# Patient Record
Sex: Female | Born: 1965 | Race: White | Hispanic: No | Marital: Married | Smoking: Current every day smoker
Health system: Southern US, Community
[De-identification: ages and names within clinical notes are randomized; demographics above are authoritative.]

## PROBLEM LIST (undated history)

## (undated) DIAGNOSIS — E282 Polycystic ovarian syndrome: Secondary | ICD-10-CM

## (undated) DIAGNOSIS — R002 Palpitations: Secondary | ICD-10-CM

## (undated) DIAGNOSIS — E119 Type 2 diabetes mellitus without complications: Secondary | ICD-10-CM

## (undated) DIAGNOSIS — J45909 Unspecified asthma, uncomplicated: Secondary | ICD-10-CM

## (undated) DIAGNOSIS — Z720 Tobacco use: Secondary | ICD-10-CM

## (undated) DIAGNOSIS — G4730 Sleep apnea, unspecified: Secondary | ICD-10-CM

## (undated) DIAGNOSIS — Z8673 Personal history of transient ischemic attack (TIA), and cerebral infarction without residual deficits: Secondary | ICD-10-CM

## (undated) HISTORY — PX: TUMOR REMOVAL: SHX12

## (undated) HISTORY — DX: Personal history of transient ischemic attack (TIA), and cerebral infarction without residual deficits: Z86.73

## (undated) HISTORY — DX: Palpitations: R00.2

## (undated) HISTORY — DX: Type 2 diabetes mellitus without complications: E11.9

## (undated) HISTORY — DX: Polycystic ovarian syndrome: E28.2

## (undated) HISTORY — PX: BREAST BIOPSY: SHX20

## (undated) HISTORY — PX: TUBAL LIGATION: SHX77

## (undated) HISTORY — DX: Tobacco use: Z72.0

---

## 2000-11-21 ENCOUNTER — Emergency Department (HOSPITAL_COMMUNITY): Admission: EM | Admit: 2000-11-21 | Discharge: 2000-11-21 | Payer: Self-pay | Admitting: Emergency Medicine

## 2004-11-11 ENCOUNTER — Other Ambulatory Visit: Admission: RE | Admit: 2004-11-11 | Discharge: 2004-11-11 | Payer: Self-pay | Admitting: Obstetrics and Gynecology

## 2010-01-14 ENCOUNTER — Encounter: Payer: Self-pay | Admitting: Cardiology

## 2010-03-08 ENCOUNTER — Encounter: Payer: Self-pay | Admitting: Family Medicine

## 2010-04-02 ENCOUNTER — Encounter: Payer: Self-pay | Admitting: Cardiology

## 2010-04-21 DIAGNOSIS — R51 Headache: Secondary | ICD-10-CM | POA: Insufficient documentation

## 2010-04-21 DIAGNOSIS — R5383 Other fatigue: Secondary | ICD-10-CM

## 2010-04-21 DIAGNOSIS — R61 Generalized hyperhidrosis: Secondary | ICD-10-CM | POA: Insufficient documentation

## 2010-04-21 DIAGNOSIS — R5381 Other malaise: Secondary | ICD-10-CM | POA: Insufficient documentation

## 2010-04-21 DIAGNOSIS — R0602 Shortness of breath: Secondary | ICD-10-CM | POA: Insufficient documentation

## 2010-04-21 DIAGNOSIS — R42 Dizziness and giddiness: Secondary | ICD-10-CM | POA: Insufficient documentation

## 2010-04-21 DIAGNOSIS — R7309 Other abnormal glucose: Secondary | ICD-10-CM | POA: Insufficient documentation

## 2010-04-21 DIAGNOSIS — R002 Palpitations: Secondary | ICD-10-CM | POA: Insufficient documentation

## 2010-04-22 ENCOUNTER — Other Ambulatory Visit: Payer: Self-pay | Admitting: Cardiology

## 2010-04-22 ENCOUNTER — Encounter: Payer: Self-pay | Admitting: Cardiology

## 2010-04-22 ENCOUNTER — Ambulatory Visit (INDEPENDENT_AMBULATORY_CARE_PROVIDER_SITE_OTHER): Payer: BC Managed Care – PPO | Admitting: Cardiology

## 2010-04-22 DIAGNOSIS — F17200 Nicotine dependence, unspecified, uncomplicated: Secondary | ICD-10-CM

## 2010-04-22 DIAGNOSIS — R002 Palpitations: Secondary | ICD-10-CM

## 2010-04-22 DIAGNOSIS — R0602 Shortness of breath: Secondary | ICD-10-CM

## 2010-04-22 LAB — BASIC METABOLIC PANEL
BUN: 18 mg/dL (ref 6–23)
Creatinine, Ser: 0.7 mg/dL (ref 0.4–1.2)
Glucose, Bld: 87 mg/dL (ref 70–99)
Potassium: 4.2 mEq/L (ref 3.5–5.1)

## 2010-04-28 NOTE — Assessment & Plan Note (Signed)
Summary: palpitations/ref grewal [phone] pt [phone]   Visit Type:  Initial Consult Primary Provider:  Jeani Hawking, MD  CC:  palpitations.  History of Present Illness: The patient presents for evaluation of palpitations. She reports this starting in mid February. She had had infrequent palpitations prior to this. However, on that day she started having them much more frequently. She seemed in some respects to feel it all the time described as a strong heartbeat in her chest. She alternately describes it as a flutter. She did not associate this with activities. She did not have any frank syncope though she did feel somewhat lightheaded. She has not had this sensation before. She did see Dr. Vincente Poli and had a normal TSH and CBC. She has had some midsternal discomfort that was vague but no left arm or neck discomfort. This was a mild full feeling that she would notice somewhat constantly. In the days prior to the symptoms she had been exercising routinely without bringing on the symptoms. She has decreased exercise tolerance. She has not been having any shortness of breath, PND or orthopnea. She has not been exercising routinely since then.  She reports over the last few days the symptoms seem to have abated though not completely.  Of note the patient has had no prior cardiac workup although she did have a workup to include an MRI for apparent TIAs.  Current Medications (verified): 1)  Byetta 5 Mcg Pen 5 Mcg/0.57ml Soln (Exenatide) .... Inject Two Times A Day 2)  Proantho 470-30 Mg Caps (Misc Natural Products) .... As Needed For Asthma 3)  Aspirin 81 Mg Tabs (Aspirin) .... Take One Tablet Once Daily  Allergies (verified): No Known Drug Allergies  Past History:  Past Medical History: Polycystic ovarian syndrome Tobacco abuse TIAs  Past Surgical History: Tubal ligation  Family History: No early onset heart disease.  Social History: Tobacco Use - Yes. (Ongoing x 20  years, 1ppd) Married   Review of Systems       Light headedness.  Negative for all other systems.  Vital Signs:  Patient profile:   45 year old female Height:      66 inches Weight:      155 pounds BMI:     25.11 Pulse rate:   79 / minute Pulse rhythm:   regular BP sitting:   114 / 75  (left arm) Cuff size:   regular  Vitals Entered By: Judithe Modest CMA (April 22, 2010 1:56 PM)  Physical Exam  General:  Well developed, well nourished, in no acute distress. Head:  normocephalic and atraumatic Eyes:  PERRLA/EOM intact; conjunctiva and lids normal. Mouth:  Teeth, gums and palate normal. Oral mucosa normal. Neck:  Neck supple, no JVD. No masses, thyromegaly or abnormal cervical nodes. Chest Wall:  no deformities or breast masses noted Lungs:  Clear bilaterally to auscultation and percussion. Abdomen:  Bowel sounds positive; abdomen soft and non-tender without masses, organomegaly, or hernias noted. No hepatosplenomegaly. Msk:  Back normal, normal gait. Muscle strength and tone normal. Extremities:  No clubbing or cyanosis. Neurologic:  Alert and oriented x 3. Skin:  Intact without lesions or rashes. Cervical Nodes:  no significant adenopathy Inguinal Nodes:  no significant adenopathy Psych:  Normal affect.   Detailed Cardiovascular Exam  Neck    Carotids: Carotids full and equal bilaterally without bruits.      Neck Veins: Normal, no JVD.    Heart    Inspection: no deformities or lifts noted.  Palpation: normal PMI with no thrills palpable.      Auscultation: regular rate and rhythm, S1, S2 without murmurs, rubs, gallops, or clicks.    Vascular    Abdominal Aorta: no palpable masses, pulsations, or audible bruits.      Femoral Pulses: normal femoral pulses bilaterally.      Pedal Pulses: normal pedal pulses bilaterally.      Radial Pulses: normal radial pulses bilaterally.      Peripheral Circulation: no clubbing, cyanosis, or edema noted with normal capillary  refill.     EKG  Procedure date:  04/22/2010  Findings:      sinus rhythm, rate 79, axis within normal limits, intervals within normal limits, no acute ST-T wave changes  Impression & Recommendations:  Problem # 1:  PALPITATIONS (ICD-785.1) The etiology of this is not clear. We discussed wearing an event monitor. However, she doesn't think the symptoms are frequent enough or that they last longer and off to necessarily be captured. Given her previous TIAs and this report I would like to check an echocardiogram to make sure she has a structurally normal heart. I will also add magnesium and basic metabolic profile to her labs. I will then see her back to further discuss any increasing palpitations. Orders: TLB-BMP (Basic Metabolic Panel-BMET) (80048-METABOL) TLB-Magnesium (Mg) (83735-MG) Echocardiogram (Echo)  Problem # 2:  TOBACCO ABUSE (ICD-305.1) She did stop smoking in the past with Chantix and tolerated this. Therefore, I gave her another prescription for this medication.  Patient Instructions: 1)  Your physician recommends that you schedule a follow-up appointment in: one month. 2)  Your physician has requested that you have an echocardiogram.  Echocardiography is a painless test that uses sound waves to create images of your heart. It provides your doctor with information about the size and shape of your heart and how well your heart's chambers and valves are working.  This procedure takes approximately one hour. There are no restrictions for this procedure. 3)  Your physician recommends that you return for lab work in today. BMET Magnesium level. Prescriptions: CHANTIX STARTING MONTH PAK 0.5 MG X 11 & 1 MG X 42 TABS (VARENICLINE TARTRATE) use as directed  #1 x 0   Entered by:   Ollen Gross, RN, BSN   Authorized by:   Rollene Rotunda, MD, [facility]   Signed by:   Ollen Gross, RN, BSN on 04/22/2010   Method used:   Electronically to        [facility] (retail)       [address]       [place], [place]  [address]       Ph: [phone]       Fax: [phone]   RxID:   [id number]  I have reviewed and approved all prescriptions at the time of this visit. Rollene Rotunda, MD, [facility]  April 22, 2010 3:58 PM

## 2010-05-01 ENCOUNTER — Ambulatory Visit (HOSPITAL_COMMUNITY): Payer: BC Managed Care – PPO | Attending: Cardiology

## 2010-05-01 ENCOUNTER — Other Ambulatory Visit: Payer: Self-pay | Admitting: Cardiology

## 2010-05-01 DIAGNOSIS — E119 Type 2 diabetes mellitus without complications: Secondary | ICD-10-CM | POA: Insufficient documentation

## 2010-05-01 DIAGNOSIS — Z8673 Personal history of transient ischemic attack (TIA), and cerebral infarction without residual deficits: Secondary | ICD-10-CM | POA: Insufficient documentation

## 2010-05-01 DIAGNOSIS — R42 Dizziness and giddiness: Secondary | ICD-10-CM | POA: Insufficient documentation

## 2010-05-01 DIAGNOSIS — R002 Palpitations: Secondary | ICD-10-CM | POA: Insufficient documentation

## 2010-05-05 NOTE — Consult Note (Signed)
Summary: [facility]  [facility]   Imported By: Marylou Mccoy 04/29/2010 08:15:18  _____________________________________________________________________  External Attachment:    Type:   Image     Comment:   External Document

## 2010-05-27 ENCOUNTER — Encounter: Payer: Self-pay | Admitting: Cardiology

## 2010-05-31 ENCOUNTER — Encounter: Payer: Self-pay | Admitting: Cardiology

## 2010-05-31 DIAGNOSIS — Z720 Tobacco use: Secondary | ICD-10-CM | POA: Insufficient documentation

## 2010-06-01 ENCOUNTER — Ambulatory Visit (INDEPENDENT_AMBULATORY_CARE_PROVIDER_SITE_OTHER): Payer: BC Managed Care – PPO | Admitting: Cardiology

## 2010-06-01 ENCOUNTER — Encounter: Payer: Self-pay | Admitting: Cardiology

## 2010-06-01 VITALS — BP 118/78 | HR 69 | Resp 18 | Ht 65.0 in | Wt 154.1 lb

## 2010-06-01 DIAGNOSIS — R002 Palpitations: Secondary | ICD-10-CM

## 2010-06-01 DIAGNOSIS — R0602 Shortness of breath: Secondary | ICD-10-CM

## 2010-06-01 DIAGNOSIS — F17200 Nicotine dependence, unspecified, uncomplicated: Secondary | ICD-10-CM

## 2010-06-01 MED ORDER — METOPROLOL TARTRATE 25 MG PO TABS: ORAL_TABLET | ORAL | Status: DC

## 2010-06-01 NOTE — Assessment & Plan Note (Signed)
I do note that she has a bronchodilator which is not wheezing and has not used this recently. She should tolerate a low dose of beta blocker.

## 2010-06-01 NOTE — Assessment & Plan Note (Signed)
She is taking Chantix with some success and I continued to encourage efforts toward complete abstinence.

## 2010-06-01 NOTE — Assessment & Plan Note (Signed)
We discussed symptomatic treatment. No further testing is indicated. I will give her metoprolol immediately release 12.5 mg to be taken as needed.

## 2010-06-01 NOTE — Progress Notes (Signed)
HPI The patient returns for followup of palpitations. Since I saw her I did check a chemistry and magnesium which were normal. Echo was normal. She still has the palpitations and has had and sometimes at night. She does relate them to stress. She denies any presyncope or syncope. She's not having any chest pressure, neck or arm discomfort. There is no new shortness of breath, PND or orthopnea. She said no weight gain or edema.  No Known Allergies  Current Outpatient Prescriptions  Medication Sig Dispense Refill  . albuterol (PROAIR HFA) 108 (90 BASE) MCG/ACT inhaler Inhale 2 puffs into the lungs every 6 (six) hours as needed.        [name] aspirin 81 MG tablet Take 81 mg by mouth daily.        [name] exenatide (BYETTA 5 MCG PEN) 5 MCG/0.02ML SOLN Inject into the skin 2 (two) times daily with a meal.        . Misc Natural Products (PROANTHO) 470-30 MG CAPS Take by mouth as needed.          Past Medical History  Diagnosis Date  . Polycystic ovarian disease   . History of TIAs   . Palpitations   . Tobacco abuse     Past Surgical History  Procedure Date  . Tubal ligation    ROS   As stated in the HPI and negative for all other systems.   PHYSICAL EXAM BP 118/78  Pulse 69  Resp 18  Ht 5\' 5"  (1.651 m)  Wt 154 lb 1.9 oz (69.908 kg)  BMI 25.65 kg/m2 GENERAL:  Well appearing HEENT:  Pupils equal round and reactive, fundi not visualized, oral mucosa unremarkable NECK:  No jugular venous distention, waveform within normal limits, carotid upstroke brisk and symmetric, no bruits, no thyromegaly LYMPHATICS:  No cervical, inguinal adenopathy LUNGS:  Clear to auscultation bilaterally BACK:  No CVA tenderness CHEST:  Unremarkable HEART:  PMI not displaced or sustained,S1 and S2 within normal limits, no S3, no S4, no clicks, no rubs, no murmurs ABD:  Flat, positive bowel sounds normal in frequency in pitch, no bruits, no rebound, no guarding, no midline pulsatile mass, no hepatomegaly, no  splenomegaly EXT:  2 plus pulses throughout, no edema, no cyanosis no clubbing SKIN:  No rashes no nodules NEURO:  Cranial nerves II through XII grossly intact, motor grossly intact throughout PSYCH:  Cognitively intact, oriented to person place and time  EKG:  Sinus rhythm, rate 70, axis within normal limits, RSR prime V1, no acute ST-T wave changes  ASSESSMENT AND PLAN

## 2010-06-01 NOTE — Patient Instructions (Addendum)
Your physician recommends that you schedule a follow-up appointment as needed.   Your physician has recommended you make the following change in your medication: START Metoprolol tartrate 25mg  take one-half tablet by mouth up to 4 times a day as needed for palpitations

## 2011-03-29 ENCOUNTER — Other Ambulatory Visit: Payer: Self-pay | Admitting: Obstetrics and Gynecology

## 2011-03-29 DIAGNOSIS — R928 Other abnormal and inconclusive findings on diagnostic imaging of breast: Secondary | ICD-10-CM

## 2011-03-30 ENCOUNTER — Ambulatory Visit
Admission: RE | Admit: 2011-03-30 | Discharge: 2011-03-30 | Disposition: A | Discharge status: Home / Self Care | Payer: BC Managed Care – PPO | Source: Ambulatory Visit | Attending: Obstetrics and Gynecology | Admitting: Obstetrics and Gynecology

## 2011-03-30 DIAGNOSIS — R928 Other abnormal and inconclusive findings on diagnostic imaging of breast: Secondary | ICD-10-CM

## 2011-04-28 ENCOUNTER — Ambulatory Visit (INDEPENDENT_AMBULATORY_CARE_PROVIDER_SITE_OTHER): Payer: BC Managed Care – PPO | Admitting: General Surgery

## 2014-05-28 ENCOUNTER — Other Ambulatory Visit: Payer: Self-pay | Admitting: Endocrinology

## 2014-05-28 DIAGNOSIS — E010 Iodine-deficiency related diffuse (endemic) goiter: Secondary | ICD-10-CM

## 2014-05-31 ENCOUNTER — Ambulatory Visit
Admission: RE | Admit: 2014-05-31 | Discharge: 2014-05-31 | Disposition: A | Discharge status: Home / Self Care | Payer: Managed Care, Other (non HMO) | Source: Ambulatory Visit | Attending: Endocrinology | Admitting: Endocrinology

## 2014-05-31 DIAGNOSIS — E010 Iodine-deficiency related diffuse (endemic) goiter: Secondary | ICD-10-CM

## 2014-06-04 ENCOUNTER — Other Ambulatory Visit: Payer: Self-pay | Admitting: Endocrinology

## 2014-06-05 ENCOUNTER — Other Ambulatory Visit: Payer: Self-pay | Admitting: Endocrinology

## 2014-06-05 DIAGNOSIS — E010 Iodine-deficiency related diffuse (endemic) goiter: Secondary | ICD-10-CM

## 2014-06-05 DIAGNOSIS — E041 Nontoxic single thyroid nodule: Secondary | ICD-10-CM

## 2014-06-06 ENCOUNTER — Other Ambulatory Visit (HOSPITAL_COMMUNITY)
Admission: RE | Admit: 2014-06-06 | Discharge: 2014-06-06 | Disposition: A | Discharge status: Home / Self Care | Payer: Managed Care, Other (non HMO) | Source: Ambulatory Visit | Attending: Interventional Radiology | Admitting: Interventional Radiology

## 2014-06-06 ENCOUNTER — Ambulatory Visit
Admission: RE | Admit: 2014-06-06 | Discharge: 2014-06-06 | Disposition: A | Discharge status: Home / Self Care | Payer: Managed Care, Other (non HMO) | Source: Ambulatory Visit | Attending: Endocrinology | Admitting: Endocrinology

## 2014-06-06 DIAGNOSIS — E041 Nontoxic single thyroid nodule: Secondary | ICD-10-CM | POA: Insufficient documentation

## 2014-06-06 DIAGNOSIS — E010 Iodine-deficiency related diffuse (endemic) goiter: Secondary | ICD-10-CM

## 2014-08-13 ENCOUNTER — Other Ambulatory Visit: Payer: Self-pay | Admitting: Endocrinology

## 2014-08-13 DIAGNOSIS — E049 Nontoxic goiter, unspecified: Secondary | ICD-10-CM

## 2015-01-24 ENCOUNTER — Other Ambulatory Visit: Payer: Self-pay | Admitting: Endocrinology

## 2015-01-24 DIAGNOSIS — D3501 Benign neoplasm of right adrenal gland: Secondary | ICD-10-CM

## 2015-01-29 ENCOUNTER — Other Ambulatory Visit: Payer: Managed Care, Other (non HMO)

## 2015-02-12 ENCOUNTER — Ambulatory Visit
Admission: RE | Admit: 2015-02-12 | Discharge: 2015-02-12 | Disposition: A | Discharge status: Home / Self Care | Payer: Managed Care, Other (non HMO) | Source: Ambulatory Visit | Attending: Endocrinology | Admitting: Endocrinology

## 2015-02-12 DIAGNOSIS — D3501 Benign neoplasm of right adrenal gland: Secondary | ICD-10-CM

## 2015-02-12 DIAGNOSIS — E049 Nontoxic goiter, unspecified: Secondary | ICD-10-CM

## 2015-02-18 ENCOUNTER — Other Ambulatory Visit: Payer: Self-pay | Admitting: Endocrinology

## 2015-02-18 DIAGNOSIS — E049 Nontoxic goiter, unspecified: Secondary | ICD-10-CM

## 2016-02-13 ENCOUNTER — Ambulatory Visit
Admission: RE | Admit: 2016-02-13 | Discharge: 2016-02-13 | Disposition: A | Discharge status: Home / Self Care | Payer: Managed Care, Other (non HMO) | Source: Ambulatory Visit | Attending: Endocrinology | Admitting: Endocrinology

## 2016-02-13 DIAGNOSIS — E049 Nontoxic goiter, unspecified: Secondary | ICD-10-CM

## 2016-03-02 ENCOUNTER — Telehealth: Payer: Managed Care, Other (non HMO) | Admitting: Family

## 2016-03-02 DIAGNOSIS — R05 Cough: Secondary | ICD-10-CM

## 2016-03-02 DIAGNOSIS — J209 Acute bronchitis, unspecified: Secondary | ICD-10-CM

## 2016-03-02 DIAGNOSIS — R059 Cough, unspecified: Secondary | ICD-10-CM

## 2016-03-02 MED ORDER — BENZONATATE 100 MG PO CAPS: 100.0000 mg | ORAL_CAPSULE | Freq: Two times a day (BID) | ORAL | 0 refills | Status: DC | PRN

## 2016-03-02 MED ORDER — ALBUTEROL SULFATE HFA 108 (90 BASE) MCG/ACT IN AERS: 2.0000 | INHALATION_SPRAY | Freq: Four times a day (QID) | RESPIRATORY_TRACT | 1 refills | Status: DC | PRN

## 2016-03-02 NOTE — Addendum Note (Signed)
Addended by: Chevis Pretty on: 03/02/2016 05:28 PM   Modules accepted: Orders

## 2016-03-02 NOTE — Progress Notes (Signed)
We are sorry that you are not feeling well.  Here is how we plan to help!  Based on what you have shared with me it looks like you have upper respiratory tract inflammation that has resulted in a significant cough.  Inflammation and infection in the upper respiratory tract is commonly called bronchitis and has four common causes:  Allergies, Viral Infections, Acid Reflux and Bacterial Infections.  Allergies, viruses and acid reflux are treated by controlling symptoms or eliminating the cause. An example might be a cough caused by taking certain blood pressure medications. You stop the cough by changing the medication. Another example might be a cough caused by acid reflux. Controlling the reflux helps control the cough.  Based on your presentation I believe you most likely have A cough due to a virus.  This is called viral bronchitis and is best treated by rest, plenty of fluids and control of the cough.  You may use Ibuprofen or Tylenol as directed to help your symptoms.     In addition you may use A prescription cough medication called Tessalon Perles 100mg. You may take 1-2 capsules every 8 hours as needed for your cough.  USE OF BRONCHODILATOR ("RESCUE") INHALERS: There is a risk from using your bronchodilator too frequently.  The risk is that over-reliance on a medication which only relaxes the muscles surrounding the breathing tubes can reduce the effectiveness of medications prescribed to reduce swelling and congestion of the tubes themselves.  Although you feel brief relief from the bronchodilator inhaler, your asthma may actually be worsening with the tubes becoming more swollen and filled with mucus.  This can delay other crucial treatments, such as oral steroid medications. If you need to use a bronchodilator inhaler daily, several times per day, you should discuss this with your provider.  There are probably better treatments that could be used to keep your asthma under control.     HOME  CARE . Only take medications as instructed by your medical team. . Complete the entire course of an antibiotic. . Drink plenty of fluids and get plenty of rest. . Avoid close contacts especially the very young and the elderly . Cover your mouth if you cough or cough into your sleeve. . Always remember to wash your hands . A steam or ultrasonic humidifier can help congestion.   GET HELP RIGHT AWAY IF: . You develop worsening fever. . You become short of breath . You cough up blood. . Your symptoms persist after you have completed your treatment plan MAKE SURE YOU   Understand these instructions.  Will watch your condition.  Will get help right away if you are not doing well or get worse.  Your e-visit answers were reviewed by a board certified advanced clinical practitioner to complete your personal care plan.  Depending on the condition, your plan could have included both over the counter or prescription medications. If there is a problem please reply  once you have received a response from your provider. Your safety is important to us.  If you have drug allergies check your prescription carefully.    You can use MyChart to ask questions about today's visit, request a non-urgent call back, or ask for a work or school excuse for 24 hours related to this e-Visit. If it has been greater than 24 hours you will need to follow up with your provider, or enter a new e-Visit to address those concerns. You will get an e-mail in the next two days   asking about your experience.  I hope that your e-visit has been valuable and will speed your recovery. Thank you for using e-visits.   

## 2016-11-19 ENCOUNTER — Ambulatory Visit (INDEPENDENT_AMBULATORY_CARE_PROVIDER_SITE_OTHER): Payer: BLUE CROSS/BLUE SHIELD | Admitting: Family Medicine

## 2016-11-19 ENCOUNTER — Encounter: Payer: Self-pay | Admitting: Family Medicine

## 2016-11-19 VITALS — BP 120/82 | HR 77 | Temp 98.0°F | Resp 16 | Ht 66.0 in | Wt 180.0 lb

## 2016-11-19 DIAGNOSIS — M799 Soft tissue disorder, unspecified: Secondary | ICD-10-CM | POA: Diagnosis not present

## 2016-11-19 DIAGNOSIS — E1165 Type 2 diabetes mellitus with hyperglycemia: Secondary | ICD-10-CM

## 2016-11-19 DIAGNOSIS — F419 Anxiety disorder, unspecified: Secondary | ICD-10-CM | POA: Diagnosis not present

## 2016-11-19 DIAGNOSIS — J4521 Mild intermittent asthma with (acute) exacerbation: Secondary | ICD-10-CM | POA: Diagnosis not present

## 2016-11-19 DIAGNOSIS — J209 Acute bronchitis, unspecified: Secondary | ICD-10-CM

## 2016-11-19 DIAGNOSIS — M7989 Other specified soft tissue disorders: Secondary | ICD-10-CM

## 2016-11-19 MED ORDER — ALBUTEROL SULFATE HFA 108 (90 BASE) MCG/ACT IN AERS: 2.0000 | INHALATION_SPRAY | Freq: Four times a day (QID) | RESPIRATORY_TRACT | 1 refills | Status: DC | PRN

## 2016-11-19 MED ORDER — CITALOPRAM HYDROBROMIDE 20 MG PO TABS: 20.0000 mg | ORAL_TABLET | Freq: Every day | ORAL | 0 refills | Status: DC

## 2016-11-19 MED ORDER — AMOXICILLIN-POT CLAVULANATE 875-125 MG PO TABS: 1.0000 | ORAL_TABLET | Freq: Two times a day (BID) | ORAL | 0 refills | Status: DC

## 2016-11-19 MED ORDER — METFORMIN HCL 500 MG PO TABS
500.0000 mg | ORAL_TABLET | Freq: Two times a day (BID) | ORAL | 0 refills | Status: DC
Start: 2016-11-19 — End: 2017-05-07

## 2016-11-19 NOTE — Patient Instructions (Signed)
Celexa (for anxiety) citalapram 20 mg tablet, take 1/2 a day for 2 weeks and then increase to 1 a day if still have anxiety   Metformin 500 mg, take 1 pill each morning for 2 weeks then increase to 1 in the morning and 1 in the evening TAKE WITH FOOD

## 2016-11-19 NOTE — Progress Notes (Signed)
Patient ID: Gwendolyn [name], female    DOB: Mar 22, 1965, 51 y.o.   MRN: [id number]  Chief Complaint  Patient presents with  . Cyst    knot on leg, left thigh  . Other    allergies, URI?  [name] Anxiety    Allergies Patient has no known allergies.  Subjective:   Gwendolyn [name] is a 51 y.o. female who presents to [facility] today.  HPI Here as a new patient to establish care. Has several concerns today that she would like addressed.  Area on her left upper thigh for the past several weeks that she is concerned about. Reports that she first noticed the area was a bit sore. Reports that the area is achy in quality and nothing seems to make it better or worse. Patient reports that she feels like the area is enlarged and it hurts at times, but not constant. Pain is a 3-4 out of 10. Patient reports that she can walk normal and has good strength in her leg. Denies any numbness or tingling in the leg or thigh. Denies any trauma to the area. Has never had this in the past. Has not taken any medications or sought any treatment for this. Nothing seems to make it better or worse. The area has not gotten larger or smaller over the past couple weeks. Patient reports that she is worried that it could be a blood clot.  Patient reports that she has had a cough for the past 2-3 weeks. Cough is productive of greenish sputum. Reports that she feels congested in the mornings and the mucus makes her feel nauseated. Reports that she feels winded at times over the past couple weeks with exertion and has a history of asthma. Has been out of her inhaler. Usually does not need to use the inhaler unless has an upper respiratory infection.  Reports that she coughs frequently. Can tell that she is wheezing at times. Has had postnasal drip and bilateral sinus pain. Feels like she might have fevers at times, but has not checked her temperature. Has not been on any recent antibiotics. Patient denies any shortness  of breath at this time. Energy has been somewhat low. Denies weight loss. Patient has a smoking history. She is currently not ready to quit smoking.  Patient reports that over the past 6 months she has been struggling with anxiety. Reports she has never been anxious in her life until about 6 months ago. Reports many years ago was put on Prozac for her mood but then decided she did not want to take it. Patient reports she has had [place] amount of stress at work and is currently looking for a new job location. Reports that works in a stressful environment and does not get along with all of her coworkers. Reports that feels anxious and on edge most of the day. Can get tearful and very emotional at times. Not feel sad. Denies any suicidal or homicidal ideations. Denies any history of previous suicidal ideations. Reports that she has trouble sleeping at times. Reports that occasionally will have some wine at night to help her sleep and to feel more relaxed. Reports she has good social support. Reports that he does not feel depressed but does feel very anxious. Denies any drug use.  Patient reports that she has a history of diabetes type 2 but has not been on her medications for many months now. Reports that she was seen by her gynecologist and referred  to an endocrinologist. She believes her highest A1c was 8.0%. She reports that she was told she had polycystic ovarian syndrome and that metformin was not working well for her. She only used it for a very short time and then was placed on Byetta injections. Patient reports that she is unable to afford the Byetta and needs a much cheaper option. Patient reports that she is willing to work on her diet and exercise. Reports last A1c was around 6.5%. Denies any diabetic complications related to nerve or eyes. Denies any lesions on her feet. At this time would like to be followed here for diabetes because does not want to drive back and forth to [place]. Denies any  hypoglycemic episodes. Reports that she has not been on any cholesterol or blood pressure medications in the past.  No known drug allergies.     Past Medical History:  Diagnosis Date  . Diabetes mellitus without complication ([place])   . History of TIAs   . Palpitations   . Polycystic ovarian disease   . Tobacco abuse     Past Surgical History:  Procedure Laterality Date  . TUBAL LIGATION      Family History  Problem Relation Age of Onset  . Hypertension Mother   . Diabetes Father   . Diabetes Brother      Social History   Social History  . Marital status: Divorced    Spouse name: N/A  . Number of children: N/A  . Years of education: N/A   Social History Main Topics  . Smoking status: Current Every Day Smoker    Packs/day: 0.25    Years: 20.00  . Smokeless tobacco: Never Used  . Alcohol use 3.0 oz/week    5 Glasses of wine per week  . Drug use: No  . Sexual activity: Not Currently    Birth control/ protection: Post-menopausal   Other Topics Concern  . None   Social History Narrative  . None    Review of Systems  Constitutional: Positive for fatigue and fever. Negative for activity change and appetite change.  HENT: Positive for sinus pain and sinus pressure. Negative for dental problem, ear pain, facial swelling, sneezing, sore throat, trouble swallowing and voice change.   Eyes: Negative for visual disturbance.  Respiratory: Positive for cough and chest tightness. Negative for shortness of breath.        Denies any current chest tightness or shortness of breath.  Cardiovascular: Negative for chest pain, palpitations and leg swelling.  Gastrointestinal: Negative for abdominal pain, nausea and vomiting.  Genitourinary: Negative for dysuria, frequency and urgency.  Musculoskeletal: Negative for back pain, gait problem and joint swelling.       Episodic pain in left upper thigh region.  Neurological: Negative for dizziness, seizures, syncope, speech  difficulty, light-headedness and numbness.  Hematological: Negative for adenopathy.  Psychiatric/Behavioral: Positive for agitation, dysphoric mood and sleep disturbance. Negative for confusion, decreased concentration, hallucinations, self-injury and suicidal ideas. The patient is nervous/anxious.      Objective:   BP 120/82 (BP Location: Left Arm, Patient Position: Sitting, Cuff Size: Normal)   Pulse 77   Temp 98 F (36.7 C) (Other (Comment))   Resp 16   Ht 5\' 6"  (1.676 m)   Wt 180 lb (81.6 kg)   LMP 03/30/2011   SpO2 98%   BMI 29.05 kg/m   Physical Exam  Constitutional: She is oriented to person, place, and time. She appears well-developed and well-nourished. No distress.  HENT:  Head:  Normocephalic and atraumatic.  Mouth/Throat: Uvula is midline, oropharynx is clear and moist and mucous membranes are normal. Mucous membranes are not pale and not dry. No oral lesions. No posterior oropharyngeal edema or posterior oropharyngeal erythema.  Eyes: Pupils are equal, round, and reactive to light. Conjunctivae and lids are normal. Right conjunctiva is not injected. Left conjunctiva is not injected.  Neck: Normal range of motion. Neck supple. No neck rigidity. No edema and normal range of motion present. No thyromegaly present.  Cardiovascular: Normal rate, regular rhythm, normal heart sounds and normal pulses.   Pulmonary/Chest: Effort normal. No respiratory distress. She has no decreased breath sounds. She has wheezes in the right lower field and the left lower field. She has rhonchi in the right lower field, the left middle field and the left lower field.  Musculoskeletal:       Left upper leg: She exhibits tenderness. She exhibits no swelling, no edema and no laceration.       Legs: Approximate 10 cm palpable lesion in left upper medial thigh with indiscriminate borders. Tender slightly to palpation. No skin dimpling. No associated skin erythema or induration.  Neurological: She is  alert and oriented to person, place, and time. She has normal strength. She displays no atrophy. No cranial nerve deficit or sensory deficit. She exhibits normal muscle tone.  Reflex Scores:      Patellar reflexes are 2+ on the right side and 2+ on the left side. Skin: Skin is warm and dry. No abrasion, no bruising, no ecchymosis and no rash noted.  Psychiatric: Her speech is normal and behavior is normal. Judgment and thought content normal. Her mood appears anxious. Her affect is not inappropriate. Cognition and memory are normal. She does not exhibit a depressed mood. She expresses no homicidal and no suicidal ideation. She expresses no suicidal plans and no homicidal plans.  Nursing note and vitals reviewed.    Assessment and Plan   1. Anxiety, uncontrolled New-onset, suspect exacerbated by work stressors. Discussed medication options in detail. Discussed risks versus benefits of medications. We discussed the side effects. Patient is agreeable and would like to start the medication. We'll start at Celexa 10 mg by mouth daily for 2 weeks and then increase to 1 tablet by mouth daily. Patient told to call with any questions or concerned. Patient denies any suicidal ideations. - citalopram (CELEXA) 20 MG tablet; Take 1 tablet (20 mg total) by mouth daily.  Dispense: 30 tablet; Refill: 0 Suicide risks evaluated and documented in note if present or in the area below.  Patient does not have/denies the following risks: previous suicide attempts, family history of suicide, access to lethal means, prior history of psychiatric disorder, history of alcohol or substance abuse disorder, recent loss of a loved one, or severe hopelessness.Patient has protective factors of family and community support.  Patient reports that family believes is behaving rationally. Patient displays problem solving skills.   Patient specifically denies suicide ideation. Patient has access/information to healthcare contacts if  situation or mood changes where patient is a risk to self or others or mood becomes unstable.   During the encounter, the patient had good eye contact and firm handshake regarding safety contract and agreement to seek help if mood worsens and not to harm self.  Discussed relaxation techniques. Discussed sleep schedule and ways to improve sleep hygiene. Exercise recommended after resolution of bronchitis episode.  2. Soft tissue mass Uncertain etiology. Will start with ultrasound of the area and pending results  of study we'll either refer for evaluation or obtain MRI. Patient counseled that this area was not a venous thrombosis. All of her questions were answered. - US SOFT TISSUE UPPER EXTREMITY LIMITED LEFT (NON-VASCULAR); Future  3. Type 2 diabetes mellitus with hyperglycemia, without long-term current use of insulin (HCC) Larene Beach has been off of medications for extended duration of time due to financial constraints. At this time will discontinue Byetta and start metformin. Patient will titrate up metformin as directed. She will start taking metformin 500 mg 1 each day for 2 weeks and then increase to twice a day. Patient counseled concerning the risks of this medication. Diabetic diet recommended. Patient defers nutritionist at this time. - metFORMIN (GLUCOPHAGE) 500 MG tablet; Take 1 tablet (500 mg total) by mouth 2 (two) times daily with a meal.  Dispense: 180 tablet; Refill: 0 - Basic metabolic panel - Hemoglobin A1c Check labs today. We discussed that a follow-up in later office visits that patient will need lipid tests, foot exam, routine diabetic eye exam, and it is recommended for her to lose weight. 4. Mild intermittent asthma with acute exacerbation Patient counseled concerning worrisome signs of breathing problems. She will use her inhaler as directed and keep her follow-up. Smoking cessation recommended - albuterol (PROAIR HFA) 108 (90 Base) MCG/ACT inhaler; Inhale 2 puffs into the  lungs every 6 (six) hours as needed.  Dispense: 1 Inhaler; Refill: 1  5. Acute bronchitis, unspecified organism Patient encouraged to take the antibiotic as directed. She was counseled concerning worrisome signs of worsening infection. Told that if symptoms had not improved  that she needed to follow-up in 1-2 weeks or sooner if needed. Patient has agreed that until she completes the antibiotic she will not start the Celexa for her mood. - amoxicillin-clavulanate (AUGMENTIN) 875-125 MG tablet; Take 1 tablet by mouth 2 (two) times daily.  Dispense: 20 tablet; Refill: 0 . Patient was given a note today saying that she should not take the flu vaccine until this medical condition resolves. In addition it is recommended that patient not return to work today but go home and rest. Her skin chart.  No Follow-up on file. Caren Macadam, MD 11/19/2016

## 2016-11-24 ENCOUNTER — Telehealth: Payer: Self-pay | Admitting: Family Medicine

## 2016-11-24 ENCOUNTER — Telehealth: Payer: Self-pay

## 2016-11-24 NOTE — Telephone Encounter (Signed)
She needs to be seen, have her come at 1400 today if possible

## 2016-11-24 NOTE — Telephone Encounter (Signed)
Labs ordered can go have done.

## 2016-11-24 NOTE — Telephone Encounter (Signed)
Patient requesting steroid.  She feels the antibiotic that she has been since this past Friday is not working.  Please call in @ [facility].    Pt is scheduled for [place] @ [facility] tomorrow, but she has not heard anything about her lab work.  She states Dr Mannie Stabile had mentioned A1C.

## 2016-11-24 NOTE — Telephone Encounter (Signed)
Please advise patient that is she is not feeling better that she needs to be seen and evaluated. I do not call in steroids without evaluating a patient. Steroids are serious medications. Advise that labs were ordered the day that she was here and she was supposed to get them that day. She can come in for the labs. Gwen Her. Mannie Stabile, MD

## 2016-11-25 ENCOUNTER — Ambulatory Visit (HOSPITAL_COMMUNITY)
Admission: RE | Admit: 2016-11-25 | Discharge: 2016-11-25 | Disposition: A | Discharge status: Home / Self Care | Payer: BLUE CROSS/BLUE SHIELD | Source: Ambulatory Visit | Attending: Family Medicine | Admitting: Family Medicine

## 2016-11-25 DIAGNOSIS — M799 Soft tissue disorder, unspecified: Secondary | ICD-10-CM | POA: Insufficient documentation

## 2016-11-25 DIAGNOSIS — M7989 Other specified soft tissue disorders: Secondary | ICD-10-CM

## 2016-11-25 NOTE — Telephone Encounter (Signed)
Patient informed of message below, verbalized understanding.  

## 2016-11-29 ENCOUNTER — Telehealth: Payer: Self-pay | Admitting: Family Medicine

## 2016-11-29 NOTE — Telephone Encounter (Signed)
-----   Message from Caren Macadam, MD sent at 11/26/2016  8:59 AM EDT ----- Please call the patient and advise that the ultrasound did note reveal any cystic or solid abnormality. They did not identify any mass lesion. Please advise that we will discuss at her follow up whether we refer her to specialist or proceed with MRI. I would recommend MRI. If she would like to proceed with that prior to her visit, let me know and we can order. Gwen Her. Mannie Stabile, MD

## 2016-11-29 NOTE — Telephone Encounter (Signed)
Called patient regarding message below. No answer, left generic message for patient to return call.   

## 2016-12-01 ENCOUNTER — Ambulatory Visit: Payer: BLUE CROSS/BLUE SHIELD | Admitting: Family Medicine

## 2016-12-05 IMAGING — US US SOFT TISSUE HEAD/NECK
1 series · 14 of 25 positions shown · non-contrast
Comparison: 05/31/2014.  Biopsies from 06/06/2014

CLINICAL DATA: Follow-up of thyroid goiter and nodules.

EXAM:
THYROID ULTRASOUND
TECHNIQUE: Ultrasound examination of the thyroid gland and adjacent soft
tissues was performed.

[Series 1: us soft tissue head/neck · 0.08mm/px · 14 of 50 slices shown]
[im 1/50]
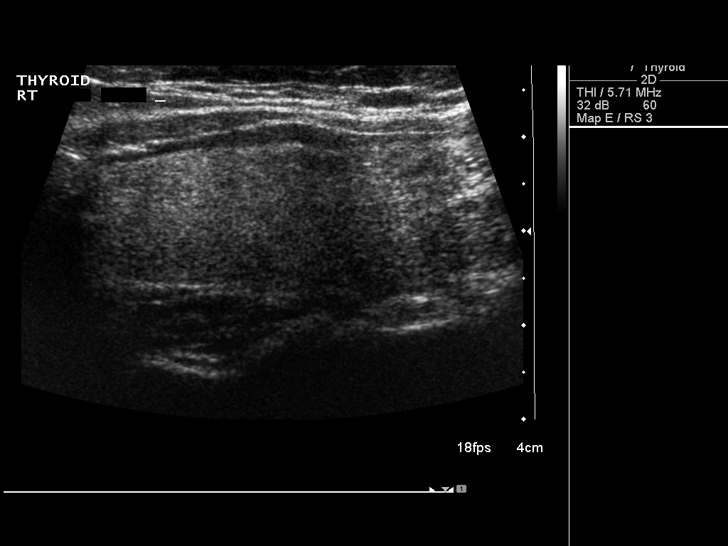
[im 5/50]
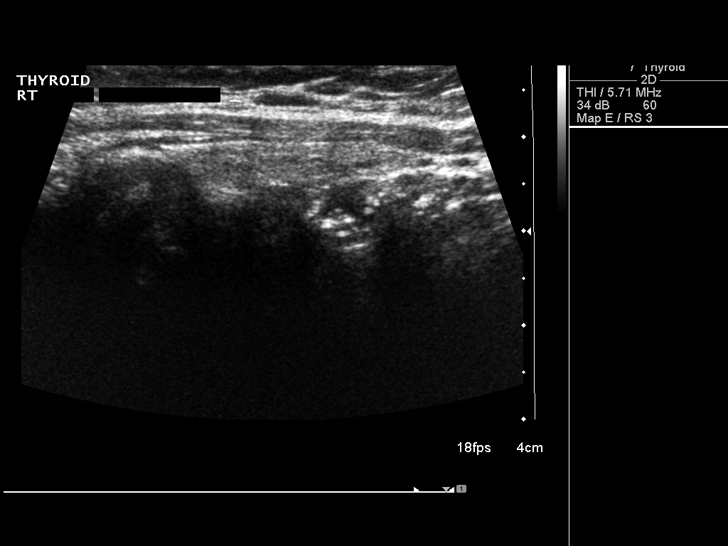
[im 9/50]
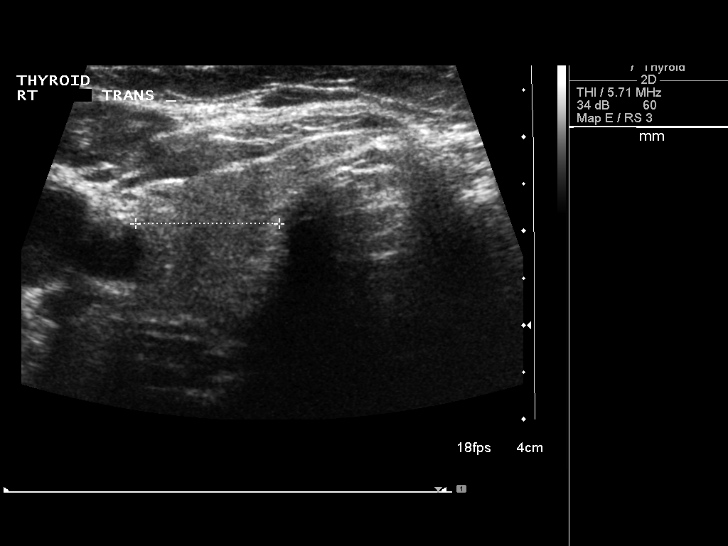
[im 13/50]
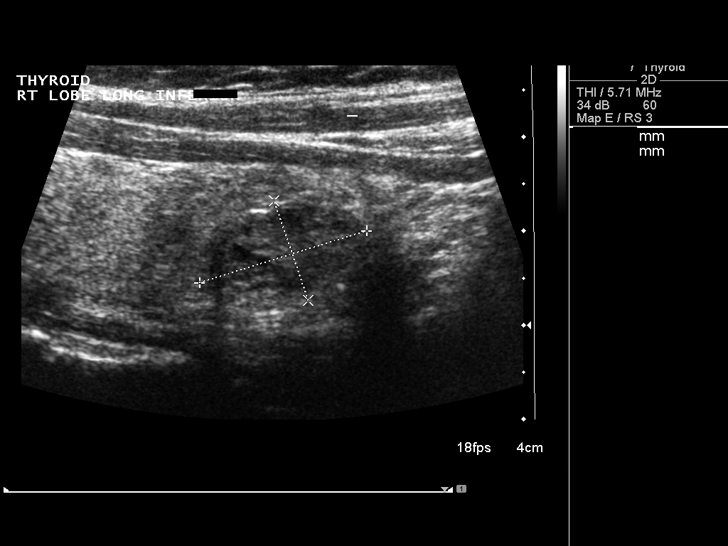
[im 17/50]
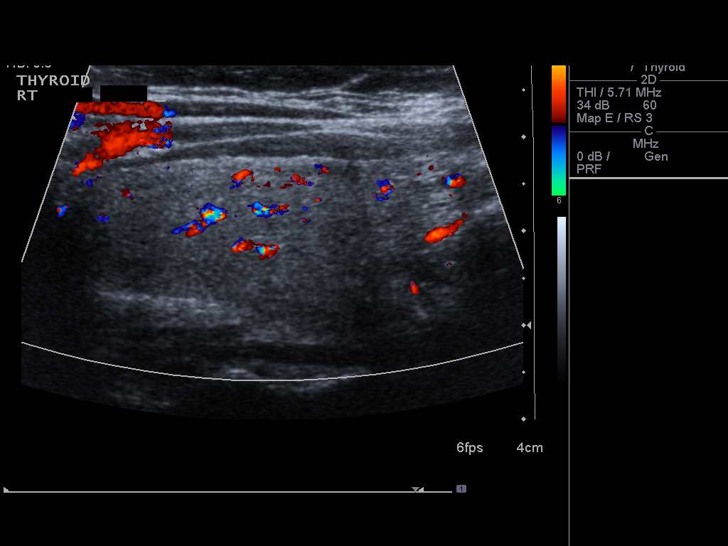
[im 19/50]
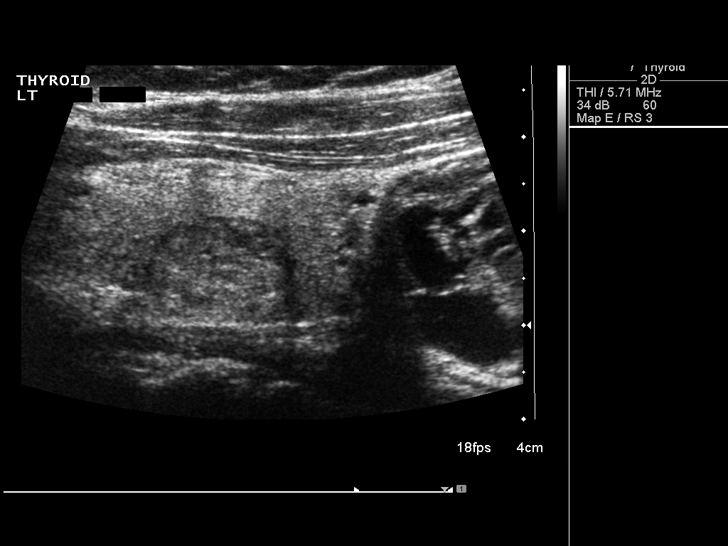
[im 23/50]
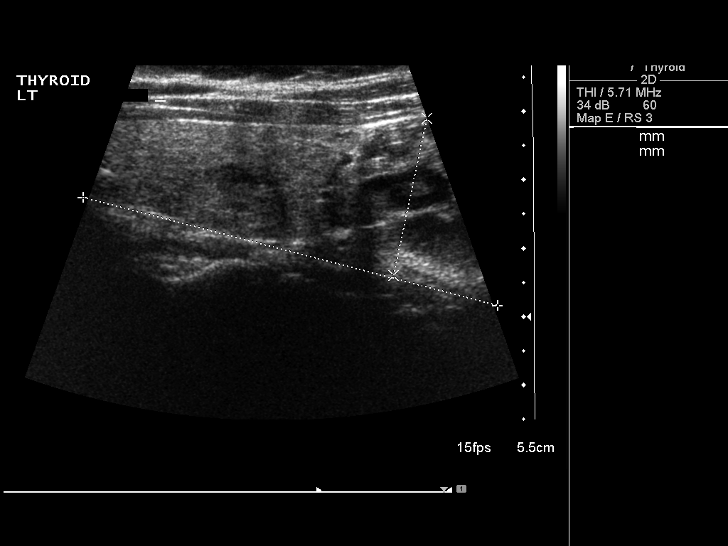
[im 27/50]
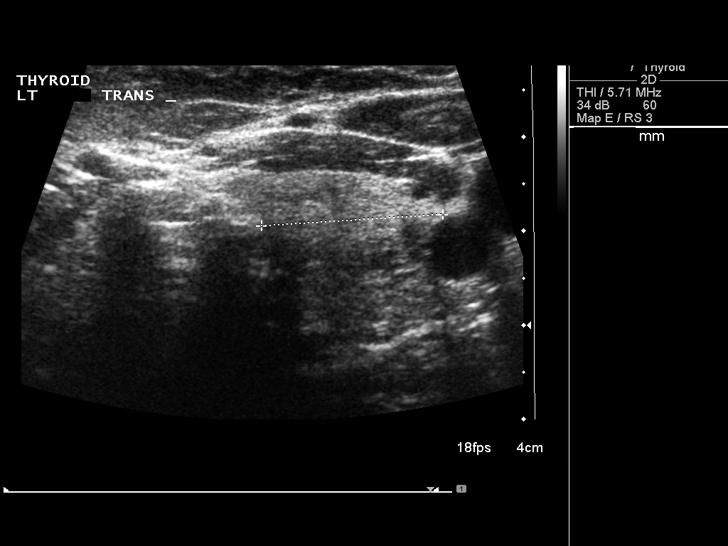
[im 31/50]
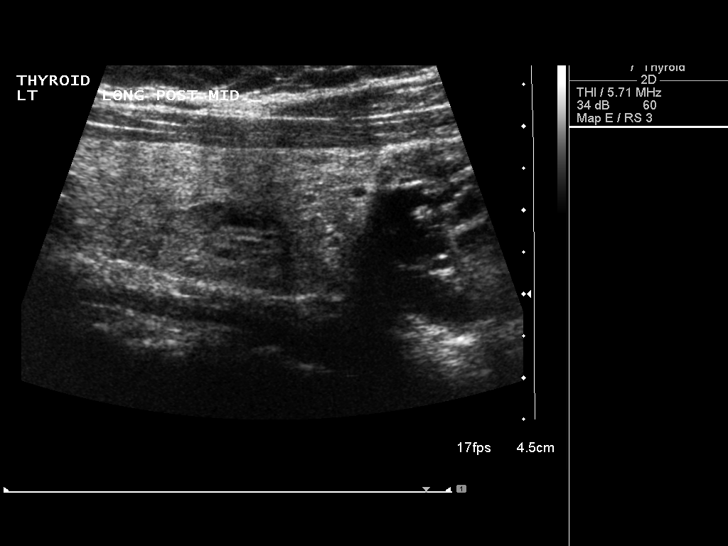
[im 33/50]
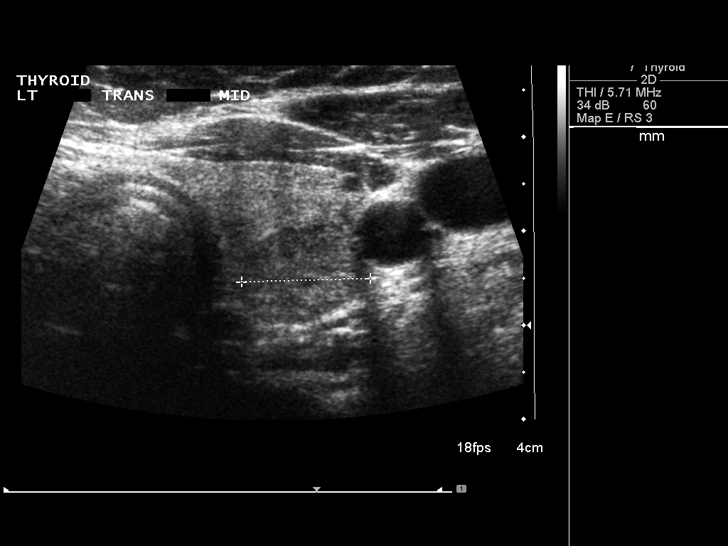
[im 37/50]
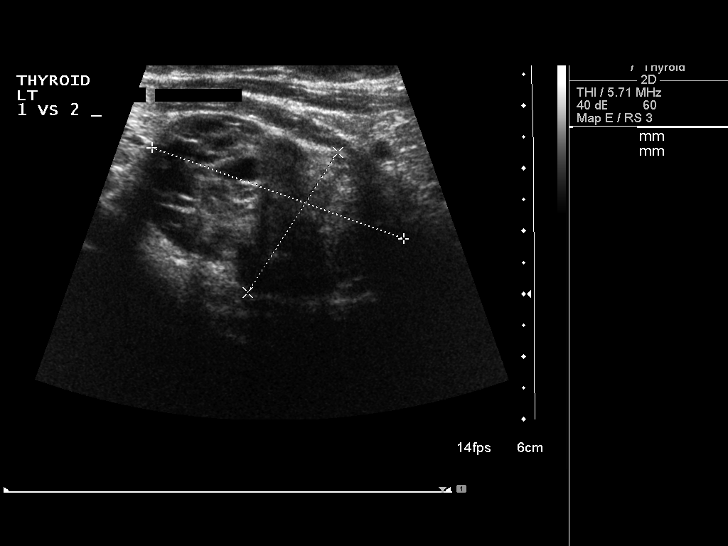
[im 41/50]
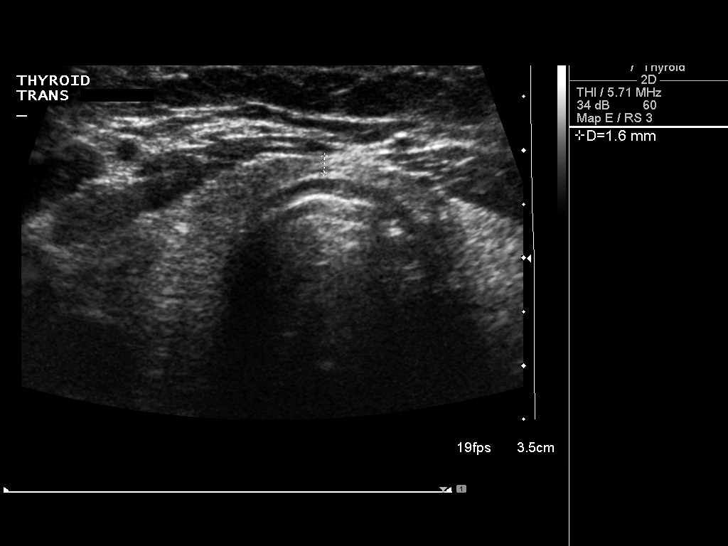
[im 45/50]
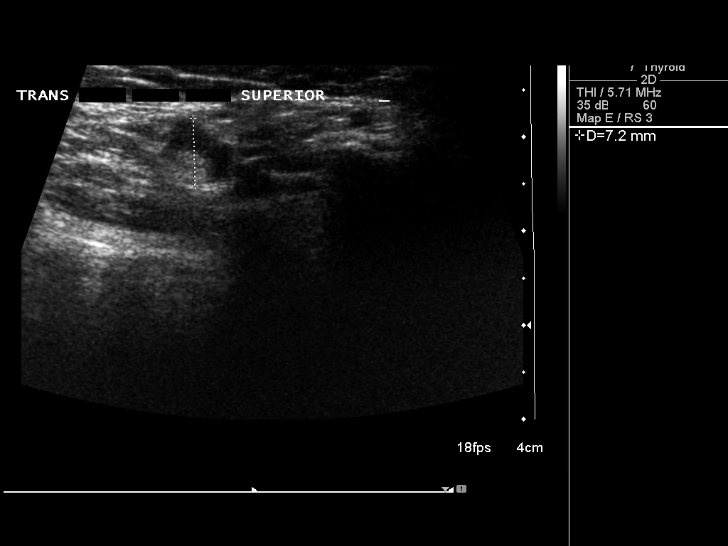
[im 50/50]
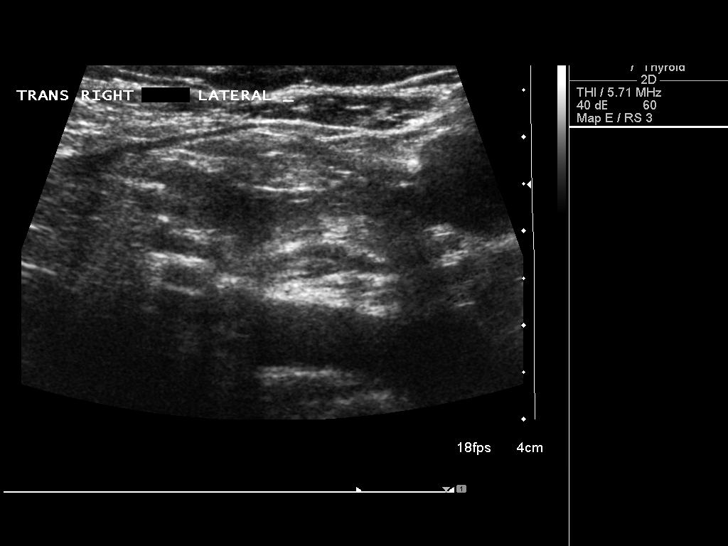

[14 of 25 positions shown; findings below may reference images not displayed]

FINDINGS: Right thyroid lobe

Measurements: 5.9 x 2.0 x 1.5 cm. Again noted is a heterogeneous
nodule involving the inferior right thyroid lobe. This nodule
measures 1.9 x 1.1 x 1.0 cm, previously measured 1.6 x 0.7 x 1.0 cm.
No other nodules or cysts are identified on today's examination.

Left thyroid lobe

Measurements: 8.5 x 2.8 x 1.9 cm. There is a heterogeneous solid
nodule in the mid posterior left thyroid lobe that measures 1.6 x
1.0 x 1.4 cm and previously measured 1.7 x 0.9 x 1.4 cm. Large
dominant heterogeneous nodule along the inferior aspect of the left
thyroid lobe measures 4.3 x 2.7 x 3.8 cm, previously measured 4.7 x
2.1 x 3.4 cm on 06/06/2014.

Isthmus

Thickness: 0.2 cm.  No nodules visualized.

Lymphadenopathy

Small lymph nodes on both sides of the neck. No suspicious
lymphadenopathy.
IMPRESSION: Bilateral thyroid nodules.  Minimal change in the dominant nodules.

## 2016-12-05 IMAGING — CT CT ABDOMEN W/O CM
3 of 4 series · 13 of 36 positions shown, 19 images · IV contrast (READICAT/WATER)
Comparison: CT scan in March 12, 2014.

CLINICAL DATA: Benign neoplasm of right adrenal gland.

EXAM:
CT ABDOMEN WITHOUT CONTRAST
TECHNIQUE: Multidetector CT imaging of the abdomen was performed following the
standard protocol without IV contrast.

[Series 3: abdomen w/o · axial · non-contrast · 0.70mm/px · z∈[-272,-72]mm · 6 of 56 slices shown, 11 images]
[im 8/56  soft-tissue]
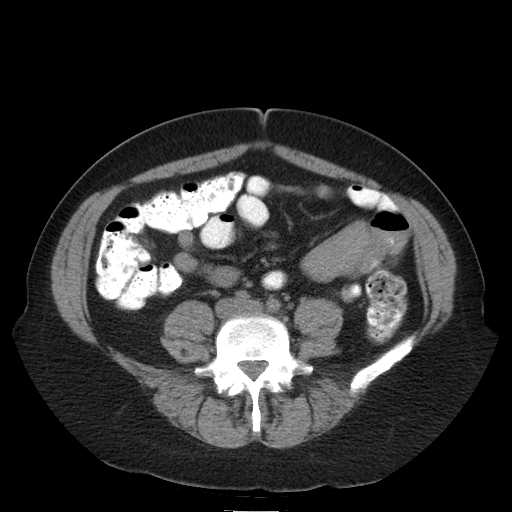
[im 8/56  bone]
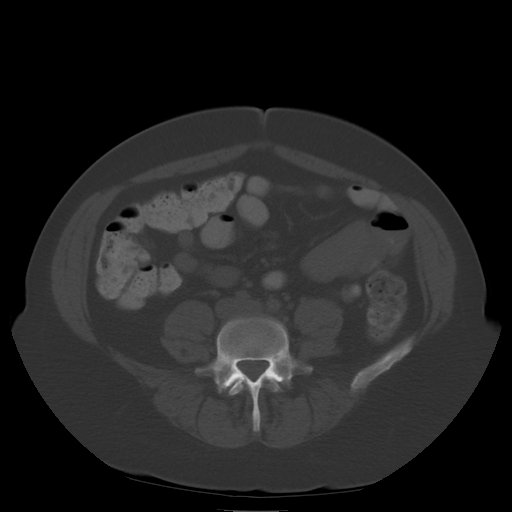
[im 16/56  soft-tissue]
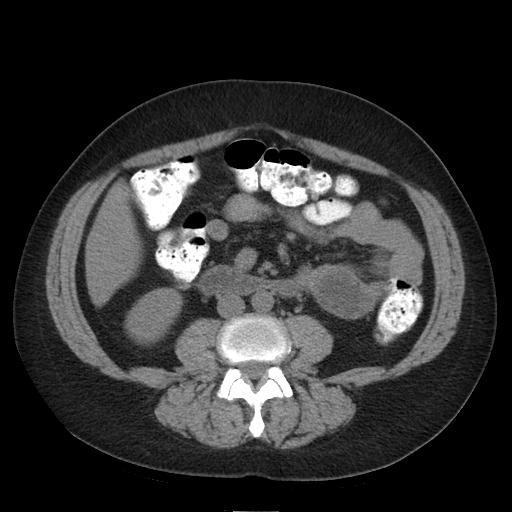
[im 24/56  soft-tissue]
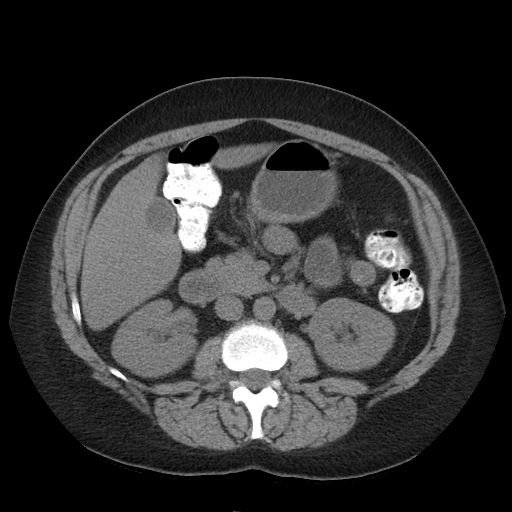
[im 24/56  lung]
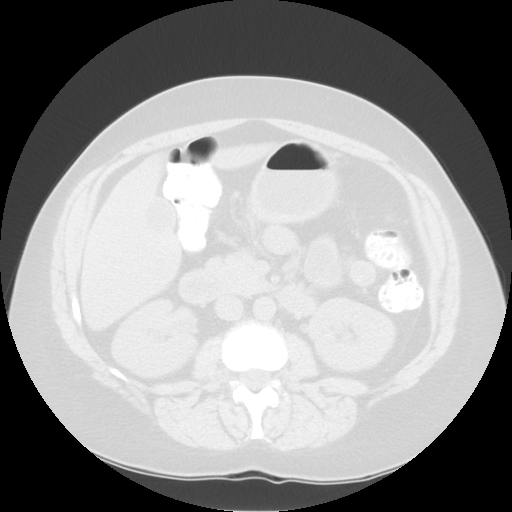
[im 32/56  soft-tissue]
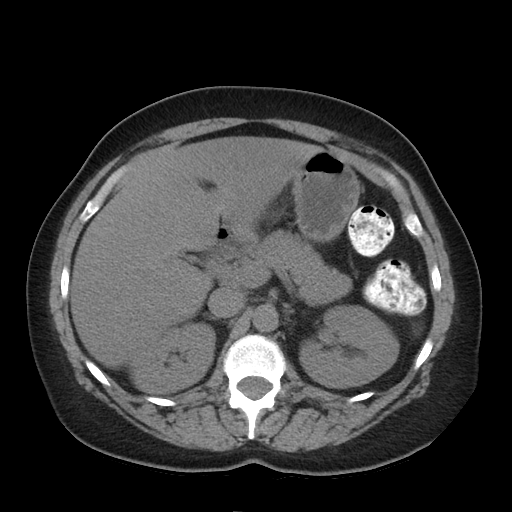
[im 32/56  lung]
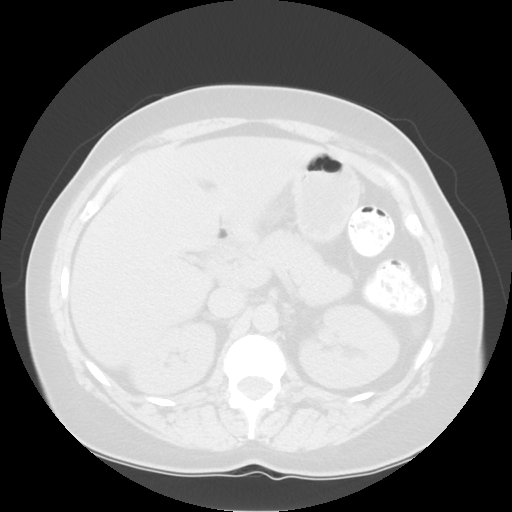
[im 40/56  soft-tissue]
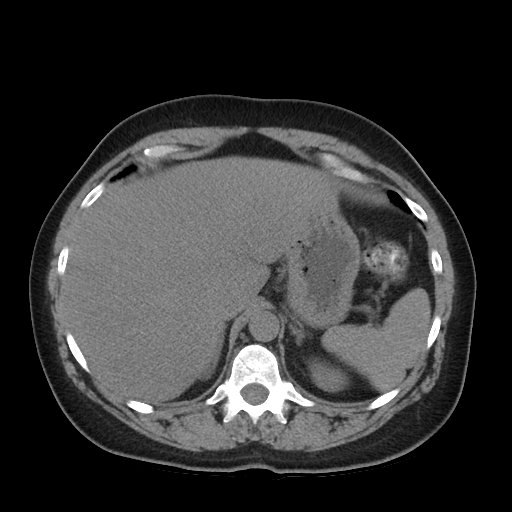
[im 40/56  lung]
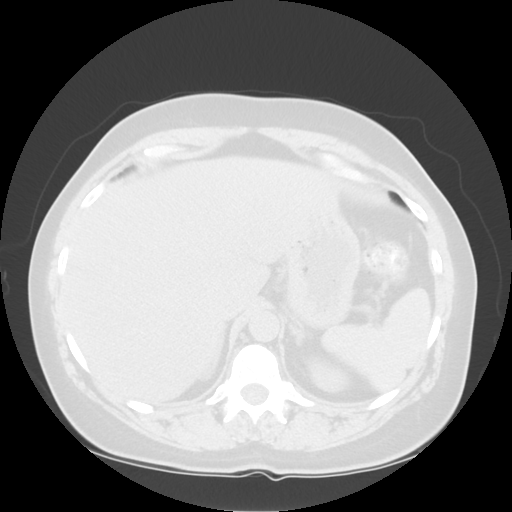
[im 48/56  soft-tissue]
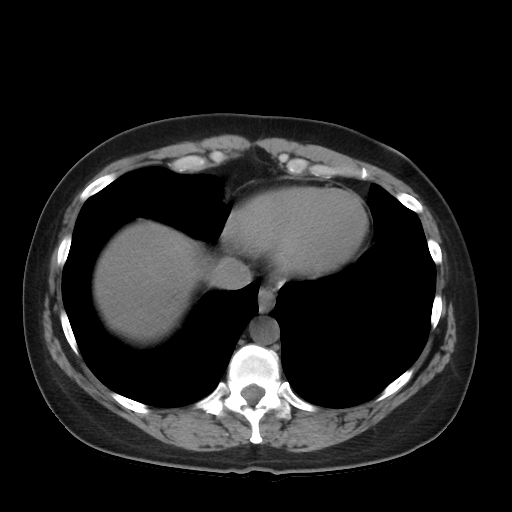
[im 48/56  lung]
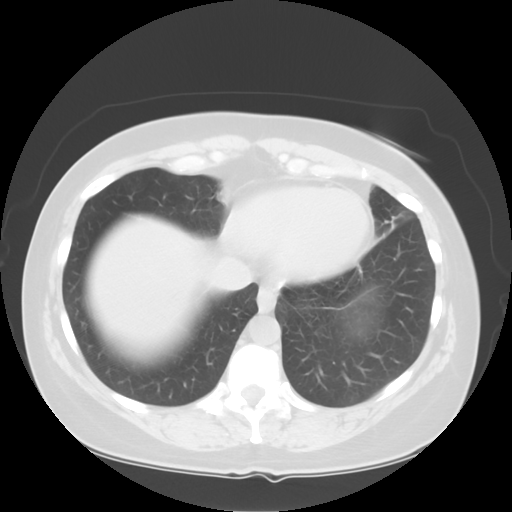

[Series 601: coronal body · coronal · 0.70mm/px · 1 of 121 slices shown, 2 images]
[im 41/121  soft-tissue]
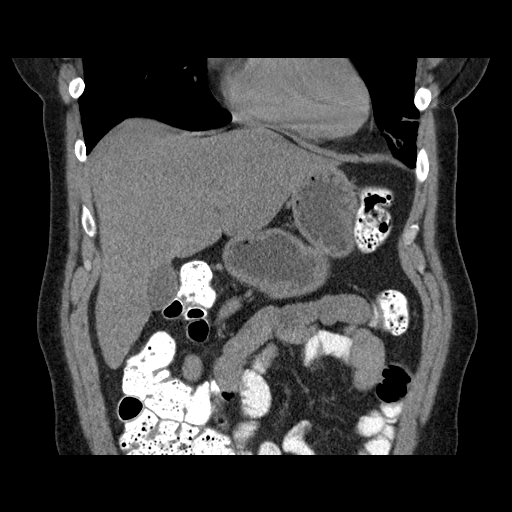
[im 41/121  bone]
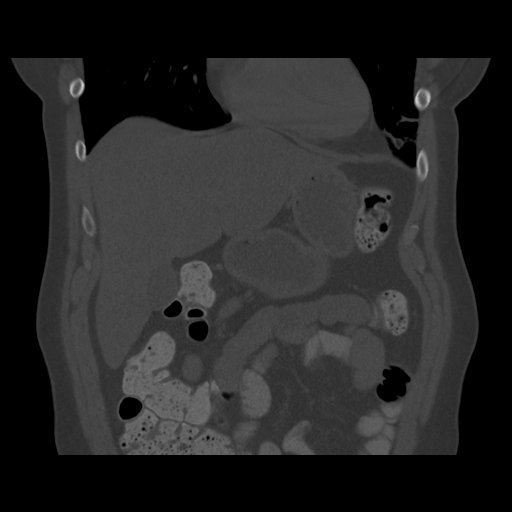

[Series 602: sagittal body · sagittal · 0.70mm/px · 6 of 144 slices shown]
[im 15/144  soft-tissue]
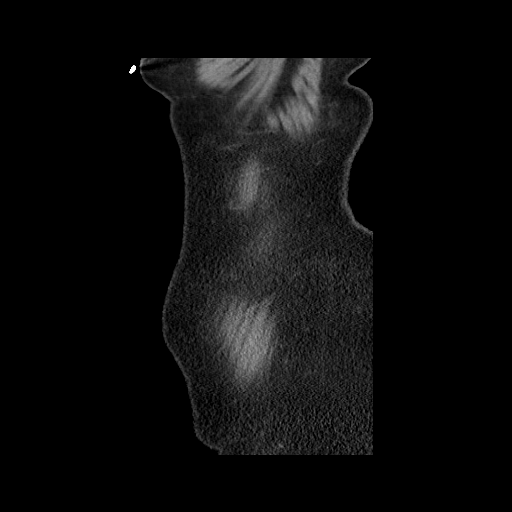
[im 29/144  soft-tissue]
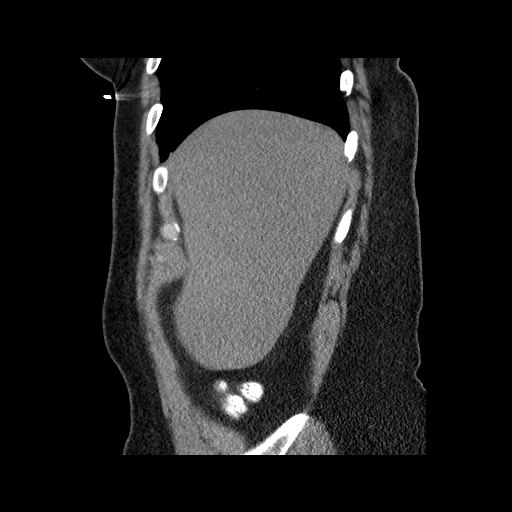
[im 43/144  soft-tissue]
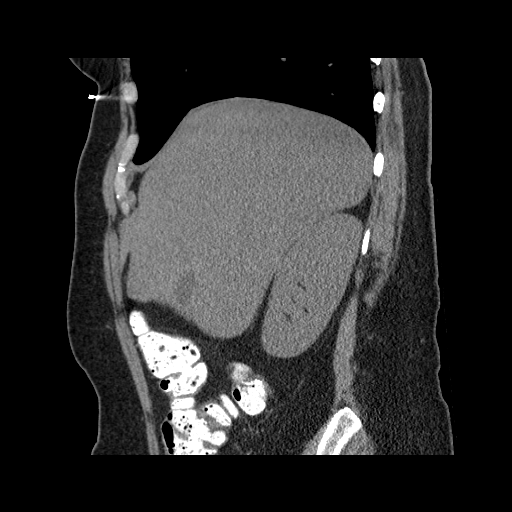
[im 65/144  soft-tissue]
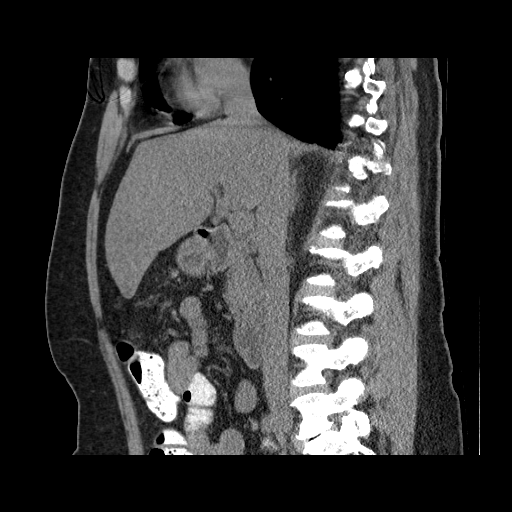
[im 79/144  soft-tissue]
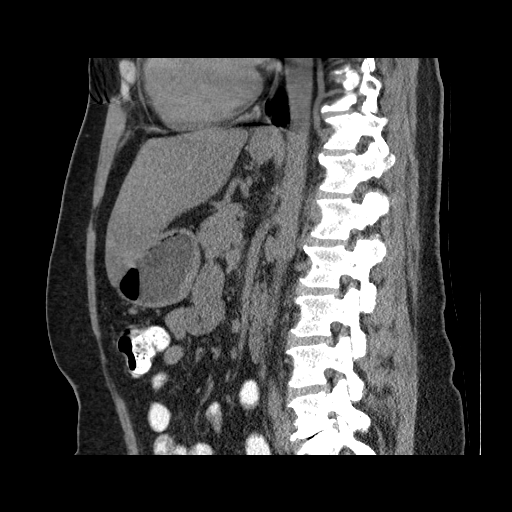
[im 101/144  soft-tissue]
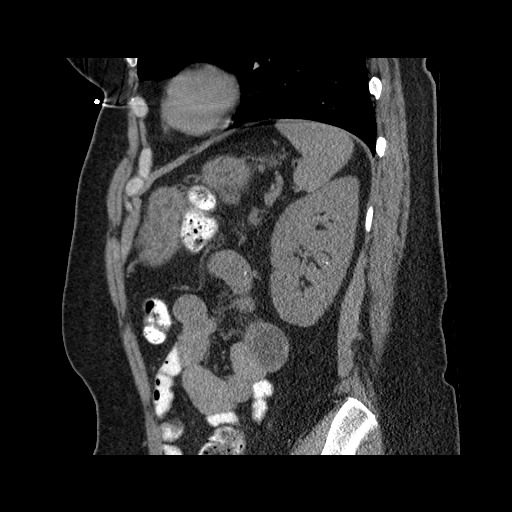

[13 of 36 positions shown; findings below may reference images not displayed]

FINDINGS: Moderate degenerative disc disease is noted at L5-S1. Visualized
lung bases are unremarkable.

No gallstones are noted. Possible mild fatty infiltration of the
liver is noted. The spleen and pancreas appear normal. Stable 1.1 cm
right adrenal adenoma is noted. Left adrenal gland appears normal.
Stable small nonobstructive left renal calculus is noted. No
hydronephrosis or renal obstruction is noted. There is no evidence
of bowel obstruction. No abnormal fluid collection is noted. No
definite adenopathy is noted.
IMPRESSION: Possible mild fatty infiltration of the liver.

Stable 1.1 cm right adrenal adenoma.

Stable small nonobstructive left renal calculus without
hydronephrosis or renal obstruction.

## 2016-12-10 ENCOUNTER — Ambulatory Visit: Payer: BLUE CROSS/BLUE SHIELD | Admitting: Family Medicine

## 2016-12-15 ENCOUNTER — Ambulatory Visit (INDEPENDENT_AMBULATORY_CARE_PROVIDER_SITE_OTHER): Payer: BLUE CROSS/BLUE SHIELD | Admitting: Family Medicine

## 2016-12-15 ENCOUNTER — Encounter: Payer: Self-pay | Admitting: Family Medicine

## 2016-12-15 VITALS — BP 130/74 | HR 73 | Temp 98.1°F | Resp 16 | Ht 66.0 in | Wt 179.5 lb

## 2016-12-15 DIAGNOSIS — K5904 Chronic idiopathic constipation: Secondary | ICD-10-CM

## 2016-12-15 DIAGNOSIS — Z720 Tobacco use: Secondary | ICD-10-CM

## 2016-12-15 DIAGNOSIS — R2242 Localized swelling, mass and lump, left lower limb: Secondary | ICD-10-CM

## 2016-12-15 DIAGNOSIS — M79652 Pain in left thigh: Secondary | ICD-10-CM | POA: Diagnosis not present

## 2016-12-15 DIAGNOSIS — Z9109 Other allergy status, other than to drugs and biological substances: Secondary | ICD-10-CM | POA: Diagnosis not present

## 2016-12-15 MED ORDER — DOCUSATE SODIUM 100 MG PO CAPS: 100.0000 mg | ORAL_CAPSULE | Freq: Two times a day (BID) | ORAL | 0 refills | Status: DC

## 2016-12-15 MED ORDER — MOMETASONE FUROATE 50 MCG/ACT NA SUSP: 2.0000 | Freq: Every day | NASAL | 12 refills | Status: DC

## 2016-12-15 NOTE — Patient Instructions (Signed)
[place] Probiotic/colon health  Or   Align Probiotic  Water intake  Colace 100 mg day  Metamucil 1/2 dose

## 2016-12-15 NOTE — Progress Notes (Signed)
Patient ID: Gwendolyn [name], female    DOB: 08-May-1965, 51 y.o.   MRN: [id number]  Chief Complaint  Patient presents with  . Diabetes  . Follow-up    ultrasound on leg  . Nasal Congestion    Allergies Patient has no known allergies.  Subjective:   Gwendolyn [name] is a 51 y.o. female who presents to [facility] today.  HPI Here for follow up of her recent bronchitis/cough. Has been feeling much better. Is not SOB or coughing. Completed the antibiotic. Did have to use the inhaler, but is not having to use it now. Is not coughing up sputum nor does she feel winded with exertion. Reports that she does have some nasal congestion but is not experiencing sinus pain or pressure. Reports she has a history of allergies to pet dander and does have a dog in her home. She reports that she needs to be on her allergy medicine but quit taking it.  Patient reports that she has history of thyroid nodules, and adrenal adenoma, and diabetes. She is followed by Dr. Ballon/endocrinologist and is wondering if we can follow her here for these conditions.  Patient reports that she is also here to discuss the place in her left thigh that is still bothering her. She reports she was told that the ultrasound did not reveal any abnormalities. She still reports that the place in her thigh bothers her and causes her pain. She reports that the pain is a pressure sensation that is worse after she has been sitting for a while. She has not taken any medication for the pain. She denies any numbness or tingling in her extremities. She reports that she can feel the area but does not believe it is gotten any bigger since she was last seen here.  She reports that she did not take the medication for her mood because she did find out that she got a new job. Reports that since she knows she will be leaving her stressful work environment that she has been able to deal with the stress much better. She reports that she  only has 7 days left at her current job and then will be moving to a new office. She reports that she is feeling much better. She denies anxiety or mood problems. She reports she has cut down on her alcohol intake to less than 1 drink per day. She is still smoking and is not ready to quit at this time. She reports that she will consider it but is not ready. She reports she did come to get her blood work today and also has a follow-up with endocrine next week.  Patient also has a question regarding her constipation. Reports that she has been evaluated for constipation and irritable bowel syndrome for many years. Was prescribed LInzess for her constipation, but she reports that the medication is just too strong. She reports that she used to have alternating diarrhea and constipation years ago but now just struggles more with constipation. Reports she was given this medication by gastroenterology. She has never had a colonoscopy. Denies any blood in her stool or change from her usual bowel habits. She reports that she tries to drink a lot of water. Has never used any stool softeners or fiber supplements. Worse that she has a very small caliber stools and has to strain to have a bowel movement.    Past Medical History:  Diagnosis Date  . Diabetes mellitus without complication ([place])   .  History of TIAs   . Palpitations   . Polycystic ovarian disease   . Tobacco abuse     Past Surgical History:  Procedure Laterality Date  . TUBAL LIGATION      Family History  Problem Relation Age of Onset  . Hypertension Mother   . Diabetes Father   . Diabetes Brother      Social History   Social History  . Marital status: Married    Spouse name: N/A  . Number of children: N/A  . Years of education: N/A   Social History Main Topics  . Smoking status: Current Every Day Smoker    Packs/day: 0.25    Years: 20.00  . Smokeless tobacco: Never Used  . Alcohol use 3.0 oz/week    5 Glasses of wine per week   . Drug use: No  . Sexual activity: Not Currently    Birth control/ protection: Post-menopausal   Other Topics Concern  . None   Social History Narrative   Exercises rarely. Smokes tobacco. Works in a [employer], political [employer]. Lives in [place]. Eats meat fruit and vegetables.     Review of Systems  Constitutional: Negative for activity change, appetite change, diaphoresis, fever and unexpected weight change.  HENT: Positive for congestion. Negative for sinus pressure.   Respiratory: Negative for cough, choking, chest tightness, shortness of breath, wheezing and stridor.   Cardiovascular: Negative for leg swelling.  Gastrointestinal: Positive for constipation. Negative for abdominal pain, blood in stool, diarrhea, nausea and vomiting.  Musculoskeletal: Negative for back pain, gait problem and joint swelling.       Still pain in the left thigh.  Hematological: Negative for adenopathy. Does not bruise/bleed easily.  Psychiatric/Behavioral: Negative for agitation, dysphoric mood, hallucinations and sleep disturbance.     Objective:   BP 130/74 (BP Location: Left Arm, Patient Position: Sitting, Cuff Size: Normal)   Pulse 73   Temp 98.1 F (36.7 C) (Other (Comment))   Resp 16   Ht 5\' 6"  (1.676 m)   Wt 179 lb 8 oz (81.4 kg)   LMP 03/30/2011   SpO2 94%   BMI 28.97 kg/m   Physical Exam  Constitutional: She is oriented to person, place, and time. She appears well-developed and well-nourished. No distress.  HENT:  Head: Normocephalic and atraumatic.  Eyes: Pupils are equal, round, and reactive to light.  Neck: Normal range of motion. Neck supple. No thyromegaly present.  Cardiovascular: Normal rate, regular rhythm and normal heart sounds.   Pulmonary/Chest: Effort normal and breath sounds normal. No respiratory distress. She has no wheezes. She has no rales. She exhibits no tenderness.  Abdominal: Soft. Bowel sounds are normal. She exhibits  no distension. There is no tenderness.  Musculoskeletal: Normal range of motion. She exhibits no edema.  Neurological: She is alert and oriented to person, place, and time. No cranial nerve deficit.  Skin: Skin is warm and dry.  Psychiatric: She has a normal mood and affect. Her behavior is normal. Judgment and thought content normal.  Nursing note and vitals reviewed.    Assessment and Plan   1. Left thigh pain/palpable lesion Discussed ultrasound with patient today. We discussed that just because the ultrasound did not reveal any abnormality that this did not mean there was no lesion or abnormality present. Patient still reports pain and lesion is still palpable. Will order test and call patient with results. Patient was also given the alternative to see a specialist for evaluation prior to proceeding  with the MRI. She requested to get the testing before seeing specialist for evaluation. - MR FEMUR LEFT W WO CONTRAST; Future  2. Environmental allergies Allergies discussed and due to patient's allergy and the fact that she has a dog at is recommended that she be on medication. Counseled on use of medication.Patient counseled in detail regarding the risks of medication. Told to call or return to clinic if develop any worrisome signs or symptoms. Patient voiced understanding.   - mometasone (NASONEX) 50 MCG/ACT nasal spray; Place 2 sprays into the nose daily.  Dispense: 17 g; Refill: 12  3. Chronic idiopathic constipation Patient reports that she has had a history of this long-term and is had a workup for this. We did discuss that she is 51 years old and will need a screening colonoscopy within the next 12 months. At this time it is recommended that patient try a stool softener daily as directed. In addition she was asked to increase her water intake. She will also try Metamucil, but at half the regular dosing. Patient was counseled concerning worrisome signs and symptoms and if those develop to  please call the office. I did ask her about going ahead and doing the screening colonoscopy referral and she defers that at this time. She will discontinue the Linsess at this time. - docusate sodium (COLACE) 100 MG capsule; Take 1 capsule (100 mg total) by mouth 2 (two) times daily.  Dispense: 10 capsule; Refill: 0 Exercise recommended also as a means for decreasing constipation. 4. Mass of left thigh See previous dictation above. - MR FEMUR LEFT W WO CONTRAST; Future  5. Tobacco abuse The 5 A's Model for treating Tobacco Use and Dependence was used today. I have identified and documented tobacco use status for this patient. I have urged the patient to quit tobacco use. At this time, the patient is unwilling and not ready to attempt to quit. I have provided patient with information regarding risks, cessation techniques, and interventions that might increase future attempts to quit smoking. I will plan on again addressing tobacco dependence at the next visit. Patient was counseled that if she does not quit smoking that the episodes of bronchitis and breathing difficulties will only increase. She will consider smoking cessation and we will discuss at next visit.  I did tell patient that I recommend she continue to see her endocrinologist for her multiple endocrine problems. I did ask her to have her labs from Dr. Michiel Sites sent to our office.   Return in about 3 months (around 03/17/2017) for follow up. Caren Macadam, MD 12/15/2016

## 2016-12-16 ENCOUNTER — Encounter: Payer: Self-pay | Admitting: Family Medicine

## 2016-12-16 LAB — BASIC METABOLIC PANEL
BUN: 12 mg/dL (ref 7–25)
CO2: 28 mmol/L (ref 20–32)
Calcium: 9.9 mg/dL (ref 8.6–10.2)
Chloride: 102 mmol/L (ref 98–110)
Creat: 0.51 mg/dL (ref 0.50–1.10)
GLUCOSE: 120 mg/dL (ref 65–139)
Potassium: 4.3 mmol/L (ref 3.5–5.3)
SODIUM: 139 mmol/L (ref 135–146)

## 2016-12-16 LAB — HEMOGLOBIN A1C
Hgb A1c MFr Bld: 7.1 % of total Hgb — ABNORMAL HIGH (ref <5.7)
MEAN PLASMA GLUCOSE: 157 (calc)
eAG (mmol/L): 8.7 (calc)

## 2016-12-22 ENCOUNTER — Telehealth: Payer: Self-pay | Admitting: Family Medicine

## 2016-12-22 NOTE — Telephone Encounter (Signed)
Patient is requesting a call to go over her MRI results

## 2016-12-23 NOTE — Telephone Encounter (Signed)
Please call patient and find out date done. Tell her I do not have results but we will request. Please request results. Gwen Her. Mannie Stabile, MD

## 2016-12-23 NOTE — Telephone Encounter (Signed)
Called patient regarding MRI date/time. She states she has not had MRI- she was calling to inform us they never scheduled it. She is to have as [facility] and per their request, a fax was sent to have them with all requested information a couple weeks ago. I called to schedule, could not get through on phone, so I sent another fax.

## 2016-12-27 NOTE — Telephone Encounter (Signed)
Called patient to see if she had heard anything from imaging center yet. I have not received any information.

## 2016-12-29 ENCOUNTER — Telehealth: Payer: Self-pay | Admitting: *Deleted

## 2016-12-29 NOTE — Telephone Encounter (Signed)
[facility] again and she states she will call patient to schedule.,

## 2016-12-29 NOTE — Telephone Encounter (Signed)
Patient called wanting to know when her MRI will be scheduled patient states she has not heard anything about her MRI. Please advise [phone]

## 2017-01-01 ENCOUNTER — Ambulatory Visit
Admission: RE | Admit: 2017-01-01 | Discharge: 2017-01-01 | Disposition: A | Discharge status: Home / Self Care | Payer: BLUE CROSS/BLUE SHIELD | Source: Ambulatory Visit | Attending: Family Medicine | Admitting: Family Medicine

## 2017-01-01 DIAGNOSIS — M79652 Pain in left thigh: Secondary | ICD-10-CM

## 2017-01-01 DIAGNOSIS — R2242 Localized swelling, mass and lump, left lower limb: Secondary | ICD-10-CM

## 2017-01-01 MED ORDER — GADOBENATE DIMEGLUMINE 529 MG/ML IV SOLN
15.0000 mL | Freq: Once | INTRAVENOUS | Status: AC | PRN
  Administered 2017-01-01: 15 mL via INTRAVENOUS

## 2017-01-03 ENCOUNTER — Telehealth: Payer: Self-pay | Admitting: Family Medicine

## 2017-01-03 DIAGNOSIS — D1720 Benign lipomatous neoplasm of skin and subcutaneous tissue of unspecified limb: Secondary | ICD-10-CM

## 2017-01-03 NOTE — Telephone Encounter (Signed)
I called patient and left message for her to call back to discuss her MRI.  If patient calls back please advise her that it is recommended that she go to a surgeon and have the area removed versus biopsy to ensure that it is nothing harmful.  Please advised that I have placed a referral for general surgery.

## 2017-01-03 NOTE — Telephone Encounter (Signed)
Patient informed of message below, verbalized understanding.  

## 2017-01-04 ENCOUNTER — Telehealth: Payer: Self-pay | Admitting: Family Medicine

## 2017-01-04 DIAGNOSIS — M7989 Other specified soft tissue disorders: Secondary | ICD-10-CM

## 2017-01-04 NOTE — Telephone Encounter (Signed)
Patient called this morning requesting to review her MRI and discuss her referral.  She requests a referral to [facility] to see a Psychologist, sport and exercise there.  She would like for the referral to [facility] health be canceled. I called PAL line with [facility] to discuss the most appropriate referral/physician. Awaiting call back. Gwen Her. Mannie Stabile, MD

## 2017-01-04 NOTE — Telephone Encounter (Signed)
Discussed with Dr. Redmond Pulling at [facility] who is a orthopedic cancer doctor, he recommends that patient be seen in his office for evaluation and probable removal of tumor.  He requests our office staff to call his nurse Danielle at [phone].  He reports that he should be able to get patient in within the next week and she will need to bring her MRI records to the visit.  Please call and schedule visit and inform patient.  Patient did say on the phone earlier that we could leave a message and she would return call. Gwen Her. Mannie Stabile, MD

## 2017-01-12 NOTE — Telephone Encounter (Signed)
Dusty called and scheduled appointment for patient and discussed the appointment with patient on the phone, per verbal conversation with Dusty.  Please ask her to document this in the patient chart.

## 2017-01-13 DIAGNOSIS — D1720 Benign lipomatous neoplasm of skin and subcutaneous tissue of unspecified limb: Secondary | ICD-10-CM | POA: Insufficient documentation

## 2017-01-17 ENCOUNTER — Telehealth: Payer: Self-pay | Admitting: Family Medicine

## 2017-01-17 NOTE — Telephone Encounter (Signed)
Patient called in 01/04/17 requesting to speak to DrHagler about her imaging.  After speaking with Dr.Hagler, Patient received a referral to [facility] with Dr.Wilson, I called to the office [phone] and spoke to a scheduler to make patient appointment for next availability per Dr.Haglers request.  Patient was scheduled, I then called patient on her mobile phone and discussed appointment instructions. I also called [facility] to have her CT sent to [facility] so patient wouldn't have to pick it up.

## 2017-04-07 ENCOUNTER — Other Ambulatory Visit: Payer: Self-pay

## 2017-04-07 ENCOUNTER — Encounter (HOSPITAL_COMMUNITY): Payer: Self-pay

## 2017-04-07 ENCOUNTER — Ambulatory Visit (HOSPITAL_COMMUNITY): Payer: BLUE CROSS/BLUE SHIELD | Attending: Orthopedic Surgery

## 2017-04-07 DIAGNOSIS — M25662 Stiffness of left knee, not elsewhere classified: Secondary | ICD-10-CM | POA: Diagnosis present

## 2017-04-07 DIAGNOSIS — R2689 Other abnormalities of gait and mobility: Secondary | ICD-10-CM | POA: Insufficient documentation

## 2017-04-07 DIAGNOSIS — R29898 Other symptoms and signs involving the musculoskeletal system: Secondary | ICD-10-CM | POA: Insufficient documentation

## 2017-04-07 DIAGNOSIS — R600 Localized edema: Secondary | ICD-10-CM | POA: Insufficient documentation

## 2017-04-07 NOTE — Patient Instructions (Signed)
   KNEE FLEXION STRETCH - SELF ASSISTED: 3 times, 30 second holds do twice a day  While seated in a chair, use your unaffected leg to bend your affected knee until a stretch is felt.

## 2017-04-07 NOTE — Therapy (Signed)
[place], [place], [address] Phone: [phone]   Fax:  [phone]  Physical Therapy Evaluation  Patient Details  Name: Gwendolyn [name] MRN: [id number] Date of Birth: 1965-02-24 Referring Provider: Magda Bernheim, MD   Encounter Date: 04/07/2017  PT End of Session - 04/07/17 1711    Visit Number  1    Number of Visits  19    Date for PT Re-Evaluation  04/28/17    Authorization Type  [employer] [place]    Authorization Time Period  04/07/17 - 05/19/17    PT Start Time  1518    PT Stop Time  1601    PT Time Calculation (min)  43 min    Activity Tolerance  Patient tolerated treatment well    Behavior During Therapy  [facility] for tasks assessed/performed       Past Medical History:  Diagnosis Date  . Diabetes mellitus without complication ([place])   . History of TIAs   . Palpitations   . Polycystic ovarian disease   . Tobacco abuse     Past Surgical History:  Procedure Laterality Date  . TUBAL LIGATION      There were no vitals filed for this visit.   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 She states she has had swelling and numbness on the outside of her left thigh since her surgery and has had trouble walking. She states her scar feels very sensitive still and she uses her right leg to go up and down stairs and relies on it primarily. She states she has a lot of pain when she bends her left knee and that she can bend it enough to go up stairs with it but that she does not feel strong enough to do so. She is working at [employer] and typically has to walk 3-8 miles a day and has been very limited because of her pain and weakness. She would like to be able to walk normally again and not have pain. She also states it is difficult to put her shoes and socks on her left  foot and she would like this to be easier. She states she enjoys gardening and hopes she can get better to enjoy that this spring and summer.    Limitations  Sitting;Lifting;Standing;Walking;House hold activities    How long can you sit comfortably?  10-15 minutes    How long can you stand comfortably?  10-15 minutes    How long can you walk comfortably?  10-15 minutes    Diagnostic tests  ultrasound and MRI    Patient Stated Goals  want to have 100% recovery and get back to normal    Currently in Pain?  Yes    Pain Score  5     Pain Location  Knee    Pain Orientation  Left    Pain Descriptors / Indicators  Tightness;Discomfort;Sore;Aching;Constant    Pain Type  Chronic pain;Surgical pain    Pain Onset  More than a month ago    Pain Frequency  Constant    Aggravating Factors   bending her knee and walking    Pain Relieving Factors  rest and ice    Effect of Pain on Daily Activities  limited with walking and was work as she needs to  go up multiple flights of stairs throughout her day         [facility] PT Assessment - 04/07/17 0001      Assessment   Medical Diagnosis  Decreased strength and flexibility of LE    Referring Provider  Magda Bernheim, MD    Onset Date/Surgical Date  02/18/17    Next MD Visit  unsure    Prior Therapy  none      Precautions   Precautions  None      Restrictions   Weight Bearing Restrictions  No      Balance Screen   Has the patient fallen in the past 6 months  No    Has the patient had a decrease in activity level because of a fear of falling?   Yes    Is the patient reluctant to leave their home because of a fear of falling?   Yes      [place] residence    Living Arrangements  Spouse/significant other    Available Help at Discharge  Family    Type of [place] to enter    Entrance Stairs-Number of Steps  2    [place]  One level    [place] - 2 wheels;Cane - single point;Wheelchair - manual      Prior Function   Level of Independence  Independent    Vocation  Full time employment [employer] Requirements  walk a lot at work 3-8 miles any day    Leisure  gardening      Cognition   Overall Cognitive Status  Within Functional Limits for tasks assessed      Observation/Other Assessments   Skin Integrity  assess scar next session, limited by clothing    Focus on Therapeutic Outcomes (FOTO)   70% limited      Observation/Other Assessments-Edema    Edema  -- assess next session, limited by clothing      Sensation   Additional Comments  Patient reports decreased sensation and parasthesia to left lateral thigh      Functional Tests   Functional tests  Squat;Single leg stance      Single Leg Stance   Comments  Rt LE = 15 seconds, Lt LE = unable      ROM / Strength   AROM / PROM / Strength  AROM;Strength      AROM   AROM Assessment Site  Knee    Right/Left Knee  Right;Left    Right Knee Extension  0    Right Knee Flexion  140    Left Knee Extension  0    Left Knee Flexion  80 painful      Strength   Overall Strength Comments  patient unable to tolerate prone position for hip flexor testin due to sensitivity on left anterior thigh    Strength Assessment Site  Hip;Knee;Ankle    Right Hip Flexion  4-/5 pain in back    Right Hip ABduction  5/5    Left Hip Flexion  4-/5    Left Hip ABduction  4/5    Right/Left Knee  Right;Left    Right Knee Flexion  5/5    Right Knee Extension  5/5    Left Knee Flexion  5/5    Left Knee Extension  5/5  Right Ankle Dorsiflexion  4+/5    Right Ankle Plantar Flexion  4-/5    Left Ankle Dorsiflexion  4+/5    Left Ankle Plantar Flexion  4-/5      Palpation   Patella mobility  decreased mobility with medial glide and inferior glide to left patella    Palpation comment  patient with extreme sensitivity to light touch and light palpation around mid to lateral  distal left thigh and has notal swelling around latearl and medial knee both supiriorly and inferiorly limited assessment due to patient's clothing      Ambulation/Gait   Ambulation/Gait  Yes    Ambulation/Gait Assistance  6: Modified independent (Device/Increase time)    Ambulation Distance (Feet)  370 Feet    Gait Pattern  Step-through pattern;Decreased step length - right;Decreased stride length;Decreased stance time - left;Decreased hip/knee flexion - left;Decreased weight shift to left;Trendelenburg;Antalgic;Wide base of support    Gait velocity  0.62 m/s gait velocity indicative of fall risk    Stairs  Yes    Stairs Assistance  6: Modified independent (Device/Increase time)    Stair Management Technique  One rail Right;Two rails;Step to pattern;Forwards    Number of Stairs  4    Height of Stairs  6    Gait Comments  patient ascends leading with Rt LE and descends leading with Lt LE, she is unsteady with 1 hand rail to ascend/descend         Objective measurements completed on examination: See above findings.    [facility] Adult PT Treatment/Exercise - 04/07/17 0001      Exercises   Exercises  Knee/Hip      Knee/Hip Exercises: Stretches   Knee: Self-Stretch to increase Flexion  Left;2 reps;30 seconds;Limitations    Knee: Self-Stretch Limitations  seated assisted stretch       PT Education - 04/07/17 1710    Education provided  Yes    Education Details  Educated on exam findings and appropriate POC. Initiated HEP and instructed on exercise.    Person(s) Educated  Patient    Methods  Explanation;Handout    Comprehension  Verbalized understanding      PT Short Term Goals - 04/07/17 1734      PT SHORT TERM GOAL #1   Title  Patient will be independent with HEP and demonstrate proper  form/technique to improve functional strength and flexibility.    Time  3    Period  Weeks    Status  New    Target Date  04/28/17      PT SHORT TERM GOAL #2   Title  Patient will improve  left knee flexion by 10 degrees to  demonstrate functional change in ROM for improved gait and stair mobility.      Time  3    Period  Weeks    Status  New      PT SHORT TERM GOAL #3   Title  Patient with improve MMT by 1/2 grade for limited muscle groups  and be able to tolerate prone position for hip extensor testing  demonstrating decreased sensitivity of left incision.     Time  3    Period  Weeks    Status  New      PT Long Term Goals - 04/07/17 1738      PT LONG TERM GOAL #1   Title  Patient with improve MMT by 1 grade for limited muscle groups to  improve functional gait and stair mobility for  improved activity tolerance  at work.    Time  6    Period  Weeks    Status  New    Target Date  05/19/17      PT LONG TERM GOAL #2   Title  Patient will improve FOTO score by 12% to demonstrate a  significant improvement in function and decreased limitations at work and  [employer] daily activities.    Time  6    Period  Weeks    Status  New      PT LONG TERM GOAL #3   Title  Patient will improve left knee flexion by 15 degrees to  demonstrate functional change in ROM for improved gait and stair mobility  and to be able to return to gardening this spring/summer.    Time  6    Period  Weeks    Status  New       Plan - 04/07/17 1713    Clinical Impression Statement  Ms. Woolman presents for initial PT evaluation follow left knee/thigh surgery to remove a lipoma.  She presents with significant restriction of left knee ROM into flexion, decreased muscle length of her left quadriceps, left LE weakness, decreased gait and mobility, pain, hypersensitivity along her incision, and localized edema. She will benefit from skilled PT services to address the above impairments, progress towards goals, decrease pain, and improve overall QOL and mobility to return to PLOF.    History and Personal Factors relevant to plan of care:  02/18/17 - removal of lipoma from left thigh traveling through  her quad into the bone (benign)    Clinical Presentation  Stable    Clinical Presentation due to:  MMT, FOTO, 3MWT, ROM, clinical judement (significant restriction of left knee ROM into flexion, decreased muscle length of her left quadriceps, left LE weakness, decreased gait and mobility, pain, hypersensitivity along her incision, and localized edema)    Rehab Potential  Good    PT Frequency  3x / week    PT Duration  6 weeks    PT Treatment/Interventions  ADLs/Self Care Home Management;Gait training;Stair training;Functional mobility training;Moist Heat;Electrical Stimulation;Cryotherapy;Therapeutic activities;Therapeutic exercise;Balance training;Neuromuscular re-education;Patient/family education;Manual techniques;Scar mobilization;Passive range of motion;Energy conservation;Taping    PT Next Visit Plan  Review Initiatl Eval and goal. Initiate retrograde massage to toelartion for left knee. Initiate ROM for kene flexion limitations and left hip strenghtening. Assess scar mobility and begin desensitization activities, provide HEP for desensitization.     PT Home Exercise Plan  Eval: self knee flexion stretch    Consulted and Agree with Plan of Care  Patient       Patient will benefit from skilled therapeutic intervention in order to improve the following deficits and impairments:  Abnormal gait, Decreased skin integrity, Increased fascial restricitons, Pain, Impaired sensation, Improper body mechanics, Decreased mobility, Decreased scar mobility, Increased muscle spasms, Postural dysfunction, Hypomobility, Decreased strength, Decreased range of motion, Decreased activity tolerance, Decreased balance, Difficulty walking, Increased edema, Impaired flexibility  Visit Diagnosis: Stiffness of left knee, not elsewhere classified  Other abnormalities of gait and mobility  Other symptoms and signs involving the musculoskeletal system  Localized edema     Problem List Patient Active Problem List    Diagnosis Date Noted  . Tobacco abuse   . TOBACCO ABUSE 04/22/2010  . DIZZINESS 04/21/2010  . FATIGUE 04/21/2010  . NIGHT SWEATS 04/21/2010  . HEADACHE 04/21/2010  . PALPITATIONS 04/21/2010  . SHORTNESS OF BREATH 04/21/2010  . DIABETES MELLITUS, BORDERLINE 04/21/2010  Kipp Brood, PT, DPT Physical Therapist with [facility]  04/07/2017 5:42 PM    [facility] [address] [place], [place], [address] Phone: [phone]   Fax:  [phone]  Name: Gwendolyn [name] MRN: [id number] Date of Birth: 11-Feb-1966

## 2017-04-12 ENCOUNTER — Ambulatory Visit (HOSPITAL_COMMUNITY): Payer: BLUE CROSS/BLUE SHIELD

## 2017-04-12 ENCOUNTER — Encounter (HOSPITAL_COMMUNITY): Payer: Self-pay

## 2017-04-12 ENCOUNTER — Other Ambulatory Visit: Payer: Self-pay

## 2017-04-12 DIAGNOSIS — R600 Localized edema: Secondary | ICD-10-CM

## 2017-04-12 DIAGNOSIS — M25662 Stiffness of left knee, not elsewhere classified: Secondary | ICD-10-CM

## 2017-04-12 DIAGNOSIS — R29898 Other symptoms and signs involving the musculoskeletal system: Secondary | ICD-10-CM

## 2017-04-12 DIAGNOSIS — R2689 Other abnormalities of gait and mobility: Secondary | ICD-10-CM

## 2017-04-12 NOTE — Therapy (Signed)
[place], [place], [address] Phone: [phone]   Fax:  [phone]  Physical Therapy Treatment  Patient Details  Name: Gwendolyn [name] MRN: [id number] Date of Birth: [birth year] Referring Provider: Magda Bernheim, MD   Encounter Date: 04/12/2017  PT End of Session - 04/12/17 1616    Visit Number  2    Number of Visits  19    Date for PT Re-Evaluation  04/28/17    Authorization Type  [employer] [place]    Authorization Time Period  04/07/17 - 05/19/17    PT Start Time  1521    PT Stop Time  1603    PT Time Calculation (min)  42 min    Activity Tolerance  Patient tolerated treatment well;No increased pain    Behavior During Therapy  W[facility] for tasks assessed/performed       Past Medical History:  Diagnosis Date  . Diabetes mellitus without complication ([place])   . History of TIAs   . Palpitations   . Polycystic ovarian disease   . Tobacco abuse     Past Surgical History:  Procedure Laterality Date  . TUBAL LIGATION      There were no vitals filed for this visit.  Subjective Assessment - 04/12/17 1608    Subjective  Patietn reports she is doing well today and is just feeling tired. She just got off of work and had a long day and is ready to go home and rest. She states she has been doing her HEP and stretching every day and that the glue has finally come off of her scar. She reports that today at work she was able to go step-over-step while going up teh stairs and is using her left leg more but was unable to do so when going down.    Limitations  Sitting;Lifting;Standing;Walking;House hold activities    How long can you sit comfortably?  10-15 minutes    How long can you stand comfortably?  10-15 minutes    How long can you walk comfortably?  10-15 minutes    Diagnostic tests  ultrasound and MRI    Patient Stated Goals  want to have 100% recovery and get back to normal    Currently in Pain?  Yes    Pain  Score  4     Pain Location  Knee    Pain Orientation  Left    Pain Descriptors / Indicators  Discomfort;Tightness;Constant    Pain Type  Chronic pain;Surgical pain    Pain Onset  More than a month ago    Pain Frequency  Constant    Aggravating Factors   walking, bending her knee    Pain Relieving Factors  rest    Effect of Pain on Daily Activities  limits her walking at work         [facility] Adult PT Treatment/Exercise - 04/12/17 0001      Manual Therapy   Manual Therapy  Edema management;Soft tissue mobilization;Myofascial release;Other (comment);Joint mobilization    Manual therapy comments  Performed seperate of other skilled interventions    Edema Management  retrograde massage to left knee joint    Joint Mobilization  3x 30-45 seconds in all planes for patellofemoral mobilization in open pack position for left knee, 3x 30-45 seconds grade III oscillations for AP glide to tibiofemoral joint for left knee flexion    Soft tissue mobilization  parallell and cross friction massage to quadricep tendon and  distal muscl belyl of quadriceps on left LE    Other Manual Therapy  Scar desensitization with cotton netting; patient insturcted on how to perform this at home and provided piece of netting for this         PT Education - 04/12/17 1614    Education provided  Yes    Education Details  Educated on scar desensitization with soft material progressing to more course materials to decrease sensitivity of anterior knee. Educated o continue with HEP stretch and will update next session. Educated on purpose of manual therapy to improve muscle elasticity and decrease scar tissue adhesions    Person(s) Educated  Patient    Methods  Explanation    Comprehension  Verbalized understanding       PT Short Term Goals - 04/07/17 1734      PT SHORT TERM GOAL #1   Title  Patient will be independent with HEP and demonstrate proper form/technique to improve functional strength and flexibility.     Time  3    Period  Weeks    Status  New    Target Date  04/28/17      PT SHORT TERM GOAL #2   Title  Patient will improve left knee flexion by 10 degrees to demonstrate functional change in ROM for improved gait and stair mobility.     Time  3    Period  Weeks    Status  New      PT SHORT TERM GOAL #3   Title  Patient with improve MMT by 1/2 grade for limited muscle groups and be able to tolerate prone position for hip extensor testing demonstrating decreased sensitivity of left incision.     Time  3    Period  Weeks    Status  New        PT Long Term Goals - 04/07/17 1738      PT LONG TERM GOAL #1   Title  Patient with improve MMT by 1 grade for limited muscle groups to improve functional gait and stair mobility for improved activity tolerance at work.    Time  6    Period  Weeks    Status  New    Target Date  05/19/17      PT LONG TERM GOAL #2   Title  Patient will improve FOTO score by 12% to demonstrate a significant improvement in function and decreased limitations at work and [employer] daily activities.    Time  6    Period  Weeks    Status  New      PT LONG TERM GOAL #3   Title  Patient will improve left knee flexion by 15 degrees to demonstrate functional change in ROM for improved gait and stair mobility and to be able to return to gardening this spring/summer.    Time  6    Period  Weeks    Status  New         Plan - 04/12/17 1616    Clinical Impression Statement  Patient initiated first treatment session today. Session focused on manual interventions to address soft tissue restrictions and decrease scar tissue adhesions around quadriceps tendon. Patient reported mild aching pain but stated her thigh started to feel better throughout treatment. Patella mobility was assessed and is limited therefore, joint mobilization was initiated this session to improve joint mobility.  Patient was educated on progression of desensitization for incision with varying materials  and educated on importance of  continuing with HEP daily. She will continue to benefit from skilled PT services to address the above impairments, progress towards goals, decrease pain, and improve overall QOL and mobility to return to PLOF.    Rehab Potential  Good    PT Frequency  3x / week    PT Duration  6 weeks    PT Treatment/Interventions  ADLs/Self Care Home Management;Gait training;Stair training;Functional mobility training;Moist Heat;Electrical Stimulation;Cryotherapy;Therapeutic activities;Therapeutic exercise;Balance training;Neuromuscular re-education;Patient/family education;Manual techniques;Scar mobilization;Passive range of motion;Energy conservation;Taping    PT Next Visit Plan  Continue retrograde massage to toelartion for left knee. Initiate ROM for kene flexion limitations and left hip strenghtening. Continue desensitization activities for scar and initiate scar massage when ready. Continue soft tissue work to left quadriceps and patella mobilization. Update HEP.    PT Home Exercise Plan  Eval: self knee flexion stretch; 04/12/17 - scar desensitization;     Consulted and Agree with Plan of Care  Patient       Patient will benefit from skilled therapeutic intervention in order to improve the following deficits and impairments:  Abnormal gait, Decreased skin integrity, Increased fascial restricitons, Pain, Impaired sensation, Improper body mechanics, Decreased mobility, Decreased scar mobility, Increased muscle spasms, Postural dysfunction, Hypomobility, Decreased strength, Decreased range of motion, Decreased activity tolerance, Decreased balance, Difficulty walking, Increased edema, Impaired flexibility  Visit Diagnosis: Stiffness of left knee, not elsewhere classified  Other abnormalities of gait and mobility  Other symptoms and signs involving the musculoskeletal system  Localized edema     Problem List Patient Active Problem List   Diagnosis Date Noted  . Tobacco  abuse   . TOBACCO ABUSE 04/22/2010  . DIZZINESS 04/21/2010  . FATIGUE 04/21/2010  . NIGHT SWEATS 04/21/2010  . HEADACHE 04/21/2010  . PALPITATIONS 04/21/2010  . SHORTNESS OF BREATH 04/21/2010  . DIABETES MELLITUS, BORDERLINE 04/21/2010    Kipp Brood, PT, DPT Physical Therapist with [facility]  04/12/2017 4:23 PM    [facility] [address] [place], [place], [address] Phone: [phone]   Fax:  [phone]  Name: Gwendolyn [name] MRN: [id number] Date of Birth: [birth year]

## 2017-04-13 ENCOUNTER — Ambulatory Visit (HOSPITAL_COMMUNITY): Payer: BLUE CROSS/BLUE SHIELD

## 2017-04-13 ENCOUNTER — Other Ambulatory Visit: Payer: Self-pay

## 2017-04-13 ENCOUNTER — Encounter (HOSPITAL_COMMUNITY): Payer: Self-pay

## 2017-04-13 DIAGNOSIS — R600 Localized edema: Secondary | ICD-10-CM

## 2017-04-13 DIAGNOSIS — M25662 Stiffness of left knee, not elsewhere classified: Secondary | ICD-10-CM | POA: Diagnosis not present

## 2017-04-13 DIAGNOSIS — R29898 Other symptoms and signs involving the musculoskeletal system: Secondary | ICD-10-CM

## 2017-04-13 DIAGNOSIS — R2689 Other abnormalities of gait and mobility: Secondary | ICD-10-CM

## 2017-04-13 NOTE — Patient Instructions (Signed)
   SHORT ARC QUAD  - SAQ: 1-2 sets of 15-20 repetitions  Place a rolled up towel or object under your knee and slowly straighten your knee as your raise up  your foot.      Contract/Relax Quad Stretch: 3-5 repetitions:  Lie down flat on your stomach. Wrap a strap (belt, towel, dog leash) around the top of one of your feet and pull the strap across your opposite shoulder so that your knee starts to curl up to your body.   Pull against the rope like you are trying to kick your foot towards the table, at the same time pull on the rope to keep you leg from moving. Hold this for 5-8 seconds.  Then relax your muscle and pull on the rope bending your knee more until a stretch is felt across the front of your thigh.  Hold for 30 seconds.  Repeat the contraction and try to pull you knee further each time.

## 2017-04-13 NOTE — Therapy (Signed)
[place], [place], [address] Phone: [phone]   Fax:  [phone]  Physical Therapy Treatment  Patient Details  Name: Gwendolyn [name] MRN: [id number] Date of Birth: April 22, 1965 Referring Provider: Magda Bernheim, MD   Encounter Date: 04/13/2017  PT End of Session - 04/13/17 1634    Visit Number  3    Number of Visits  19    Date for PT Re-Evaluation  04/28/17    Authorization Type  [employer] [place]    Authorization Time Period  04/07/17 - 05/19/17    PT Start Time  1519    PT Stop Time  1603    PT Time Calculation (min)  44 min    Activity Tolerance  Patient tolerated treatment well;No increased pain    Behavior During Therapy  WFL for tasks assessed/performed       Past Medical History:  Diagnosis Date  . Diabetes mellitus without complication ([place])   . History of TIAs   . Palpitations   . Polycystic ovarian disease   . Tobacco abuse     Past Surgical History:  Procedure Laterality Date  . TUBAL LIGATION      There were no vitals filed for this visit.  Subjective Assessment - 04/13/17 1527    Subjective  Patient had a really tough day at work and reports she is really sore form it. Currently she is at a 7/10. She reports that following her last session she had less pain.     Limitations  Sitting;Lifting;Standing;Walking;House hold activities    How long can you sit comfortably?  10-15 minutes    How long can you stand comfortably?  10-15 minutes    How long can you walk comfortably?  10-15 minutes    Diagnostic tests  ultrasound and MRI    Patient Stated Goals  want to have 100% recovery and get back to normal    Currently in Pain?  Yes    Pain Score  7     Pain Location  Knee    Pain Orientation  Left;Lateral    Pain Descriptors / Indicators  Aching;Discomfort;Tightness;Constant    Pain Type  Surgical pain;Chronic pain    Pain Onset  More than a month ago    Pain Frequency  Constant     Aggravating Factors   walking, bending her knee    Pain Relieving Factors  rest, ice    Effect of Pain on Daily Activities  limits her at work        [facility] Adult PT Treatment/Exercise - 04/13/17 0001      Knee/Hip Exercises: Supine   Short Arc [employer]  Strengthening;AROM;Left;15 reps;Limitations;2 sets    Short Arc Quad Sets Limitations  5' holds      Knee/Hip Exercises: Prone   Contract/Relax to Increase Flexion  3x left LE with rope; contract 5-8 seconds, relax 30 seconds      Modalities   Modalities  Moist Heat      Moist Heat Therapy   Number Minutes Moist Heat  6 Minutes at start of session to improve muscle elasticity; not billed    Moist Heat Location  Knee left thigh distal at quad tendon      Manual Therapy   Manual Therapy  Edema management;Soft tissue mobilization;Myofascial release;Other (comment)    Manual therapy comments  Performed seperate of other skilled interventions    Edema Management  retrograde massage to left knee joint  Soft tissue mobilization  parallell and cross friction massage to quadricep tendon and distal muscle belly of quadriceps on left LE    Myofascial Release  Scar massage to reduce adhesions       PT Education - 04/13/17 1529    Education provided  Yes    Education Details  educated on purpose of interventions throughout. Educated on stretches and exercise for HEP.     Person(s) Educated  Patient    Methods  Explanation;Handout    Comprehension  Verbalized understanding       PT Short Term Goals - 04/07/17 1734      PT SHORT TERM GOAL #1   Title  Patient will be independent with HEP and demonstrate proper form/technique to improve functional strength and flexibility.    Time  3    Period  Weeks    Status  New    Target Date  04/28/17      PT SHORT TERM GOAL #2   Title  Patient will improve left knee flexion by 10 degrees to demonstrate functional change in ROM for improved gait and stair mobility.     Time  3    Period   Weeks    Status  New      PT SHORT TERM GOAL #3   Title  Patient with improve MMT by 1/2 grade for limited muscle groups and be able to tolerate prone position for hip extensor testing demonstrating decreased sensitivity of left incision.     Time  3    Period  Weeks    Status  New        PT Long Term Goals - 04/07/17 1738      PT LONG TERM GOAL #1   Title  Patient with improve MMT by 1 grade for limited muscle groups to improve functional gait and stair mobility for improved activity tolerance at work.    Time  6    Period  Weeks    Status  New    Target Date  05/19/17      PT LONG TERM GOAL #2   Title  Patient will improve FOTO score by 12% to demonstrate a significant improvement in function and decreased limitations at work and [employer] daily activities.    Time  6    Period  Weeks    Status  New      PT LONG TERM GOAL #3   Title  Patient will improve left knee flexion by 15 degrees to demonstrate functional change in ROM for improved gait and stair mobility and to be able to return to gardening this spring/summer.    Time  6    Period  Weeks    Status  New         Plan - 04/13/17 1623    Clinical Impression Statement  Patient arrived with more pain today and stated she had done a lot more activity and physical labor at work today. She reported that her knee felt better after yesterday's treatment and that she iced afterwards. Treatment focus remained on manual interventions to address soft tissue restrictions and decrease scar tissue adhesions around quadriceps tendon. Scar massage initiated and patient reported pain decreased throughout intervention. New quad strengthening to prevent atrophy and contract relax stretch were provided for HEP. She will continue to benefit from skilled PT services to address the above impairments, progress towards goals, decrease pain, and improve overall QOL and mobility to return to PLOF.    Rehab  Potential  Good    PT Frequency  3x / week     PT Duration  6 weeks    PT Treatment/Interventions  ADLs/Self Care Home Management;Gait training;Stair training;Functional mobility training;Moist Heat;Electrical Stimulation;Cryotherapy;Therapeutic activities;Therapeutic exercise;Balance training;Neuromuscular re-education;Patient/family education;Manual techniques;Scar mobilization;Passive range of motion;Energy conservation;Taping    PT Next Visit Plan  Continue retrograde massage to toleration for left knee. Initiate ROM for kene flexion limitations and left hip strenghtening. Continue scar massage and soft tissue work to left quadriceps and patella mobilization. Contineu with contract relax stretch, introduce lunge stretch, HS curl, and continue quad strengthening.     PT Home Exercise Plan  Eval: self knee flexion stretch; 04/12/17 - scar desensitization; 04/13/17 - contract relax quad stretch in prone, SAQ;     Consulted and Agree with Plan of Care  Patient       Patient will benefit from skilled therapeutic intervention in order to improve the following deficits and impairments:  Abnormal gait, Decreased skin integrity, Increased fascial restricitons, Pain, Impaired sensation, Improper body mechanics, Decreased mobility, Decreased scar mobility, Increased muscle spasms, Postural dysfunction, Hypomobility, Decreased strength, Decreased range of motion, Decreased activity tolerance, Decreased balance, Difficulty walking, Increased edema, Impaired flexibility  Visit Diagnosis: Stiffness of left knee, not elsewhere classified  Other abnormalities of gait and mobility  Other symptoms and signs involving the musculoskeletal system  Localized edema     Problem List Patient Active Problem List   Diagnosis Date Noted  . Tobacco abuse   . TOBACCO ABUSE 04/22/2010  . DIZZINESS 04/21/2010  . FATIGUE 04/21/2010  . NIGHT SWEATS 04/21/2010  . HEADACHE 04/21/2010  . PALPITATIONS 04/21/2010  . SHORTNESS OF BREATH 04/21/2010  . DIABETES  MELLITUS, BORDERLINE 04/21/2010    Kipp Brood, PT, DPT Physical Therapist with [facility]  04/13/2017 4:36 PM     [facility] [address] [place], [place], [address] Phone: [phone]   Fax:  [phone]  Name: Gwendolyn [name] MRN: [id number] Date of Birth: 03-01-1965

## 2017-04-14 ENCOUNTER — Ambulatory Visit (HOSPITAL_COMMUNITY): Payer: BLUE CROSS/BLUE SHIELD | Admitting: Physical Therapy

## 2017-04-14 DIAGNOSIS — R600 Localized edema: Secondary | ICD-10-CM

## 2017-04-14 DIAGNOSIS — M25662 Stiffness of left knee, not elsewhere classified: Secondary | ICD-10-CM | POA: Diagnosis not present

## 2017-04-14 DIAGNOSIS — R2689 Other abnormalities of gait and mobility: Secondary | ICD-10-CM

## 2017-04-14 DIAGNOSIS — R29898 Other symptoms and signs involving the musculoskeletal system: Secondary | ICD-10-CM

## 2017-04-14 NOTE — Therapy (Signed)
[place], [place], [address] Phone: [phone]   Fax:  [phone]  Physical Therapy Treatment  Patient Details  Name: Gwendolyn [name] MRN: [id number] Date of Birth: 1966-01-14 Referring Provider: Magda Bernheim, MD   Encounter Date: 04/14/2017  PT End of Session - 04/14/17 1750    Visit Number  4    Number of Visits  19    Date for PT Re-Evaluation  04/28/17    Authorization Type  [employer]    Authorization Time Period  04/07/17 - 05/19/17    PT Start Time  1520    PT Stop Time  1600    PT Time Calculation (min)  40 min    Activity Tolerance  Patient tolerated treatment well;No increased pain    Behavior During Therapy  WFL for tasks assessed/performed       Past Medical History:  Diagnosis Date  . Diabetes mellitus without complication ([place])   . History of TIAs   . Palpitations   . Polycystic ovarian disease   . Tobacco abuse     Past Surgical History:  Procedure Laterality Date  . TUBAL LIGATION      There were no vitals filed for this visit.  Subjective Assessment - 04/14/17 1533    Subjective  Pt states yesterday was really bad with swellign and pain.  Reports relief of feeling better today with current pain of 3/10.  States she feels the heat and massage was really helpful.    Currently in Pain?  Yes    Pain Score  3     Pain Location  Knee    Pain Orientation  Left    Pain Descriptors / Indicators  Aching    Pain Type  Surgical pain                      [facility] Adult PT Treatment/Exercise - 04/14/17 0001      Knee/Hip Exercises: Stretches   Knee: Self-Stretch to increase Flexion  Left;10 seconds;Limitations    Knee: Self-Stretch Limitations  10 reps with foot in chair      Knee/Hip Exercises: Supine   Short Arc [employer]  Strengthening;AROM;Left;15 reps;Limitations;2 sets    Short Arc Quad Sets Limitations  5" holds    Heel Slides  Left;5 reps    Straight  Leg Raises  Left;5 reps;Limitations    Straight Leg Raises Limitations  working on form without extension lag    Knee Flexion  AROM;Limitations    Knee Flexion Limitations  95 was 80 degrees      Knee/Hip Exercises: Prone   Contract/Relax to Increase Flexion  --      Modalities   Modalities  Moist Heat      Moist Heat Therapy   Number Minutes Moist Heat  6 Minutes    Moist Heat Location  Knee      Manual Therapy   Manual Therapy  Edema management;Soft tissue mobilization;Myofascial release;Other (comment)    Manual therapy comments  Performed seperate of other skilled interventions    Edema Management  retrograde massage to left knee joint    Soft tissue mobilization  parallell and cross friction massage to quadricep tendon and distal muscle belly of quadriceps on left LE    Myofascial Release  Scar massage to reduce adhesions    Other Manual Therapy  Scar desensitization with cotton netting; patient insturcted on how to perform this at home and  provided piece of netting for this             PT Education - 04/13/17 1529    Education provided  Yes    Education Details  educated on purpose of interventions throughout. Educated on stretches and exercise for HEP.     Person(s) Educated  Patient    Methods  Explanation;Handout    Comprehension  Verbalized understanding       PT Short Term Goals - 04/07/17 1734      PT SHORT TERM GOAL #1   Title  Patient will be independent with HEP and demonstrate proper form/technique to improve functional strength and flexibility.    Time  3    Period  Weeks    Status  New    Target Date  04/28/17      PT SHORT TERM GOAL #2   Title  Patient will improve left knee flexion by 10 degrees to demonstrate functional change in ROM for improved gait and stair mobility.     Time  3    Period  Weeks    Status  New      PT SHORT TERM GOAL #3   Title  Patient with improve MMT by 1/2 grade for limited muscle groups and be able to tolerate  prone position for hip extensor testing demonstrating decreased sensitivity of left incision.     Time  3    Period  Weeks    Status  New        PT Long Term Goals - 04/07/17 1738      PT LONG TERM GOAL #1   Title  Patient with improve MMT by 1 grade for limited muscle groups to improve functional gait and stair mobility for improved activity tolerance at work.    Time  6    Period  Weeks    Status  New    Target Date  05/19/17      PT LONG TERM GOAL #2   Title  Patient will improve FOTO score by 12% to demonstrate a significant improvement in function and decreased limitations at work and [employer] daily activities.    Time  6    Period  Weeks    Status  New      PT LONG TERM GOAL #3   Title  Patient will improve left knee flexion by 15 degrees to demonstrate functional change in ROM for improved gait and stair mobility and to be able to return to gardening this spring/summer.    Time  6    Period  Weeks    Status  New            Plan - 04/14/17 1751    Clinical Impression Statement  Less pain reported today stating the moist heat and massage have really helped.  continued with heat first, massage then exercise as completed last session.  Completed some gentle Myofascial to adhesed area when knee flexed and with noted tightness.  Able to achieve 95 degrees flexion in supine today (improvement from 80 degrees at eval).  Instructed with standing knee drives (using chair) and instructed to contiue therex, use of heat and self massage at home to improve symptoms.  Overall progressing towards goals.     Rehab Potential  Good    PT Frequency  3x / week    PT Duration  6 weeks    PT Treatment/Interventions  ADLs/Self Care Home Management;Gait training;Stair training;Functional mobility training;Moist Heat;Electrical Stimulation;Cryotherapy;Therapeutic activities;Therapeutic exercise;Balance training;Neuromuscular  re-education;Patient/family education;Manual techniques;Scar  mobilization;Passive range of motion;Energy conservation;Taping    PT Next Visit Plan  Continue retrograde massage to toleration for left knee. Progress ROM for knee flexion limitations and left hip strenghtening. 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  Next session begin standing HS curl and conitinue with knee drives.       PT Home Exercise Plan  Eval: self knee flexion stretch; 04/12/17 - scar desensitization; 04/13/17 - contract relax quad stretch in prone, SAQ;     Consulted and Agree with Plan of Care  Patient       Patient will benefit from skilled therapeutic intervention in order to improve the following deficits and impairments:  Abnormal gait, Decreased skin integrity, Increased fascial restricitons, Pain, Impaired sensation, Improper body mechanics, Decreased mobility, Decreased scar mobility, Increased muscle spasms, Postural dysfunction, Hypomobility, Decreased strength, Decreased range of motion, Decreased activity tolerance, Decreased balance, Difficulty walking, Increased edema, Impaired flexibility  Visit Diagnosis: Stiffness of left knee, not elsewhere classified  Other abnormalities of gait and mobility  Other symptoms and signs involving the musculoskeletal system  Localized edema     Problem List Patient Active Problem List   Diagnosis Date Noted  . Tobacco abuse   . TOBACCO ABUSE 04/22/2010  . DIZZINESS 04/21/2010  . FATIGUE 04/21/2010  . NIGHT SWEATS 04/21/2010  . HEADACHE 04/21/2010  . PALPITATIONS 04/21/2010  . SHORTNESS OF BREATH 04/21/2010  . DIABETES MELLITUS, BORDERLINE 04/21/2010   Teena Irani, PTA/CLT [phone]  Teena Irani 04/14/2017, 5:58 PM  [place], [place], [address] Phone: [phone]   Fax:  [phone]  Name: Gwendolyn [name] MRN: [id number] Date of Birth: 12-28-1965

## 2017-04-15 ENCOUNTER — Telehealth (HOSPITAL_COMMUNITY): Payer: Self-pay | Admitting: Family Medicine

## 2017-04-15 NOTE — Telephone Encounter (Signed)
04/15/17 left patient a message asking if she could come in at 2:30 instead of 3:15 on March 15

## 2017-04-18 ENCOUNTER — Telehealth (HOSPITAL_COMMUNITY): Payer: Self-pay | Admitting: Family Medicine

## 2017-04-18 ENCOUNTER — Ambulatory Visit (HOSPITAL_COMMUNITY): Payer: BLUE CROSS/BLUE SHIELD | Admitting: Physical Therapy

## 2017-04-18 NOTE — Telephone Encounter (Signed)
04/18/17  pt called and said that she was running 30 minutes behind and could we still see her.  I told her per our policy no more than 15 minutes late and she said she would just cancel for today and be here Wednesday

## 2017-04-20 ENCOUNTER — Ambulatory Visit (HOSPITAL_COMMUNITY): Payer: BLUE CROSS/BLUE SHIELD | Attending: Orthopedic Surgery | Admitting: Physical Therapy

## 2017-04-20 DIAGNOSIS — R2689 Other abnormalities of gait and mobility: Secondary | ICD-10-CM | POA: Diagnosis present

## 2017-04-20 DIAGNOSIS — M25662 Stiffness of left knee, not elsewhere classified: Secondary | ICD-10-CM | POA: Diagnosis present

## 2017-04-20 DIAGNOSIS — R600 Localized edema: Secondary | ICD-10-CM | POA: Diagnosis present

## 2017-04-20 DIAGNOSIS — R29898 Other symptoms and signs involving the musculoskeletal system: Secondary | ICD-10-CM | POA: Diagnosis present

## 2017-04-20 NOTE — Therapy (Signed)
[facility] [place], [place], [address] Phone: [phone]   Fax:  [phone]  Physical Therapy Treatment  Patient Details  Name: Gwendolyn [name] MRN: [id number] Date of Birth: Jul 04, 1965 Referring Provider: Magda Bernheim, MD   Encounter Date: 04/20/2017  PT End of Session - 04/20/17 1608    Visit Number  4    Number of Visits  19    Date for PT Re-Evaluation  04/28/17    Authorization Type  [employer] [place]    Authorization Time Period  04/07/17 - 05/19/17    PT Start Time  1522    PT Stop Time  1606    PT Time Calculation (min)  44 min    Activity Tolerance  Patient tolerated treatment well;No increased pain    Behavior During Therapy  WFL for tasks assessed/performed       Past Medical History:  Diagnosis Date  . Diabetes mellitus without complication ([place])   . History of TIAs   . Palpitations   . Polycystic ovarian disease   . Tobacco abuse     Past Surgical History:  Procedure Laterality Date  . TUBAL LIGATION      There were no vitals filed for this visit.  Subjective Assessment - 04/20/17 1527    Subjective  patient states she can tell an improvement and tried going up/down steps and unable to complete reciprocally going down.  Currently 4/10 pain and swelling continues    Currently in Pain?  Yes    Pain Score  4     Pain Location  Knee    Pain Orientation  Left    Pain Descriptors / Indicators  Aching    Pain Type  Surgical pain                      [facility] Adult PT Treatment/Exercise - 04/20/17 0001      Knee/Hip Exercises: Stretches   Knee: Self-Stretch to increase Flexion  Left;10 seconds;Limitations    Knee: Self-Stretch Limitations  10 reps 12" step      Knee/Hip Exercises: Standing   Heel Raises  10 reps    Knee Flexion  Left;10 reps    Lateral Step Up  Left;10 reps;Step Height: 2";Hand Hold: 1    Forward Step Up  Left;10 reps;Step Height: 2";Hand Hold: 1    Step Down   Left;10 reps;Step Height: 2";Hand Hold: 1      Knee/Hip Exercises: Supine   Short Arc [employer]  Strengthening;AROM;Left;15 reps;Limitations;2 sets    Short Arc Quad Sets Limitations  5" holds    Heel Slides  Left;5 reps    Straight Leg Raises  Left;5 reps;Limitations;2 sets    Straight Leg Raises Limitations  working on form without extension lag    Knee Flexion  AROM;Limitations    Knee Flexion Limitations  104      Modalities   Modalities  Moist Heat      Moist Heat Therapy   Number Minutes Moist Heat  6 Minutes    Moist Heat Location  Knee      Manual Therapy   Manual Therapy  Edema management;Soft tissue mobilization;Myofascial release;Other (comment)    Manual therapy comments  Performed seperate of other skilled interventions    Edema Management  retrograde massage to left knee joint    Soft tissue mobilization  parallell and cross friction massage to quadricep tendon and distal muscle belly of quadriceps on left  LE    Myofascial Release  Scar massage to reduce adhesions    Other Manual Therapy  Scar desensitization with cotton netting; patient insturcted on how to perform this at home and provided piece of netting for this               PT Short Term Goals - 04/07/17 1734      PT SHORT TERM GOAL #1   Title  Patient will be independent with HEP and demonstrate proper form/technique to improve functional strength and flexibility.    Time  3    Period  Weeks    Status  New    Target Date  04/28/17      PT SHORT TERM GOAL #2   Title  Patient will improve left knee flexion by 10 degrees to demonstrate functional change in ROM for improved gait and stair mobility.     Time  3    Period  Weeks    Status  New      PT SHORT TERM GOAL #3   Title  Patient with improve MMT by 1/2 grade for limited muscle groups and be able to tolerate prone position for hip extensor testing demonstrating decreased sensitivity of left incision.     Time  3    Period  Weeks    Status   New        PT Long Term Goals - 04/07/17 1738      PT LONG TERM GOAL #1   Title  Patient with improve MMT by 1 grade for limited muscle groups to improve functional gait and stair mobility for improved activity tolerance at work.    Time  6    Period  Weeks    Status  New    Target Date  05/19/17      PT LONG TERM GOAL #2   Title  Patient will improve FOTO score by 12% to demonstrate a significant improvement in function and decreased limitations at work and [employer] daily activities.    Time  6    Period  Weeks    Status  New      PT LONG TERM GOAL #3   Title  Patient will improve left knee flexion by 15 degrees to demonstrate functional change in ROM for improved gait and stair mobility and to be able to return to gardening this spring/summer.    Time  6    Period  Weeks    Status  New            Plan - 04/20/17 1609    Clinical Impression Statement  Pt making great gains towards goals with improved ROM and less tenderness.  Able to achieve 104 degrees flexion this session (9 degree improvement in 6 days).  Completed therex, including flexion stretch and addition of 2" step activity all without pain and good control noted.  Completed sessio nwith moist heat followed by manual to tight tissue/scar.  Improved SLR today without extension lag and increased ROM.  Instructed to continue currentl HEP.     Rehab Potential  Good    PT Frequency  3x / week    PT Duration  6 weeks    PT Treatment/Interventions  ADLs/Self Care Home Management;Gait training;Stair training;Functional mobility training;Moist Heat;Electrical Stimulation;Cryotherapy;Therapeutic activities;Therapeutic exercise;Balance training;Neuromuscular re-education;Patient/family education;Manual techniques;Scar mobilization;Passive range of motion;Energy conservation;Taping    PT Next Visit Plan  Continue retrograde massage to toleration for left knee. Progress ROM for knee flexion limitations and left hip  strenghtening.  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    PT Home Exercise Plan  Eval: self knee flexion stretch; 04/12/17 - scar desensitization; 04/13/17 - contract relax quad stretch in prone, SAQ;     Consulted and Agree with Plan of Care  Patient       Patient will benefit from skilled therapeutic intervention in order to improve the following deficits and impairments:  Abnormal gait, Decreased skin integrity, Increased fascial restricitons, Pain, Impaired sensation, Improper body mechanics, Decreased mobility, Decreased scar mobility, Increased muscle spasms, Postural dysfunction, Hypomobility, Decreased strength, Decreased range of motion, Decreased activity tolerance, Decreased balance, Difficulty walking, Increased edema, Impaired flexibility  Visit Diagnosis: Stiffness of left knee, not elsewhere classified  Other abnormalities of gait and mobility  Other symptoms and signs involving the musculoskeletal system  Localized edema     Problem List Patient Active Problem List   Diagnosis Date Noted  . Tobacco abuse   . TOBACCO ABUSE 04/22/2010  . DIZZINESS 04/21/2010  . FATIGUE 04/21/2010  . NIGHT SWEATS 04/21/2010  . HEADACHE 04/21/2010  . PALPITATIONS 04/21/2010  . SHORTNESS OF BREATH 04/21/2010  . DIABETES MELLITUS, BORDERLINE 04/21/2010   Teena Irani, PTA/CLT [phone]  Teena Irani 04/20/2017, 4:12 PM  [facility] [address] [place], [place], [address] Phone: [phone]   Fax:  [phone]  Name: VELVET MOOMAW MRN: [id number] Date of Birth: 08-29-1965

## 2017-04-22 ENCOUNTER — Ambulatory Visit (HOSPITAL_COMMUNITY): Payer: BLUE CROSS/BLUE SHIELD

## 2017-04-22 ENCOUNTER — Encounter (HOSPITAL_COMMUNITY): Payer: Self-pay

## 2017-04-22 DIAGNOSIS — R2689 Other abnormalities of gait and mobility: Secondary | ICD-10-CM

## 2017-04-22 DIAGNOSIS — R29898 Other symptoms and signs involving the musculoskeletal system: Secondary | ICD-10-CM

## 2017-04-22 DIAGNOSIS — M25662 Stiffness of left knee, not elsewhere classified: Secondary | ICD-10-CM | POA: Diagnosis not present

## 2017-04-22 DIAGNOSIS — R600 Localized edema: Secondary | ICD-10-CM

## 2017-04-22 NOTE — Therapy (Signed)
[place], [place], [address] Phone: [phone]   Fax:  [phone]  Physical Therapy Treatment  Patient Details  Name: Gwendolyn [name] MRN: [id number] Date of Birth: April 20, 1965 Referring Provider: Magda Bernheim, MD   Encounter Date: 04/22/2017  PT End of Session - 04/22/17 1526    Visit Number  6    Number of Visits  17    Date for PT Re-Evaluation  04/28/17    Authorization Type  [employer] [place]    Authorization Time Period  04/07/17 - 05/19/17    PT Start Time  1522    PT Stop Time  1604    PT Time Calculation (min)  42 min    Activity Tolerance  Patient tolerated treatment well;Patient limited by pain;No increased pain    Behavior During Therapy  WFL for tasks assessed/performed       Past Medical History:  Diagnosis Date  . Diabetes mellitus without complication ([place])   . History of TIAs   . Palpitations   . Polycystic ovarian disease   . Tobacco abuse     Past Surgical History:  Procedure Laterality Date  . TUBAL LIGATION      There were no vitals filed for this visit.  Subjective Assessment - 04/22/17 1523    Subjective  Pt arrived with reports of increased pain and swelling, pain scale 6/10 achey pain.      Patient Stated Goals  want to have 100% recovery and get back to normal    Currently in Pain?  Yes    Pain Score  6     Pain Location  Knee    Pain Orientation  Left    Pain Descriptors / Indicators  Aching    Pain Type  Surgical pain    Pain Onset  More than a month ago    Pain Frequency  Constant    Aggravating Factors   walking. bending her knee    Pain Relieving Factors  rest, ice    Effect of Pain on Daily Activities  limits her at work                      [facility] Adult PT Treatment/Exercise - 04/22/17 0001      Knee/Hip Exercises: Standing   Heel Raises  10 reps    Lateral Step Up  Left;15 reps;Hand Hold: 1;Step Height: 4"    Forward Step Up  Left;Hand  Hold: 1;Step Height: 6";15 reps    Step Down  Left;10 reps;Hand Hold: 1;Step Height: 4"      Knee/Hip Exercises: Supine   Short Arc [employer]  Strengthening;AROM;Left;15 reps;Limitations;2 sets    Short Arc Quad Sets Limitations  5" holds    Heel Slides  Left;10 reps    Patellar Mobs  patella mobs all directions; Instructed inferior patella glides/ HEP    Knee Flexion  AROM;Limitations    Knee Flexion Limitations  108 (was 104 last session)      Knee/Hip Exercises: Prone   Contract/Relax to Increase Flexion  3x left LE with rope; contract 10"; 2 pillows under hips for LBP      Manual Therapy   Manual Therapy  Edema management;Soft tissue mobilization;Myofascial release;Other (comment)    Manual therapy comments  Performed seperate of other skilled interventions    Edema Management  retrograde massage to left knee joint    Soft tissue mobilization  parallell and cross friction massage  to quadricep tendon and distal muscle belly of quadriceps on left LE    Myofascial Release  Scar massage to reduce adhesions               PT Short Term Goals - 04/07/17 1734      PT SHORT TERM GOAL #1   Title  Patient will be independent with HEP and demonstrate proper form/technique to improve functional strength and flexibility.    Time  3    Period  Weeks    Status  New    Target Date  04/28/17      PT SHORT TERM GOAL #2   Title  Patient will improve left knee flexion by 10 degrees to demonstrate functional change in ROM for improved gait and stair mobility.     Time  3    Period  Weeks    Status  New      PT SHORT TERM GOAL #3   Title  Patient with improve MMT by 1/2 grade for limited muscle groups and be able to tolerate prone position for hip extensor testing demonstrating decreased sensitivity of left incision.     Time  3    Period  Weeks    Status  New        PT Long Term Goals - 04/07/17 1738      PT LONG TERM GOAL #1   Title  Patient with improve MMT by 1 grade for  limited muscle groups to improve functional gait and stair mobility for improved activity tolerance at work.    Time  6    Period  Weeks    Status  New    Target Date  05/19/17      PT LONG TERM GOAL #2   Title  Patient will improve FOTO score by 12% to demonstrate a significant improvement in function and decreased limitations at work and [employer] daily activities.    Time  6    Period  Weeks    Status  New      PT LONG TERM GOAL #3   Title  Patient will improve left knee flexion by 15 degrees to demonstrate functional change in ROM for improved gait and stair mobility and to be able to return to gardening this spring/summer.    Time  6    Period  Weeks    Status  New            Plan - 04/22/17 1610    Clinical Impression Statement  Pt arrived with increased pain and edema present proximal knee, feels due to end of week and on her feet alot with work.  Begans session with manual techniuqes to address edema, reduce scar tissue adhesion and soft tissue mobilization to address tightness in quadriceps with reports of pain reduction following.  Able to increased step height and reduce HHA for functional strengthening.  Added pillows under hips with improve tolerance for LBP with contract relax techniques.  Improved AROM 0-108 degrees (was 104 degrees last session).  Reports pain reduced though no pain scale given at EOS.  Pt given inferior patella glides as additional HEP for pain control.      Rehab Potential  Good    PT Frequency  3x / week    PT Duration  6 weeks    PT Treatment/Interventions  ADLs/Self Care Home Management;Gait training;Stair training;Functional mobility training;Moist Heat;Electrical Stimulation;Cryotherapy;Therapeutic activities;Therapeutic exercise;Balance training;Neuromuscular re-education;Patient/family education;Manual techniques;Scar mobilization;Passive range of motion;Energy conservation;Taping    PT Next  Visit Plan  Continue retrograde massage to toleration  for left knee. Progress ROM for knee flexion limitations and left hip strenghtening. 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    PT Home Exercise Plan  Eval: self knee flexion stretch; 04/12/17 - scar desensitization; 04/13/17 - contract relax quad stretch in prone, SAQ; 3/8: inferior patella glides       Patient will benefit from skilled therapeutic intervention in order to improve the following deficits and impairments:  Abnormal gait, Decreased skin integrity, Increased fascial restricitons, Pain, Impaired sensation, Improper body mechanics, Decreased mobility, Decreased scar mobility, Increased muscle spasms, Postural dysfunction, Hypomobility, Decreased strength, Decreased range of motion, Decreased activity tolerance, Decreased balance, Difficulty walking, Increased edema, Impaired flexibility  Visit Diagnosis: Stiffness of left knee, not elsewhere classified  Other abnormalities of gait and mobility  Other symptoms and signs involving the musculoskeletal system  Localized edema     Problem List Patient Active Problem List   Diagnosis Date Noted  . Tobacco abuse   . TOBACCO ABUSE 04/22/2010  . DIZZINESS 04/21/2010  . FATIGUE 04/21/2010  . NIGHT SWEATS 04/21/2010  . HEADACHE 04/21/2010  . PALPITATIONS 04/21/2010  . SHORTNESS OF BREATH 04/21/2010  . DIABETES MELLITUS, BORDERLINE 04/21/2010   Ihor Austin, LPTA; CBIS [phone]  Aldona Lento 04/22/2017, 4:19 PM  [place], [place], [address] Phone: [phone]   Fax:  [phone]  Name: Gwendolyn [name] MRN: [id number] Date of Birth: 12-Jun-1965

## 2017-04-22 NOTE — Patient Instructions (Addendum)
Self-Mobilization: Downward Kneecap Push    With thumbs on upper border of left kneecap, gently push kneecap toward foot. Hold 30 seconds. Repeat 3 times per set. Do 1-2 sets per day.  http://orth.exer.us/582   Copyright  [employer]. All rights reserved.

## 2017-04-25 ENCOUNTER — Ambulatory Visit (HOSPITAL_COMMUNITY): Payer: BLUE CROSS/BLUE SHIELD

## 2017-04-26 ENCOUNTER — Encounter (HOSPITAL_COMMUNITY): Payer: Self-pay

## 2017-04-26 ENCOUNTER — Ambulatory Visit (HOSPITAL_COMMUNITY): Payer: BLUE CROSS/BLUE SHIELD

## 2017-04-26 DIAGNOSIS — R2689 Other abnormalities of gait and mobility: Secondary | ICD-10-CM

## 2017-04-26 DIAGNOSIS — R29898 Other symptoms and signs involving the musculoskeletal system: Secondary | ICD-10-CM

## 2017-04-26 DIAGNOSIS — M25662 Stiffness of left knee, not elsewhere classified: Secondary | ICD-10-CM | POA: Diagnosis not present

## 2017-04-26 DIAGNOSIS — R600 Localized edema: Secondary | ICD-10-CM

## 2017-04-26 NOTE — Therapy (Signed)
[place], [place], [address] Phone: [phone]   Fax:  [phone]  Physical Therapy Treatment  Patient Details  Name: Gwendolyn [name] MRN: [id number] Date of Birth: [birth year] Referring Provider: Magda Bernheim MD   Encounter Date: 04/26/2017  PT End of Session - 04/26/17 1631    Visit Number  7    Number of Visits  17    Date for PT Re-Evaluation  05/19/17    Authorization Type  [employer] [place]    Authorization Time Period  04/07/17 - 05/19/17 minireassess on 04/28/17    PT Start Time  1606    PT Stop Time  1645    PT Time Calculation (min)  39 min    Activity Tolerance  Patient tolerated treatment well;Patient limited by pain;No increased pain    Behavior During Therapy  WFL for tasks assessed/performed       Past Medical History:  Diagnosis Date  . Diabetes mellitus without complication ([place])   . History of TIAs   . Palpitations   . Polycystic ovarian disease   . Tobacco abuse     Past Surgical History:  Procedure Laterality Date  . TUBAL LIGATION      There were no vitals filed for this visit.  Subjective Assessment - 04/26/17 1609    Subjective  Pt stated she has been on feet a lot today, pain scale 5/10 quad muscle is burning and increased swelling.    Patient Stated Goals  want to have 100% recovery and get back to normal    Currently in Pain?  Yes    Pain Score  5     Pain Location  Knee    Pain Orientation  Left    Pain Descriptors / Indicators  Aching;Sore    Pain Type  Surgical pain    Pain Onset  More than a month ago    Pain Frequency  Constant    Aggravating Factors   walking, bending her knee    Pain Relieving Factors  rest, ice    Effect of Pain on Daily Activities  limits her at work         [facility] PT Assessment - 04/26/17 0001      Assessment   Medical Diagnosis  Decreased strength and flexibility of LE    Referring Provider  Magda Bernheim MD    Onset  Date/Surgical Date  02/18/17    Next MD Visit  unsure, ~4 more weeks    Prior Therapy  none      Precautions   Precautions  None      Observation/Other Assessments-Edema    Edema  Circumferential 16.75' knee joint line                  [facility] Adult PT Treatment/Exercise - 04/26/17 0001      Knee/Hip Exercises: Stretches   Knee: Self-Stretch to increase Flexion  Left;10 seconds;Limitations    Knee: Self-Stretch Limitations  10 reps 12" step      Knee/Hip Exercises: Aerobic   Stationary Bike  3' seat 10 for mobility      Knee/Hip Exercises: Standing   Stairs  5RT reciprocal pattern working on eccentric control      Knee/Hip Exercises: Supine   Knee Flexion  AROM    Knee Flexion Limitations  115 following manual and bike (was 108 last session)      Manual Therapy   Manual Therapy  Edema management;Soft  tissue mobilization;Myofascial release;Other (comment)    Manual therapy comments  Performed seperate of other skilled interventions    Edema Management  retrograde massage to left knee joint    Joint Mobilization  patella mobs all directions    Soft tissue mobilization  parallell and cross friction massage to quadricep tendon and distal muscle belly of quadriceps on left LE    Myofascial Release  Scar massage to reduce adhesions    Other Manual Therapy  measurements taken for compression hose shoe8, ankle 10', calf 16'' and thigh 22'             PT Education - 04/26/17 1647    Education provided  Yes    Education Details  Educated on benefits of compression hose, measurements taken and paperwork given to pt    Person(s) Educated  Patient    Methods  Explanation;Handout    Comprehension  Verbalized understanding       PT Short Term Goals - 04/07/17 1734      PT SHORT TERM GOAL #1   Title  Patient will be independent with HEP and demonstrate proper form/technique to improve functional strength and flexibility.    Time  3    Period  Weeks    Status  New     Target Date  04/28/17      PT SHORT TERM GOAL #2   Title  Patient will improve left knee flexion by 10 degrees to demonstrate functional change in ROM for improved gait and stair mobility.     Time  3    Period  Weeks    Status  New      PT SHORT TERM GOAL #3   Title  Patient with improve MMT by 1/2 grade for limited muscle groups and be able to tolerate prone position for hip extensor testing demonstrating decreased sensitivity of left incision.     Time  3    Period  Weeks    Status  New        PT Long Term Goals - 04/07/17 1738      PT LONG TERM GOAL #1   Title  Patient with improve MMT by 1 grade for limited muscle groups to improve functional gait and stair mobility for improved activity tolerance at work.    Time  6    Period  Weeks    Status  New    Target Date  05/19/17      PT LONG TERM GOAL #2   Title  Patient will improve FOTO score by 12% to demonstrate a significant improvement in function and decreased limitations at work and [employer] daily activities.    Time  6    Period  Weeks    Status  New      PT LONG TERM GOAL #3   Title  Patient will improve left knee flexion by 15 degrees to demonstrate functional change in ROM for improved gait and stair mobility and to be able to return to gardening this spring/summer.    Time  6    Period  Weeks    Status  New            Plan - 04/26/17 1631    Clinical Impression Statement  Began session with manual techniques to address pain and edema present proximal knee, scar tissue adhesions and soft tissue restricitons especially rectus femoris.  Pt educated on benefits of compression hose, measurements taken and paperwork given.  Session focus on knee mobility  and functional strengthening.  Pt progressing well with ability to complete reciprocal pattern with good mechanics and minimal HHA, increased difficulty with step down per weak eccentric control and knee mobilty.  Improved AROM 0-115 degrees at EOS, reports pain  reduced 3/10.      Rehab Potential  Good    PT Frequency  3x / week    PT Duration  6 weeks    PT Treatment/Interventions  ADLs/Self Care Home Management;Gait training;Stair training;Functional mobility training;Moist Heat;Electrical Stimulation;Cryotherapy;Therapeutic activities;Therapeutic exercise;Balance training;Neuromuscular re-education;Patient/family education;Manual techniques;Scar mobilization;Passive range of motion;Energy conservation;Taping    PT Next Visit Plan  Minireassess next session.  F/U with compression hose purchase/answer questions as needed.  Continue retrograde massage to toleration for left knee. Progress ROM for knee flexion limitations and left hip strenghtening. 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    PT Home Exercise Plan  Eval: self knee flexion stretch; 04/12/17 - scar desensitization; 04/13/17 - contract relax quad stretch in prone, SAQ; 3/8: inferior patella glides       Patient will benefit from skilled therapeutic intervention in order to improve the following deficits and impairments:  Abnormal gait, Decreased skin integrity, Increased fascial restricitons, Pain, Impaired sensation, Improper body mechanics, Decreased mobility, Decreased scar mobility, Increased muscle spasms, Postural dysfunction, Hypomobility, Decreased strength, Decreased range of motion, Decreased activity tolerance, Decreased balance, Difficulty walking, Increased edema, Impaired flexibility  Visit Diagnosis: Stiffness of left knee, not elsewhere classified  Other abnormalities of gait and mobility  Other symptoms and signs involving the musculoskeletal system  Localized edema     Problem List Patient Active Problem List   Diagnosis Date Noted  . Tobacco abuse   . TOBACCO ABUSE 04/22/2010  . DIZZINESS 04/21/2010  . FATIGUE 04/21/2010  . NIGHT SWEATS 04/21/2010  . HEADACHE 04/21/2010  . PALPITATIONS 04/21/2010  . SHORTNESS OF BREATH  04/21/2010  . DIABETES MELLITUS, BORDERLINE 04/21/2010   Ihor Austin, LPTA; CBIS [phone]  Aldona Lento 04/26/2017, 6:47 PM  [place] [address] [place], [place], [address] Phone: [phone]   Fax:  [phone]  Name: Gwendolyn [name] MRN: [id number] Date of Birth: February 09, 1966

## 2017-04-27 ENCOUNTER — Other Ambulatory Visit: Payer: Self-pay

## 2017-04-27 ENCOUNTER — Ambulatory Visit (HOSPITAL_COMMUNITY): Payer: BLUE CROSS/BLUE SHIELD

## 2017-04-27 ENCOUNTER — Encounter (HOSPITAL_COMMUNITY): Payer: Self-pay

## 2017-04-27 DIAGNOSIS — R600 Localized edema: Secondary | ICD-10-CM

## 2017-04-27 DIAGNOSIS — R2689 Other abnormalities of gait and mobility: Secondary | ICD-10-CM

## 2017-04-27 DIAGNOSIS — M25662 Stiffness of left knee, not elsewhere classified: Secondary | ICD-10-CM

## 2017-04-27 DIAGNOSIS — R29898 Other symptoms and signs involving the musculoskeletal system: Secondary | ICD-10-CM

## 2017-04-27 NOTE — Therapy (Signed)
[place], [place], [address] Phone: [phone]   Fax:  [phone]  Physical Therapy Treatment/Re-Assessment  Patient Details  Name: Gwendolyn [name] MRN: [id number] Date of Birth: 1965/05/06 Referring Provider: Magda Bernheim MD   Encounter Date: 04/27/2017  PT End of Session - 04/27/17 1538    Visit Number  8    Number of Visits  17    Date for PT Re-Evaluation  05/19/17    Authorization Type  [employer] [place]    Authorization Time Period  04/07/17 - 05/19/17 minireassess on 04/28/17    PT Start Time  1521    PT Stop Time  1610    PT Time Calculation (min)  49 min    Activity Tolerance  Patient tolerated treatment well;No increased pain    Behavior During Therapy  WFL for tasks assessed/performed       Past Medical History:  Diagnosis Date  . Diabetes mellitus without complication ([place])   . History of TIAs   . Palpitations   . Polycystic ovarian disease   . Tobacco abuse     Past Surgical History:  Procedure Laterality Date  . TUBAL LIGATION      There were no vitals filed for this visit.  Subjective Assessment - 04/27/17 1632    Subjective  Patient is feeling well overall today but tired. She states she has not called the compression garment store yet but planes to after today's session.    Limitations  Sitting;Lifting;Standing;Walking;House hold activities    Patient Stated Goals  want to have 100% recovery and get back to normal    Currently in Pain?  Yes    Pain Score  5     Pain Location  Knee    Pain Orientation  Left    Pain Descriptors / Indicators  Aching;Sore    Pain Type  Surgical pain    Pain Onset  More than a month ago    Pain Frequency  Constant         [facility] PT Assessment - 04/27/17 0001      Assessment   Medical Diagnosis  Decreased strength and flexibility of LE    Referring Provider  Magda Bernheim MD    Onset Date/Surgical Date  02/18/17      Observation/Other  Assessments   Focus on Therapeutic Outcomes (FOTO)   56% limited was70% limited      Single Leg Stance   Comments  Rt LE = 26 seconds, Lt LE = 25 seconds      AROM   Left Knee Extension  0    Left Knee Flexion  110      Strength   Right Hip Flexion  4/5    Right Hip Extension  4/5    Right Hip ABduction  5/5    Left Hip Flexion  4/5    Left Hip Extension  4/5    Left Hip ABduction  4+/5    Right Knee Flexion  5/5    Right Knee Extension  5/5    Left Knee Flexion  5/5    Left Knee Extension  5/5    Right Ankle Dorsiflexion  4+/5    Right Ankle Plantar Flexion  4/5    Left Ankle Dorsiflexion  5/5    Left Ankle Plantar Flexion  4/5       [facility] Adult PT Treatment/Exercise - 04/27/17 0001      Knee/Hip Exercises: Standing  Forward Lunges  Both;2 sets;15 reps    Lateral Step Up  Left;15 reps;Step Height: 4";2 sets;Hand Hold: 0    Step Down  15 reps;Step Height: 2";Hand Hold: 2;Left;2 sets fingertip support      Knee/Hip Exercises: Prone   Contract/Relax to Increase Flexion  --      Manual Therapy   Manual Therapy  Edema management;Soft tissue mobilization;Myofascial release    Manual therapy comments  Performed seperate of other skilled interventions    Edema Management  retrograde massage to left knee joint    Joint Mobilization  patella mobs all directions    Soft tissue mobilization  parallell and cross friction massage to quadricep tendon and distal muscle belly of quadriceps on left LE    Myofascial Release  Scar massage to reduce adhesions        PT Education - 04/26/17 1647    Education provided  Yes    Education Details  Educated on benefits of compression hose, measurements taken and paperwork given to pt    Person(s) Educated  Patient    Methods  Explanation;Handout    Comprehension  Verbalized understanding       PT Short Term Goals - 04/27/17 1526      PT SHORT TERM GOAL #1   Title  Patient will be independent with HEP and demonstrate proper  form/technique to improve functional strength and flexibility.    Time  3    Period  Weeks    Status  Achieved      PT SHORT TERM GOAL #2   Title  Patient will improve left knee flexion by 10 degrees to demonstrate functional change in ROM for improved gait and stair mobility.     Baseline  04/27/17 - ROM 0-110 (was 0-80)    Time  3    Period  Weeks    Status  Achieved      PT SHORT TERM GOAL #3   Title  Patient with improve MMT by 1/2 grade for limited muscle groups and be able to tolerate prone position for hip extensor testing demonstrating decreased sensitivity of left incision.     Baseline  04/27/17 - all improve by 1/2 grade or is already 5/5    Time  3    Period  Weeks    Status  Achieved        PT Long Term Goals - 04/27/17 1526      PT LONG TERM GOAL #1   Title  Patient with improve MMT by 1 grade for limited muscle groups to improve functional gait and stair mobility for improved activity tolerance at work.    Time  6    Period  Weeks    Status  On-going      PT LONG TERM GOAL #2   Title  Patient will improve FOTO score to 44% limitation or less to demonstrate a significant improvement in function and decreased limitations at work and during daily activities.    Baseline  04/27/17 - 56% limited (was 70% limited)    Time  6    Period  Weeks    Status  Revised achieved      PT LONG TERM GOAL #3   Title  Patient will improve left knee flexion to 0-120 degrees to demonstrate functional change in ROM for improved gait and stair mobility and to be able to return to gardening this spring/summer.    Baseline  04/27/17 - ROM 0-110 (was 0-80)  Time  6    Period  Weeks    Status  Revised      PT LONG TERM GOAL #4   Title  Patient will perform 5 reps for step down test with 6" step and 1 HHA for balance to demonstrate improved eccentric control and strength with Lt LE.    Time  3    Status  New    Target Date  05/18/17         Plan - 04/27/17 1625    Clinical  Impression Statement  Re-assessment performed today and patient has met 3/3 short term goals and 2/3 long term goals. Two of her long-term goals were revised to progress further towards her PLOF given her excellent progress at this time. She has made a significant improvement in ROM from 0-80 degrees at evaluation to 0-110 degrees AROM of Lt knee. She has demonstrated improved MMT for bilateral LE but continues to have decreased eccentric control of left quadriceps secondary to pain and edema. She has been educated on obtaining a compression garment to manage edema to further reduce pain and progress motion. She will continue to benefit from skilled PT services to address impairments and progress towards PLOF.    Rehab Potential  Good    PT Frequency  3x / week    PT Duration  6 weeks    PT Treatment/Interventions  ADLs/Self Care Home Management;Gait training;Stair training;Functional mobility training;Moist Heat;Electrical Stimulation;Cryotherapy;Therapeutic activities;Therapeutic exercise;Balance training;Neuromuscular re-education;Patient/family education;Manual techniques;Scar mobilization;Passive range of motion;Energy conservation;Taping    PT Next Visit Plan  F/U with compression hose purchase/answer questions as needed.  Continue retrograde massage to toleration for left knee. Progress ROM for knee flexion limitations and left hip strenghtening. 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 Initiate mini-squat exercises and eccentric step down on 2" step for Lt LE.    PT Home Exercise Plan  Eval: self knee flexion stretch; 04/12/17 - scar desensitization; 04/13/17 - contract relax quad stretch in prone, SAQ; 3/8: inferior patella glides    Consulted and Agree with Plan of Care  Patient       Patient will benefit from skilled therapeutic intervention in order to improve the following deficits and impairments:  Abnormal gait, Decreased skin integrity, Increased fascial  restricitons, Pain, Impaired sensation, Improper body mechanics, Decreased mobility, Decreased scar mobility, Increased muscle spasms, Postural dysfunction, Hypomobility, Decreased strength, Decreased range of motion, Decreased activity tolerance, Decreased balance, Difficulty walking, Increased edema, Impaired flexibility  Visit Diagnosis: Stiffness of left knee, not elsewhere classified  Other abnormalities of gait and mobility  Other symptoms and signs involving the musculoskeletal system  Localized edema     Problem List Patient Active Problem List   Diagnosis Date Noted  . Tobacco abuse   . TOBACCO ABUSE 04/22/2010  . DIZZINESS 04/21/2010  . FATIGUE 04/21/2010  . NIGHT SWEATS 04/21/2010  . HEADACHE 04/21/2010  . PALPITATIONS 04/21/2010  . SHORTNESS OF BREATH 04/21/2010  . DIABETES MELLITUS, BORDERLINE 04/21/2010    Kipp Brood, PT, DPT Physical Therapist with [facility]  04/27/2017 4:33 PM    [facility] [address] [place], [place], [address] Phone: [phone]   Fax:  [phone]  Name: JOUD PETTINATO MRN: [id number] Date of Birth: 20-Nov-1965

## 2017-04-29 ENCOUNTER — Ambulatory Visit (HOSPITAL_COMMUNITY): Payer: BLUE CROSS/BLUE SHIELD

## 2017-04-29 ENCOUNTER — Encounter (HOSPITAL_COMMUNITY): Payer: Self-pay

## 2017-04-29 DIAGNOSIS — R600 Localized edema: Secondary | ICD-10-CM

## 2017-04-29 DIAGNOSIS — R2689 Other abnormalities of gait and mobility: Secondary | ICD-10-CM

## 2017-04-29 DIAGNOSIS — M25662 Stiffness of left knee, not elsewhere classified: Secondary | ICD-10-CM

## 2017-04-29 DIAGNOSIS — R29898 Other symptoms and signs involving the musculoskeletal system: Secondary | ICD-10-CM

## 2017-04-29 NOTE — Therapy (Signed)
[place], [place], [address] Phone: [phone]   Fax:  [phone]  Physical Therapy Treatment  Patient Details  Name: Gwendolyn [name] MRN: [id number] Date of Birth: [birth year] Referring Provider: Magda Bernheim MD   Encounter Date: 04/29/2017  PT End of Session - 04/29/17 1540    Visit Number  9    Number of Visits  17    Date for PT Re-Evaluation  05/19/17    Authorization Type  [employer]    Authorization Time Period  04/07/17 - 05/19/17    PT Start Time  1518    PT Stop Time  1556    PT Time Calculation (min)  38 min    Activity Tolerance  Patient tolerated treatment well;No increased pain    Behavior During Therapy  WFL for tasks assessed/performed       Past Medical History:  Diagnosis Date  . Diabetes mellitus without complication ([place])   . History of TIAs   . Palpitations   . Polycystic ovarian disease   . Tobacco abuse     Past Surgical History:  Procedure Laterality Date  . TUBAL LIGATION      There were no vitals filed for this visit.  Subjective Assessment - 04/29/17 1519    Subjective  Pt stated she is having difficulty getting through to the compression garmet store to order.  Reports she has intermittent sharp pain on lateral aspect of knee and some dull achey pain today, pain scale 5-10.    Patient Stated Goals  want to have 100% recovery and get back to normal    Currently in Pain?  Yes    Pain Score  5     Pain Location  Knee    Pain Orientation  Left    Pain Descriptors / Indicators  Sharp;Aching;Dull    Pain Type  Surgical pain    Pain Onset  More than a month ago    Pain Frequency  Constant    Aggravating Factors   walking, bending her knee    Pain Relieving Factors  rest, ice    Effect of Pain on Daily Activities  limits her at work                      [facility] Adult PT Treatment/Exercise - 04/29/17 0001      Knee/Hip Exercises: Standing    Lateral Step Up  Left;15 reps;Hand Hold: 0;Step Height: 6"    Forward Step Up  Left;15 reps;Hand Hold: 0;Step Height: 6"    Step Down  Left;15 reps;Hand Hold: 0;Step Height: 4"    Functional Squat  2 sets;10 reps STS from toilet; 2nd set tapping chair    SLS  Lt 51", Rt 21" max of 3    SLS with Vectors  2x 5" holds BLE    Other Standing Knee Exercises  sidestep RTB       Manual Therapy   Manual Therapy  Edema management;Soft tissue mobilization;Myofascial release    Manual therapy comments  Performed seperate of other skilled interventions    Edema Management  retrograde massage to left knee joint    Soft tissue mobilization  parallell and cross friction massage to quadricep tendon and distal muscle belly of quadriceps on left LE    Myofascial Release  Scar massage to reduce adhesions               PT Short Term Goals -  04/27/17 1526      PT SHORT TERM GOAL #1   Title  Patient will be independent with HEP and demonstrate proper form/technique to improve functional strength and flexibility.    Time  3    Period  Weeks    Status  Achieved      PT SHORT TERM GOAL #2   Title  Patient will improve left knee flexion by 10 degrees to demonstrate functional change in ROM for improved gait and stair mobility.     Baseline  04/27/17 - ROM 0-110 (was 0-80)    Time  3    Period  Weeks    Status  Achieved      PT SHORT TERM GOAL #3   Title  Patient with improve MMT by 1/2 grade for limited muscle groups and be able to tolerate prone position for hip extensor testing demonstrating decreased sensitivity of left incision.     Baseline  04/27/17 - all improve by 1/2 grade or is already 5/5    Time  3    Period  Weeks    Status  Achieved        PT Long Term Goals - 04/27/17 1526      PT LONG TERM GOAL #1   Title  Patient with improve MMT by 1 grade for limited muscle groups to improve functional gait and stair mobility for improved activity tolerance at work.    Time  6    Period   Weeks    Status  On-going      PT LONG TERM GOAL #2   Title  Patient will improve FOTO score to 44% limitation or less to demonstrate a significant improvement in function and decreased limitations at work and during daily activities.    Baseline  04/27/17 - 56% limited (was 70% limited)    Time  6    Period  Weeks    Status  Revised achieved      PT LONG TERM GOAL #3   Title  Patient will improve left knee flexion to 0-120 degrees to demonstrate functional change in ROM for improved gait and stair mobility and to be able to return to gardening this spring/summer.    Baseline  04/27/17 - ROM 0-110 (was 0-80)    Time  6    Period  Weeks    Status  Revised      PT LONG TERM GOAL #4   Title  Patient will perform 5 reps for step down test with 6" step and 1 HHA for balance to demonstrate improved eccentric control and strength with Lt LE.    Time  3    Status  New    Target Date  05/18/17            Plan - 04/29/17 1557    Clinical Impression Statement  Pt continues to have edema proximal knee limiting mobility and pain.  Began session with manual retro massage for edema control and soft tissue mobilization to address restricitons in quadriceps.  Therex focus on functional strengthening, pt tolerated very well to session.  Reviewed mechanics with controlled sit to stand and began squats with good form following initial cueing.  Able to increase height wiht lateral step ups and step down for functional strengthening with good control noted.  Reports of pain reduced at EOS.      Rehab Potential  Good    PT Frequency  3x / week    PT Duration  6  weeks    PT Treatment/Interventions  ADLs/Self Care Home Management;Gait training;Stair training;Functional mobility training;Moist Heat;Electrical Stimulation;Cryotherapy;Therapeutic activities;Therapeutic exercise;Balance training;Neuromuscular re-education;Patient/family education;Manual techniques;Scar mobilization;Passive range of motion;Energy  conservation;Taping    PT Next Visit Plan  F/U with compression hose purchase/answer questions as needed.  Continue retrograde massage to toleration for left knee. Progress ROM for knee flexion limitations and left hip strenghtening. 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 Continue with mini-squat exercises and eccentric step down on " step for Lt LE.    PT Home Exercise Plan  Eval: self knee flexion stretch; 04/12/17 - scar desensitization; 04/13/17 - contract relax quad stretch in prone, SAQ; 3/8: inferior patella glides       Patient will benefit from skilled therapeutic intervention in order to improve the following deficits and impairments:  Abnormal gait, Decreased skin integrity, Increased fascial restricitons, Pain, Impaired sensation, Improper body mechanics, Decreased mobility, Decreased scar mobility, Increased muscle spasms, Postural dysfunction, Hypomobility, Decreased strength, Decreased range of motion, Decreased activity tolerance, Decreased balance, Difficulty walking, Increased edema, Impaired flexibility  Visit Diagnosis: Stiffness of left knee, not elsewhere classified  Other abnormalities of gait and mobility  Other symptoms and signs involving the musculoskeletal system  Localized edema     Problem List Patient Active Problem List   Diagnosis Date Noted  . Tobacco abuse   . TOBACCO ABUSE 04/22/2010  . DIZZINESS 04/21/2010  . FATIGUE 04/21/2010  . NIGHT SWEATS 04/21/2010  . HEADACHE 04/21/2010  . PALPITATIONS 04/21/2010  . SHORTNESS OF BREATH 04/21/2010  . DIABETES MELLITUS, BORDERLINE 04/21/2010   Ihor Austin, LPTA; CBIS [phone]  Aldona Lento 04/29/2017, 4:03 PM  [facility] [address] [place], [place], [address] Phone: [phone]   Fax:  [phone]  Name: JULIEANA ESHLEMAN MRN: [id number] Date of Birth: [birth year]

## 2017-05-02 ENCOUNTER — Ambulatory Visit (HOSPITAL_COMMUNITY): Payer: BLUE CROSS/BLUE SHIELD

## 2017-05-02 ENCOUNTER — Other Ambulatory Visit: Payer: Self-pay

## 2017-05-02 ENCOUNTER — Encounter (HOSPITAL_COMMUNITY): Payer: Self-pay

## 2017-05-02 DIAGNOSIS — R29898 Other symptoms and signs involving the musculoskeletal system: Secondary | ICD-10-CM

## 2017-05-02 DIAGNOSIS — R2689 Other abnormalities of gait and mobility: Secondary | ICD-10-CM

## 2017-05-02 DIAGNOSIS — M25662 Stiffness of left knee, not elsewhere classified: Secondary | ICD-10-CM | POA: Diagnosis not present

## 2017-05-02 DIAGNOSIS — R600 Localized edema: Secondary | ICD-10-CM

## 2017-05-02 NOTE — Therapy (Signed)
[facility] [place], [place], [address] Phone: [phone]   Fax:  [phone]  Physical Therapy Treatment  Patient Details  Name: Gwendolyn [name] MRN: [id number] Date of Birth: January 14, 1966 Referring Provider: Magda Bernheim MD   Encounter Date: 05/02/2017  PT End of Session - 05/02/17 1558    Visit Number  10    Number of Visits  17    Date for PT Re-Evaluation  05/19/17    Authorization Type  [employer] [place]    Authorization Time Period  04/07/17 - 05/19/17    PT Start Time  1522    PT Stop Time  1602    PT Time Calculation (min)  40 min    Activity Tolerance  Patient tolerated treatment well;No increased pain    Behavior During Therapy  WFL for tasks assessed/performed       Past Medical History:  Diagnosis Date  . Diabetes mellitus without complication ([place])   . History of TIAs   . Palpitations   . Polycystic ovarian disease   . Tobacco abuse     Past Surgical History:  Procedure Laterality Date  . TUBAL LIGATION      There were no vitals filed for this visit.  Subjective Assessment - 05/02/17 1544    Subjective  Patient states she is still having difficulty getting in touch with compression garment store and that she might try their website. She reports pain is around a 5 today. 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     Limitations  Sitting;Lifting;Standing;Walking;House hold activities    Patient Stated Goals  want to have 100% recovery and get back to normal    Currently in Pain?  Yes    Pain Score  5     Pain Location  Knee    Pain Orientation  Left    Pain Descriptors / Indicators  Aching;Dull    Pain Onset  More than a month ago    Pain Frequency  Constant        [facility] Adult PT Treatment/Exercise - 05/02/17 0001      Knee/Hip Exercises: Standing   Forward Lunges  Both;2 sets;15 reps BOSU, ball side up    Lateral Step Up   Left;15 reps;Hand Hold: 0;Step Height: 6";2 sets    Forward Step Up  Left;15 reps;Hand Hold: 0;Step Height: 6";2 sets    Step Down  Left;15 reps;Step Height: 4";Hand Hold: 2 fingertip support    Rocker Board  2 minutes;Limitations    Rocker Board Limitations  2x 1 minute lateral,       Manual Therapy   Manual Therapy  Edema management;Soft tissue mobilization;Myofascial release    Manual therapy comments  Performed seperate of other skilled interventions    Edema Management  retrograde massage to left knee joint    Soft tissue mobilization  parallell and cross friction massage to quadricep tendon and distal muscle belly of quadriceps on left LE        PT Education - 05/02/17 1547    Education provided  Yes    Education Details  Educated on exercise form/technique throughout session.    Person(s) Educated  Patient    Methods  Explanation    Comprehension  Verbalized understanding       PT Short Term Goals - 04/27/17 1526      PT SHORT TERM GOAL #1  Title  Patient will be independent with HEP and demonstrate proper form/technique to improve functional strength and flexibility.    Time  3    Period  Weeks    Status  Achieved      PT SHORT TERM GOAL #2   Title  Patient will improve left knee flexion by 10 degrees to demonstrate functional change in ROM for improved gait and stair mobility.     Baseline  04/27/17 - ROM 0-110 (was 0-80)    Time  3    Period  Weeks    Status  Achieved      PT SHORT TERM GOAL #3   Title  Patient with improve MMT by 1/2 grade for limited muscle groups and be able to tolerate prone position for hip extensor testing demonstrating decreased sensitivity of left incision.     Baseline  04/27/17 - all improve by 1/2 grade or is already 5/5    Time  3    Period  Weeks    Status  Achieved        PT Long Term Goals - 04/27/17 1526      PT LONG TERM GOAL #1   Title  Patient with improve MMT by 1 grade for limited muscle groups to improve functional  gait and stair mobility for improved activity tolerance at work.    Time  6    Period  Weeks    Status  On-going      PT LONG TERM GOAL #2   Title  Patient will improve FOTO score to 44% limitation or less to demonstrate a significant improvement in function and decreased limitations at work and during daily activities.    Baseline  04/27/17 - 56% limited (was 70% limited)    Time  6    Period  Weeks    Status  Revised achieved      PT LONG TERM GOAL #3   Title  Patient will improve left knee flexion to 0-120 degrees to demonstrate functional change in ROM for improved gait and stair mobility and to be able to return to gardening this spring/summer.    Baseline  04/27/17 - ROM 0-110 (was 0-80)    Time  6    Period  Weeks    Status  Revised      PT LONG TERM GOAL #4   Title  Patient will perform 5 reps for step down test with 6" step and 1 HHA for balance to demonstrate improved eccentric control and strength with Lt LE.    Time  3    Status  New    Target Date  05/18/17        Plan - 05/02/17 1608    Clinical Impression Statement  Patient has ongoing edema around proximal knee limiting mobility. Therefore, start of session focused on manual interventions of retro massage for edema and soft tissue mobilization to address myofascial restrictions in quadriceps. She was able to progress functional strengthening with no increase in pain however, visible muscle fatigue with shaking during eccentric quad strengthening on Lt LE. Introduced rockerboard for proprioception and eccentric control, patient reported decreased stiffness following exercises. She will continue to benefit from skilled PT services to address the above impairments, progress towards goals, decrease pain, and improve overall QOL and mobility to return to PLOF.    Rehab Potential  Good    PT Frequency  3x / week    PT Duration  6 weeks    PT Treatment/Interventions  ADLs/Self Care Home Management;Gait training;Stair  training;Functional mobility training;Moist Heat;Electrical Stimulation;Cryotherapy;Therapeutic activities;Therapeutic exercise;Balance training;Neuromuscular re-education;Patient/family education;Manual techniques;Scar mobilization;Passive range of motion;Energy conservation;Taping    PT Next Visit Plan  F/U with compression hose purchase/answer questions as needed.  Continue retrograde massage to toleration for left knee. Progress ROM for knee flexion limitations and left hip strenghtening. 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 Continue with mini-squat exercises and eccentric step down on " step for Lt LE. Progress step downs with increased sets/repetitions. Introduce latearl lunge for control and bike for ROM.    PT Home Exercise Plan  Eval: self knee flexion stretch; 04/12/17 - scar desensitization; 04/13/17 - contract relax quad stretch in prone, SAQ; 3/8: inferior patella glides    Consulted and Agree with Plan of Care  Patient       Patient will benefit from skilled therapeutic intervention in order to improve the following deficits and impairments:  Abnormal gait, Decreased skin integrity, Increased fascial restricitons, Pain, Impaired sensation, Improper body mechanics, Decreased mobility, Decreased scar mobility, Increased muscle spasms, Postural dysfunction, Hypomobility, Decreased strength, Decreased range of motion, Decreased activity tolerance, Decreased balance, Difficulty walking, Increased edema, Impaired flexibility  Visit Diagnosis: Stiffness of left knee, not elsewhere classified  Other abnormalities of gait and mobility  Other symptoms and signs involving the musculoskeletal system  Localized edema     Problem List Patient Active Problem List   Diagnosis Date Noted  . Tobacco abuse   . TOBACCO ABUSE 04/22/2010  . DIZZINESS 04/21/2010  . FATIGUE 04/21/2010  . NIGHT SWEATS 04/21/2010  . HEADACHE 04/21/2010  . PALPITATIONS  04/21/2010  . SHORTNESS OF BREATH 04/21/2010  . DIABETES MELLITUS, BORDERLINE 04/21/2010    Kipp Brood, PT, DPT Physical Therapist with [facility]  05/02/2017 4:12 PM    [facility] [address] [place], [place], [address] Phone: [phone]   Fax:  [phone]  Name: Gwendolyn [name] MRN: [id number] Date of Birth: 1965/08/02

## 2017-05-03 ENCOUNTER — Telehealth (HOSPITAL_COMMUNITY): Payer: Self-pay

## 2017-05-03 NOTE — Telephone Encounter (Signed)
Patient have to work and needed to cancel

## 2017-05-04 ENCOUNTER — Encounter (HOSPITAL_COMMUNITY): Payer: BLUE CROSS/BLUE SHIELD

## 2017-05-06 ENCOUNTER — Ambulatory Visit (HOSPITAL_COMMUNITY): Payer: BLUE CROSS/BLUE SHIELD

## 2017-05-06 ENCOUNTER — Encounter (HOSPITAL_COMMUNITY): Payer: Self-pay

## 2017-05-06 DIAGNOSIS — R29898 Other symptoms and signs involving the musculoskeletal system: Secondary | ICD-10-CM

## 2017-05-06 DIAGNOSIS — R2689 Other abnormalities of gait and mobility: Secondary | ICD-10-CM

## 2017-05-06 DIAGNOSIS — M25662 Stiffness of left knee, not elsewhere classified: Secondary | ICD-10-CM | POA: Diagnosis not present

## 2017-05-06 DIAGNOSIS — R600 Localized edema: Secondary | ICD-10-CM

## 2017-05-06 NOTE — Therapy (Signed)
[facility] [place], [place], [address] Phone: [phone]   Fax:  [phone]  Physical Therapy Treatment  Patient Details  Name: Gwendolyn [name] MRN: [id number] Date of Birth: 09/05/1965 Referring Provider: Magda Bernheim MD   Encounter Date: 05/06/2017  PT End of Session - 05/06/17 1552    Visit Number  11    Number of Visits  17    Date for PT Re-Evaluation  05/19/17    Authorization Type  [employer] [place]    Authorization Time Period  04/07/17 - 05/19/17    PT Start Time  1525    PT Stop Time  1608    PT Time Calculation (min)  43 min    Activity Tolerance  Patient tolerated treatment well;No increased pain    Behavior During Therapy  WFL for tasks assessed/performed       Past Medical History:  Diagnosis Date  . Diabetes mellitus without complication ([place])   . History of TIAs   . Palpitations   . Polycystic ovarian disease   . Tobacco abuse     Past Surgical History:  Procedure Laterality Date  . TUBAL LIGATION      There were no vitals filed for this visit.  Subjective Assessment - 05/06/17 1530    Subjective  Pt stated she has difficulty getting in touch with compression garment store.  Current pain scale 4/10 dull achey pain today.  Stated she has been training at work, on her feet a lot with increased swelling today.      Patient Stated Goals  want to have 100% recovery and get back to normal    Currently in Pain?  Yes    Pain Score  4     Pain Location  Knee    Pain Orientation  Left    Pain Descriptors / Indicators  Aching    Pain Type  Surgical pain    Pain Onset  More than a month ago    Pain Frequency  Constant    Aggravating Factors   walking, bending her knee    Pain Relieving Factors  rest, ice    Effect of Pain on Daily Activities  limits her at work         [facility] PT Assessment - 05/06/17 0001      Assessment   Medical Diagnosis  Decreased strength and flexibility of LE     Referring Provider  Magda Bernheim MD    Onset Date/Surgical Date  02/18/17    Next MD Visit  unsure, ~4 more weeks      Observation/Other Assessments-Edema    Edema  -- shoe 8, ankle 9.75", calf 15.25" and thigh 20.75"            No data recorded       [facility] Adult PT Treatment/Exercise - 05/06/17 0001      Knee/Hip Exercises: Aerobic   Stationary Bike  4' seat 8 for mobility      Knee/Hip Exercises: Standing   Forward Lunges  Both;15 reps lateral lunges    Lateral Step Up  Left;15 reps;Hand Hold: 1;Step Height: 6"    Step Down  Left;15 reps;Hand Hold: 1;Step Height: 6"    Functional Squat  15 reps    SLS with Vectors  5x 5" on foam minimal HHA      Manual Therapy   Manual Therapy  Edema management;Soft tissue mobilization;Myofascial release    Manual therapy comments  Performed  seperate of other skilled interventions    Edema Management  retrograde massage to left knee joint    Soft tissue mobilization  parallell and cross friction massage to quadricep tendon and distal muscle belly of quadriceps on left LE               PT Short Term Goals - 04/27/17 1526      PT SHORT TERM GOAL #1   Title  Patient will be independent with HEP and demonstrate proper form/technique to improve functional strength and flexibility.    Time  3    Period  Weeks    Status  Achieved      PT SHORT TERM GOAL #2   Title  Patient will improve left knee flexion by 10 degrees to demonstrate functional change in ROM for improved gait and stair mobility.     Baseline  04/27/17 - ROM 0-110 (was 0-80)    Time  3    Period  Weeks    Status  Achieved      PT SHORT TERM GOAL #3   Title  Patient with improve MMT by 1/2 grade for limited muscle groups and be able to tolerate prone position for hip extensor testing demonstrating decreased sensitivity of left incision.     Baseline  04/27/17 - all improve by 1/2 grade or is already 5/5    Time  3    Period  Weeks    Status  Achieved         PT Long Term Goals - 04/27/17 1526      PT LONG TERM GOAL #1   Title  Patient with improve MMT by 1 grade for limited muscle groups to improve functional gait and stair mobility for improved activity tolerance at work.    Time  6    Period  Weeks    Status  On-going      PT LONG TERM GOAL #2   Title  Patient will improve FOTO score to 44% limitation or less to demonstrate a significant improvement in function and decreased limitations at work and during daily activities.    Baseline  04/27/17 - 56% limited (was 70% limited)    Time  6    Period  Weeks    Status  Revised achieved      PT LONG TERM GOAL #3   Title  Patient will improve left knee flexion to 0-120 degrees to demonstrate functional change in ROM for improved gait and stair mobility and to be able to return to gardening this spring/summer.    Baseline  04/27/17 - ROM 0-110 (was 0-80)    Time  6    Period  Weeks    Status  Revised      PT LONG TERM GOAL #4   Title  Patient will perform 5 reps for step down test with 6" step and 1 HHA for balance to demonstrate improved eccentric control and strength with Lt LE.    Time  3    Status  New    Target Date  05/18/17            Plan - 05/06/17 1613    Clinical Impression Statement  Pt stated she continues to have difficulty contacting the compression hose store, following discussion remeasured Lt LE and referral sent to MD for thigh high compression hose.  Began session with retro massage for edema control and soft tissue mobilization to address tightness of quad.  Therex focus wiht functional  strenghtening, able to increase height with step down for eccentric quad control and added lateral lunges for functional strengthening.  Min cueing for mechanics with new exercises.  EOS reports of pain reduced to 3/10 achey pain, visible muscle fatigue with tasks.      Rehab Potential  Good    PT Frequency  3x / week    PT Duration  6 weeks    PT Treatment/Interventions   ADLs/Self Care Home Management;Gait training;Stair training;Functional mobility training;Moist Heat;Electrical Stimulation;Cryotherapy;Therapeutic activities;Therapeutic exercise;Balance training;Neuromuscular re-education;Patient/family education;Manual techniques;Scar mobilization;Passive range of motion;Energy conservation;Taping    PT Next Visit Plan  F/U with referral for compression and if pt is able to contact store.  Continues wiht manual retro massage for edema and pain control.  Progress ROM for flexion and continues hip and knee strengtheing.  Progress step downs for eccentric control and lateral lunges for control.  Biike for mobility.      PT Home Exercise Plan  Eval: self knee flexion stretch; 04/12/17 - scar desensitization; 04/13/17 - contract relax quad stretch in prone, SAQ; 3/8: inferior patella glides       Patient will benefit from skilled therapeutic intervention in order to improve the following deficits and impairments:  Abnormal gait, Decreased skin integrity, Increased fascial restricitons, Pain, Impaired sensation, Improper body mechanics, Decreased mobility, Decreased scar mobility, Increased muscle spasms, Postural dysfunction, Hypomobility, Decreased strength, Decreased range of motion, Decreased activity tolerance, Decreased balance, Difficulty walking, Increased edema, Impaired flexibility  Visit Diagnosis: Stiffness of left knee, not elsewhere classified  Other abnormalities of gait and mobility  Other symptoms and signs involving the musculoskeletal system  Localized edema     Problem List Patient Active Problem List   Diagnosis Date Noted  . Tobacco abuse   . TOBACCO ABUSE 04/22/2010  . DIZZINESS 04/21/2010  . FATIGUE 04/21/2010  . NIGHT SWEATS 04/21/2010  . HEADACHE 04/21/2010  . PALPITATIONS 04/21/2010  . SHORTNESS OF BREATH 04/21/2010  . DIABETES MELLITUS, BORDERLINE 04/21/2010   Ihor Austin, LPTA; CBIS [phone]  Aldona Lento 05/06/2017, 4:19 PM  [facility] [place], [place], [address] Phone: [phone]   Fax:  [phone]  Name: Gwendolyn [name] MRN: [id number] Date of Birth: [birth year]

## 2017-05-07 ENCOUNTER — Other Ambulatory Visit: Payer: Self-pay | Admitting: Family Medicine

## 2017-05-07 DIAGNOSIS — E1165 Type 2 diabetes mellitus with hyperglycemia: Secondary | ICD-10-CM

## 2017-05-09 ENCOUNTER — Other Ambulatory Visit: Payer: Self-pay

## 2017-05-09 ENCOUNTER — Encounter (HOSPITAL_COMMUNITY): Payer: Self-pay

## 2017-05-09 ENCOUNTER — Ambulatory Visit (HOSPITAL_COMMUNITY): Payer: BLUE CROSS/BLUE SHIELD

## 2017-05-09 DIAGNOSIS — R600 Localized edema: Secondary | ICD-10-CM

## 2017-05-09 DIAGNOSIS — M25662 Stiffness of left knee, not elsewhere classified: Secondary | ICD-10-CM | POA: Diagnosis not present

## 2017-05-09 DIAGNOSIS — R2689 Other abnormalities of gait and mobility: Secondary | ICD-10-CM

## 2017-05-09 DIAGNOSIS — R29898 Other symptoms and signs involving the musculoskeletal system: Secondary | ICD-10-CM

## 2017-05-09 NOTE — Therapy (Signed)
[facility] [place], [place], [address] Phone: [phone]   Fax:  [phone]  Physical Therapy Treatment  Patient Details  Name: Gwendolyn [name] MRN: [id number] Date of Birth: [birth year] Referring Provider: Magda Bernheim MD   Encounter Date: 05/09/2017  PT End of Session - 05/09/17 1614    Visit Number  12    Number of Visits  17    Date for PT Re-Evaluation  05/19/17    Authorization Type  [employer] [place]    Authorization Time Period  04/07/17 - 05/19/17    PT Start Time  1523 patient changed before start of session    PT Stop Time  1605    PT Time Calculation (min)  42 min    Activity Tolerance  Patient tolerated treatment well;No increased pain    Behavior During Therapy  WFL for tasks assessed/performed       Past Medical History:  Diagnosis Date  . Diabetes mellitus without complication ([place])   . History of TIAs   . Palpitations   . Polycystic ovarian disease   . Tobacco abuse     Past Surgical History:  Procedure Laterality Date  . TUBAL LIGATION      There were no vitals filed for this visit.  Subjective Assessment - 05/09/17 1620    Subjective  Patient reports some pain after work today and states she feels like her swelling is getting worse still. She is worried she is regressing and would like her ROM to keep improving but feels like she is stuck.    Patient Stated Goals  want to have 100% recovery and get back to normal    Currently in Pain?  Yes    Pain Score  5     Pain Location  Knee    Pain Orientation  Left    Pain Descriptors / Indicators  Aching    Pain Type  Surgical pain    Pain Onset  More than a month ago    Pain Frequency  Constant    Aggravating Factors   walking, stretching    Pain Relieving Factors  rest elevation ice    Effect of Pain on Daily Activities  limited at work         No data recorded    [facility] Adult PT Treatment/Exercise - 05/09/17 0001      Knee/Hip  Exercises: Machines for Strengthening   Cybex Knee Extension  2x 10 reps, Lt LE with 1.5 plates, 1x 10 reps Rt LE with 2 plates      Knee/Hip Exercises: Standing   Forward Lunges  Both;15 reps;2 sets BOSU ball side upc    Step Down  Left;15 reps;Hand Hold: 1;Step Height: 4"    Rocker Board  2 minutes;Limitations    Rocker Board Limitations  2x 1 minute lateral,     SLS with Vectors  10 x 15 seconds on LT LE, on foam,       Manual Therapy   Manual Therapy  Edema management;Soft tissue mobilization;Myofascial release    Manual therapy comments  Performed seperate of other skilled interventions    Edema Management  retrograde massage to left knee joint    Soft tissue mobilization  parallell and cross friction massage to quadricep tendon and distal muscle belly of quadriceps on left LE        PT Education - 05/09/17 1603    Education provided  Yes    Education Details  Educated on elevation and proper use of wedge and educated on exercises throughout.     Person(s) Educated  Patient    Methods  Explanation    Comprehension  Verbalized understanding       PT Short Term Goals - 04/27/17 1526      PT SHORT TERM GOAL #1   Title  Patient will be independent with HEP and demonstrate proper form/technique to improve functional strength and flexibility.    Time  3    Period  Weeks    Status  Achieved      PT SHORT TERM GOAL #2   Title  Patient will improve left knee flexion by 10 degrees to demonstrate functional change in ROM for improved gait and stair mobility.     Baseline  04/27/17 - ROM 0-110 (was 0-80)    Time  3    Period  Weeks    Status  Achieved      PT SHORT TERM GOAL #3   Title  Patient with improve MMT by 1/2 grade for limited muscle groups and be able to tolerate prone position for hip extensor testing demonstrating decreased sensitivity of left incision.     Baseline  04/27/17 - all improve by 1/2 grade or is already 5/5    Time  3    Period  Weeks    Status   Achieved        PT Long Term Goals - 04/27/17 1526      PT LONG TERM GOAL #1   Title  Patient with improve MMT by 1 grade for limited muscle groups to improve functional gait and stair mobility for improved activity tolerance at work.    Time  6    Period  Weeks    Status  On-going      PT LONG TERM GOAL #2   Title  Patient will improve FOTO score to 44% limitation or less to demonstrate a significant improvement in function and decreased limitations at work and during daily activities.    Baseline  04/27/17 - 56% limited (was 70% limited)    Time  6    Period  Weeks    Status  Revised achieved      PT LONG TERM GOAL #3   Title  Patient will improve left knee flexion to 0-120 degrees to demonstrate functional change in ROM for improved gait and stair mobility and to be able to return to gardening this spring/summer.    Baseline  04/27/17 - ROM 0-110 (was 0-80)    Time  6    Period  Weeks    Status  Revised      PT LONG TERM GOAL #4   Title  Patient will perform 5 reps for step down test with 6" step and 1 HHA for balance to demonstrate improved eccentric control and strength with Lt LE.    Time  3    Status  New    Target Date  05/18/17        Plan - 05/09/17 1539    Clinical Impression Statement  Patient continues to present with edema around left knee and a referral was sent last visit to her MD to sign for approval. Start of session focused on manual interventions for edema and myofascial restrictions in lateral left quadriceps. She continues to have positive response with pain reductions following manual therapy. Exercises were progressed but more rest breaks were provided this session as patient reported increased pain at home  following last session.  Initiated isolated quad strengthening with cybex machine, patient left quad became visibly fatigued at 10 reps. She will continue to benefit from skilled PT services to address the above impairments, progress towards goals,  decrease pain, and improve overall QOL and mobility to return to PLOF.    Rehab Potential  Good    PT Frequency  3x / week    PT Duration  6 weeks    PT Treatment/Interventions  ADLs/Self Care Home Management;Gait training;Stair training;Functional mobility training;Moist Heat;Electrical Stimulation;Cryotherapy;Therapeutic activities;Therapeutic exercise;Balance training;Neuromuscular re-education;Patient/family education;Manual techniques;Scar mobilization;Passive range of motion;Energy conservation;Taping    PT Next Visit Plan  F/U with referral for compression garment to see if MD has signed. Give to patietn to take to store if they have. Continue with manual retro massage for edema and pain control.  Progress ROM for flexion and continues hip and knee strengtheing.  Progress step downs for eccentric control and lateral lunges for control.  Biike for mobility.      PT Home Exercise Plan  Eval: self knee flexion stretch; 04/12/17 - scar desensitization; 04/13/17 - contract relax quad stretch in prone, SAQ; 3/8: inferior patella glides    Consulted and Agree with Plan of Care  Patient       Patient will benefit from skilled therapeutic intervention in order to improve the following deficits and impairments:  Abnormal gait, Decreased skin integrity, Increased fascial restricitons, Pain, Impaired sensation, Improper body mechanics, Decreased mobility, Decreased scar mobility, Increased muscle spasms, Postural dysfunction, Hypomobility, Decreased strength, Decreased range of motion, Decreased activity tolerance, Decreased balance, Difficulty walking, Increased edema, Impaired flexibility  Visit Diagnosis: Stiffness of left knee, not elsewhere classified  Other abnormalities of gait and mobility  Other symptoms and signs involving the musculoskeletal system  Localized edema     Problem List Patient Active Problem List   Diagnosis Date Noted  . Tobacco abuse   . TOBACCO ABUSE 04/22/2010  .  DIZZINESS 04/21/2010  . FATIGUE 04/21/2010  . NIGHT SWEATS 04/21/2010  . HEADACHE 04/21/2010  . PALPITATIONS 04/21/2010  . SHORTNESS OF BREATH 04/21/2010  . DIABETES MELLITUS, BORDERLINE 04/21/2010    Kipp Brood, PT, DPT Physical Therapist with [facility]  05/09/2017 4:23 PM    [facility] [address] [place], [place], [address] Phone: [phone]   Fax:  [phone]  Name: JAREN KEARN MRN: [id number] Date of Birth: Jun 19, 1965

## 2017-05-11 ENCOUNTER — Ambulatory Visit (HOSPITAL_COMMUNITY): Payer: BLUE CROSS/BLUE SHIELD

## 2017-05-11 ENCOUNTER — Other Ambulatory Visit: Payer: Self-pay

## 2017-05-11 ENCOUNTER — Encounter (HOSPITAL_COMMUNITY): Payer: Self-pay

## 2017-05-11 DIAGNOSIS — R600 Localized edema: Secondary | ICD-10-CM

## 2017-05-11 DIAGNOSIS — R2689 Other abnormalities of gait and mobility: Secondary | ICD-10-CM

## 2017-05-11 DIAGNOSIS — M25662 Stiffness of left knee, not elsewhere classified: Secondary | ICD-10-CM | POA: Diagnosis not present

## 2017-05-11 DIAGNOSIS — R29898 Other symptoms and signs involving the musculoskeletal system: Secondary | ICD-10-CM

## 2017-05-11 NOTE — Therapy (Signed)
[place], [place], [address] Phone: [phone]   Fax:  [phone]  Physical Therapy Treatment  Patient Details  Name: Gwendolyn [name] MRN: [id number] Date of Birth: Jun 06, 1965 Referring Provider: Magda Bernheim MD   Encounter Date: 05/11/2017  PT End of Session - 05/11/17 1610    Visit Number  13    Number of Visits  17    Date for PT Re-Evaluation  05/19/17    Authorization Type  [employer] [place]    Authorization Time Period  04/07/17 - 05/19/17    PT Start Time  1523 patient changed at start of session    PT Stop Time  1602    PT Time Calculation (min)  39 min    Activity Tolerance  Patient tolerated treatment well;No increased pain    Behavior During Therapy  WFL for tasks assessed/performed       Past Medical History:  Diagnosis Date  . Diabetes mellitus without complication ([place])   . History of TIAs   . Palpitations   . Polycystic ovarian disease   . Tobacco abuse     Past Surgical History:  Procedure Laterality Date  . TUBAL LIGATION      There were no vitals filed for this visit.  Subjective Assessment - 05/11/17 1617    Subjective  Patient reports pain in her anterior thigh superior to the patella of her left leg. She states her swelling seems to be worse and she feels like her leg is more sensitive with stretching and her scar is sensitive to touch. She states she is frustrated because her only relief is when she is laying down and resting and that this is really limiting her QOL. She states she wishes she could get back into her garden again.     Limitations  Sitting;Lifting;Standing;Walking;House hold activities    Patient Stated Goals  want to have 100% recovery and get back to normal    Currently in Pain?  Yes    Pain Score  4     Pain Location  Knee    Pain Orientation  Left;Anterior    Pain Descriptors / Indicators  Aching;Constant;Tightness    Pain Type  Surgical pain    Pain  Onset  More than a month ago    Pain Frequency  Constant    Aggravating Factors   stretching, walking    Pain Relieving Factors  rest, laying down    Effect of Pain on Daily Activities  limits her at work and form leisure activities for enjoyment        No data recorded   [facility] Adult PT Treatment/Exercise - 05/11/17 0001      Knee/Hip Exercises: Supine   Knee Flexion  Limitations    Knee Flexion Limitations  110 and painful (112 and painfree after manual)      Manual Therapy   Manual Therapy  Edema management;Soft tissue mobilization;Myofascial release;Joint mobilization    Manual therapy comments  Performed seperate of other skilled interventions    Edema Management  retrograde massage to left knee joint    Joint Mobilization  3x 30-45 seconds in all planes for patellofemoral mobilization in open pack position for left knee    Soft tissue mobilization  parallell and cross friction massage to quadricep tendon and distal muscle belly of quadriceps on left LE    Myofascial Release  Scar massage to reduce adhesions        PT  Education - 05/11/17 1609    Education provided  Yes    Education Details  Provided refferal form for compression garment to purchase at [employer]. Instructed to ice and perform patella self mobilization to improve mobility.     Person(s) Educated  Patient    Methods  Explanation    Comprehension  Verbalized understanding       PT Short Term Goals - 04/27/17 1526      PT SHORT TERM GOAL #1   Title  Patient will be independent with HEP and demonstrate proper form/technique to improve functional strength and flexibility.    Time  3    Period  Weeks    Status  Achieved      PT SHORT TERM GOAL #2   Title  Patient will improve left knee flexion by 10 degrees to demonstrate functional change in ROM for improved gait and stair mobility.     Baseline  04/27/17 - ROM 0-110 (was 0-80)    Time  3    Period  Weeks    Status  Achieved      PT SHORT  TERM GOAL #3   Title  Patient with improve MMT by 1/2 grade for limited muscle groups and be able to tolerate prone position for hip extensor testing demonstrating decreased sensitivity of left incision.     Baseline  04/27/17 - all improve by 1/2 grade or is already 5/5    Time  3    Period  Weeks    Status  Achieved        PT Long Term Goals - 04/27/17 1526      PT LONG TERM GOAL #1   Title  Patient with improve MMT by 1 grade for limited muscle groups to improve functional gait and stair mobility for improved activity tolerance at work.    Time  6    Period  Weeks    Status  On-going      PT LONG TERM GOAL #2   Title  Patient will improve FOTO score to 44% limitation or less to demonstrate a significant improvement in function and decreased limitations at work and during daily activities.    Baseline  04/27/17 - 56% limited (was 70% limited)    Time  6    Period  Weeks    Status  Revised achieved      PT LONG TERM GOAL #3   Title  Patient will improve left knee flexion to 0-120 degrees to demonstrate functional change in ROM for improved gait and stair mobility and to be able to return to gardening this spring/summer.    Baseline  04/27/17 - ROM 0-110 (was 0-80)    Time  6    Period  Weeks    Status  Revised      PT LONG TERM GOAL #4   Title  Patient will perform 5 reps for step down test with 6" step and 1 HHA for balance to demonstrate improved eccentric control and strength with Lt LE.    Time  3    Status  New    Target Date  05/18/17       Plan - 05/11/17 1616    Clinical Impression Statement  Patient presented with worsening edema around left knee both proximally and distally on the medial aspect. She also reported increased sensitivity to scar and pain along the superior boarder of her patella/quad tendon with activity. She was provided the signed referral from her MD  to purchase a compression garment to address the swelling and the session focused on manual therapy to  treat edema, patella mobility, and scar sensitivity. She has remained compliant with HEP exercises and was instructed to continue with her self patella mobilization and ice when she returned home today. There is a raised mass on her proximal tibia on the left LE and it should be monitored for increase in size. She has a follow up with her MD next week and was instructed to discuss the swelling, pain, and bump with him. She will continue to benefit from skilled PT services to address the above impairments, progress towards goals, decrease pain, and improve overall QOL and mobility to return to PLOF.    Rehab Potential  Good    PT Frequency  3x / week    PT Duration  6 weeks    PT Treatment/Interventions  ADLs/Self Care Home Management;Gait training;Stair training;Functional mobility training;Moist Heat;Electrical Stimulation;Cryotherapy;Therapeutic activities;Therapeutic exercise;Balance training;Neuromuscular re-education;Patient/family education;Manual techniques;Scar mobilization;Passive range of motion;Energy conservation;Taping    PT Next Visit Plan  Continue with manual retro massage for edema and pain control.  Perform patella mobilization for flexion and manual therapy to quad tendon and scar massage to reduce sensitivity. Progress ROM for flexion and continues hip and knee strengtheing.  Progress step downs for eccentric control and lateral lunges for control.  Biike for mobility.      PT Home Exercise Plan  Eval: self knee flexion stretch; 04/12/17 - scar desensitization; 04/13/17 - contract relax quad stretch in prone, SAQ; 3/8: inferior patella glides    Consulted and Agree with Plan of Care  Patient       Patient will benefit from skilled therapeutic intervention in order to improve the following deficits and impairments:  Abnormal gait, Decreased skin integrity, Increased fascial restricitons, Pain, Impaired sensation, Improper body mechanics, Decreased mobility, Decreased scar mobility,  Increased muscle spasms, Postural dysfunction, Hypomobility, Decreased strength, Decreased range of motion, Decreased activity tolerance, Decreased balance, Difficulty walking, Increased edema, Impaired flexibility  Visit Diagnosis: Stiffness of left knee, not elsewhere classified  Other abnormalities of gait and mobility  Other symptoms and signs involving the musculoskeletal system  Localized edema     Problem List Patient Active Problem List   Diagnosis Date Noted  . Tobacco abuse   . TOBACCO ABUSE 04/22/2010  . DIZZINESS 04/21/2010  . FATIGUE 04/21/2010  . NIGHT SWEATS 04/21/2010  . HEADACHE 04/21/2010  . PALPITATIONS 04/21/2010  . SHORTNESS OF BREATH 04/21/2010  . DIABETES MELLITUS, BORDERLINE 04/21/2010    Kipp Brood, PT, DPT Physical Therapist with [facility]  05/11/2017 4:22 PM    [place], [place], [address] Phone: [phone]   Fax:  [phone]  Name: Gwendolyn [name] MRN: [id number] Date of Birth: 1965-05-23

## 2017-05-13 ENCOUNTER — Ambulatory Visit (HOSPITAL_COMMUNITY): Payer: BLUE CROSS/BLUE SHIELD

## 2017-05-13 ENCOUNTER — Encounter (HOSPITAL_COMMUNITY): Payer: Self-pay

## 2017-05-13 DIAGNOSIS — M25662 Stiffness of left knee, not elsewhere classified: Secondary | ICD-10-CM

## 2017-05-13 DIAGNOSIS — R29898 Other symptoms and signs involving the musculoskeletal system: Secondary | ICD-10-CM

## 2017-05-13 DIAGNOSIS — R2689 Other abnormalities of gait and mobility: Secondary | ICD-10-CM

## 2017-05-13 DIAGNOSIS — R600 Localized edema: Secondary | ICD-10-CM

## 2017-05-13 NOTE — Therapy (Signed)
[facility], [place], [address] Phone: [phone]   Fax:  [phone]  Physical Therapy Treatment  Patient Details  Name: Gwendolyn [name] MRN: [id number] Date of Birth: 07-Aug-1965 Referring Provider: Magda Bernheim MD   Encounter Date: 05/13/2017  PT End of Session - 05/13/17 1515    Visit Number  14    Number of Visits  17    Date for PT Re-Evaluation  05/19/17    Authorization Type  [employer] [place]    Authorization Time Period  04/07/17 - 05/19/17    PT Start Time  1516    PT Stop Time  1601    PT Time Calculation (min)  45 min    Activity Tolerance  Patient tolerated treatment well;No increased pain    Behavior During Therapy  WFL for tasks assessed/performed       Past Medical History:  Diagnosis Date  . Diabetes mellitus without complication ([place])   . History of TIAs   . Palpitations   . Polycystic ovarian disease   . Tobacco abuse     Past Surgical History:  Procedure Laterality Date  . TUBAL LIGATION      There were no vitals filed for this visit.  Subjective Assessment - 05/13/17 1519    Subjective  Pt states that her L knee/thigh is still pretty swollen. She has her compression garment on. Rates her pain as about 4/10 currently.    Limitations  Sitting;Lifting;Standing;Walking;House hold activities    Patient Stated Goals  want to have 100% recovery and get back to normal    Currently in Pain?  Yes    Pain Score  4     Pain Location  Knee    Pain Orientation  Left;Anterior    Pain Descriptors / Indicators  Aching;Constant;Tightness    Pain Type  Surgical pain    Pain Onset  More than a month ago    Pain Frequency  Constant    Aggravating Factors   stertching, walking    Pain Relieving Factors  rest, laying down    Effect of Pain on Daily Activities  limits her at work and from leisure activities for enjoyment          [facility] Adult PT Treatment/Exercise - 05/13/17 0001       Knee/Hip Exercises: Stretches   Knee: Self-Stretch to increase Flexion  Left;10 seconds;Limitations    Knee: Self-Stretch Limitations  10 reps 12" step    Gastroc Stretch  Both;3 reps;30 seconds;Limitations    Gastroc Stretch Limitations  slant board      Knee/Hip Exercises: Aerobic   Stationary Bike  4' seat 8 for mobility      Knee/Hip Exercises: Standing   Side Lunges  Both;10 reps;Limitations    Side Lunges Limitations  on BOSU (dome up    Hip ADduction Limitations  --    Hip Abduction  Both;2 sets;10 reps    Abduction Limitations  RTB    Step Down  Left;15 reps;Step Height: 6";Limitations    Step Down Limitations  decreased control noted      Manual Therapy   Manual Therapy  Edema management;Joint mobilization    Manual therapy comments  Performed seperate of other skilled interventions    Edema Management  retrograde massage to left knee joint with ankle pumps    Joint Mobilization  3x 30-45 seconds in all planes for patellofemoral mobilization in open pack position for left knee  PT Short Term Goals - 04/27/17 1526      PT SHORT TERM GOAL #1   Title  Patient will be independent with HEP and demonstrate proper form/technique to improve functional strength and flexibility.    Time  3    Period  Weeks    Status  Achieved      PT SHORT TERM GOAL #2   Title  Patient will improve left knee flexion by 10 degrees to demonstrate functional change in ROM for improved gait and stair mobility.     Baseline  04/27/17 - ROM 0-110 (was 0-80)    Time  3    Period  Weeks    Status  Achieved      PT SHORT TERM GOAL #3   Title  Patient with improve MMT by 1/2 grade for limited muscle groups and be able to tolerate prone position for hip extensor testing demonstrating decreased sensitivity of left incision.     Baseline  04/27/17 - all improve by 1/2 grade or is already 5/5    Time  3    Period  Weeks    Status  Achieved        PT Long Term Goals - 04/27/17 1526       PT LONG TERM GOAL #1   Title  Patient with improve MMT by 1 grade for limited muscle groups to improve functional gait and stair mobility for improved activity tolerance at work.    Time  6    Period  Weeks    Status  On-going      PT LONG TERM GOAL #2   Title  Patient will improve FOTO score to 44% limitation or less to demonstrate a significant improvement in function and decreased limitations at work and during daily activities.    Baseline  04/27/17 - 56% limited (was 70% limited)    Time  6    Period  Weeks    Status  Revised achieved      PT LONG TERM GOAL #3   Title  Patient will improve left knee flexion to 0-120 degrees to demonstrate functional change in ROM for improved gait and stair mobility and to be able to return to gardening this spring/summer.    Baseline  04/27/17 - ROM 0-110 (was 0-80)    Time  6    Period  Weeks    Status  Revised      PT LONG TERM GOAL #4   Title  Patient will perform 5 reps for step down test with 6" step and 1 HHA for balance to demonstrate improved eccentric control and strength with Lt LE.    Time  3    Status  New    Target Date  05/18/17            Plan - 05/13/17 1611    Clinical Impression Statement  Pt presented to therapy with her compression garment donned this date. She reported that she received it Wednesday after her PT session and wore it yesterday and today. Pt tolerated standing mobility exercises this date. Added hip abd with RTB and side lunges on BOSU for further hip and functional strengthening. Pt with noted fatigue of L hip musculature. Resumed step downs focusing on control this date. She was noted to have increased hip IR and knee valgus during, indicating L hip weakness. Pt's AROM 113deg pre-manual therapy and was 115deg post-manual therapy.    Rehab Potential  Good    PT Frequency  3x / week    PT Duration  6 weeks    PT Treatment/Interventions  ADLs/Self Care Home Management;Gait training;Stair  training;Functional mobility training;Moist Heat;Electrical Stimulation;Cryotherapy;Therapeutic activities;Therapeutic exercise;Balance training;Neuromuscular re-education;Patient/family education;Manual techniques;Scar mobilization;Passive range of motion;Energy conservation;Taping    PT Next Visit Plan  Continue with manual retro massage for edema and pain control.  Perform patella mobilization for flexion and manual therapy to quad tendon and scar massage to reduce sensitivity. Progress ROM for flexion and continues hip and knee strengtheing; continue side lunges on BOSU and hip abd; add sidestepping with RTB; Progress step downs for eccentric control and lateral lunges for control. Bike for mobility.      PT Home Exercise Plan  Eval: self knee flexion stretch; 04/12/17 - scar desensitization; 04/13/17 - contract relax quad stretch in prone, SAQ; 3/8: inferior patella glides    Consulted and Agree with Plan of Care  Patient       Patient will benefit from skilled therapeutic intervention in order to improve the following deficits and impairments:  Abnormal gait, Decreased skin integrity, Increased fascial restricitons, Pain, Impaired sensation, Improper body mechanics, Decreased mobility, Decreased scar mobility, Increased muscle spasms, Postural dysfunction, Hypomobility, Decreased strength, Decreased range of motion, Decreased activity tolerance, Decreased balance, Difficulty walking, Increased edema, Impaired flexibility  Visit Diagnosis: Stiffness of left knee, not elsewhere classified  Other abnormalities of gait and mobility  Other symptoms and signs involving the musculoskeletal system  Localized edema     Problem List Patient Active Problem List   Diagnosis Date Noted  . Tobacco abuse   . TOBACCO ABUSE 04/22/2010  . DIZZINESS 04/21/2010  . FATIGUE 04/21/2010  . NIGHT SWEATS 04/21/2010  . HEADACHE 04/21/2010  . PALPITATIONS 04/21/2010  . SHORTNESS OF BREATH 04/21/2010  .  DIABETES MELLITUS, BORDERLINE 04/21/2010       Geraldine Solar PT, DPT  [facility] [address] [place], [place], [address] Phone: [phone]   Fax:  [phone]  Name: Gwendolyn [name] MRN: [id number] Date of Birth: 16-Sep-1965

## 2017-05-16 ENCOUNTER — Encounter (HOSPITAL_COMMUNITY): Payer: Self-pay

## 2017-05-16 ENCOUNTER — Ambulatory Visit (HOSPITAL_COMMUNITY): Payer: BLUE CROSS/BLUE SHIELD | Attending: Orthopedic Surgery

## 2017-05-16 ENCOUNTER — Other Ambulatory Visit: Payer: Self-pay

## 2017-05-16 DIAGNOSIS — R600 Localized edema: Secondary | ICD-10-CM

## 2017-05-16 DIAGNOSIS — R29898 Other symptoms and signs involving the musculoskeletal system: Secondary | ICD-10-CM | POA: Diagnosis present

## 2017-05-16 DIAGNOSIS — M25662 Stiffness of left knee, not elsewhere classified: Secondary | ICD-10-CM

## 2017-05-16 DIAGNOSIS — R2689 Other abnormalities of gait and mobility: Secondary | ICD-10-CM | POA: Diagnosis present

## 2017-05-16 NOTE — Therapy (Signed)
[place], [place], [address] Phone: [phone]   Fax:  [phone]  Physical Therapy Treatment  Patient Details  Name: Gwendolyn [name] MRN: [id number] Date of Birth: Dec 08, 1965 Referring Provider: Magda Bernheim MD   Encounter Date: 05/16/2017  PT End of Session - 05/16/17 1523    Visit Number  15    Number of Visits  17    Date for PT Re-Evaluation  05/19/17    Authorization Type  [employer] [place] (30 visit limit annually)    Authorization Time Period  04/07/17 - 05/19/17    PT Start Time  1523    PT Stop Time  1606    PT Time Calculation (min)  43 min    Activity Tolerance  Patient tolerated treatment well;No increased pain    Behavior During Therapy  WFL for tasks assessed/performed       Past Medical History:  Diagnosis Date  . Diabetes mellitus without complication ([place])   . History of TIAs   . Palpitations   . Polycystic ovarian disease   . Tobacco abuse     Past Surgical History:  Procedure Laterality Date  . TUBAL LIGATION      There were no vitals filed for this visit.  Subjective Assessment - 05/16/17 1522    Subjective  Patient reports her knee is feeling a little bit better than it had been last session and states she has been wearing her compression garment regularly. She has her follow-up with her MD tomorrow but was unsure about going. I encouraged her to go to the appointment.     Limitations  Sitting;Lifting;Standing;Walking;House hold activities    Patient Stated Goals  want to have 100% recovery and get back to normal    Currently in Pain?  Yes    Pain Score  5     Pain Location  Knee    Pain Orientation  Left;Anterior    Pain Descriptors / Indicators  Aching;Tightness    Pain Type  Chronic pain;Surgical pain    Pain Onset  More than a month ago    Pain Frequency  Constant    Aggravating Factors   walking, stretching/bending her knee    Pain Relieving Factors  rest, laying  down, compression    Effect of Pain on Daily Activities  limited at work, and with hobbies/enjoyable activities         [facility] PT Assessment - 05/16/17 0001      Assessment   Next MD Visit  05/17/17      Observation/Other Assessments-Edema    Edema  Circumferential      Circumferential Edema   Circumferential - Left   17.5 inches at joint line was 16.75 at evaluation      AROM   Right Knee Extension  --    Left Knee Extension  0    Left Knee Flexion  113        [facility] Adult PT Treatment/Exercise - 05/16/17 0001      Knee/Hip Exercises: Stretches   Knee: Self-Stretch to increase Flexion  Left;Limitations;3 reps;30 seconds    Knee: Self-Stretch Limitations  2nd stair (12+ inches)    Gastroc Stretch  Both;3 reps;30 seconds;Limitations    Gastroc Stretch Limitations  slant board      Knee/Hip Exercises: Aerobic   Stationary Bike  4' seat 7 for mobility, level 2      Knee/Hip Exercises: Standing   Heel Raises  20  reps;Both    Side Lunges  10 reps;Limitations;2 sets;Left left stepping into BOSU    Side Lunges Limitations  on BOSU (dome up)    Hip Abduction  Both;1 set;15 reps    Abduction Limitations  RTB    Step Down  Left;Step Height: 6";Limitations;10 reps;2 sets;Hand Hold: 2    Step Down Limitations  eccentric lowering with use of Bil UE to step back up    SLS with Vectors  10 x 15 seconds on LT LE, on foam,     Other Standing Knee Exercises  Side lunge with towel under Rt foot, for eccentric lowering of LT LE and concentric extension of Lt knee; 2 x 10 reps      Manual Therapy   Manual Therapy  Edema management;Joint mobilization    Manual therapy comments  Performed seperate of other skilled interventions    Edema Management  retrograde massage to left knee joint with ankle pumps    Joint Mobilization  3x 30-45 seconds in all planes for patellofemoral mobilization in open pack position for left knee         PT Education - 05/16/17 1551    Education provided  Yes     Education Details  Educated on exercise throughout and discussed visit limit. Educated on swelling management with heel raises, compression garment.    Person(s) Educated  Patient    Methods  Explanation    Comprehension  Verbalized understanding       PT Short Term Goals - 04/27/17 1526      PT SHORT TERM GOAL #1   Title  Patient will be independent with HEP and demonstrate proper form/technique to improve functional strength and flexibility.    Time  3    Period  Weeks    Status  Achieved      PT SHORT TERM GOAL #2   Title  Patient will improve left knee flexion by 10 degrees to demonstrate functional change in ROM for improved gait and stair mobility.     Baseline  04/27/17 - ROM 0-110 (was 0-80)    Time  3    Period  Weeks    Status  Achieved      PT SHORT TERM GOAL #3   Title  Patient with improve MMT by 1/2 grade for limited muscle groups and be able to tolerate prone position for hip extensor testing demonstrating decreased sensitivity of left incision.     Baseline  04/27/17 - all improve by 1/2 grade or is already 5/5    Time  3    Period  Weeks    Status  Achieved        PT Long Term Goals - 04/27/17 1526      PT LONG TERM GOAL #1   Title  Patient with improve MMT by 1 grade for limited muscle groups to improve functional gait and stair mobility for improved activity tolerance at work.    Time  6    Period  Weeks    Status  On-going      PT LONG TERM GOAL #2   Title  Patient will improve FOTO score to 44% limitation or less to demonstrate a significant improvement in function and decreased limitations at work and during daily activities.    Baseline  04/27/17 - 56% limited (was 70% limited)    Time  6    Period  Weeks    Status  Revised achieved      PT LONG  TERM GOAL #3   Title  Patient will improve left knee flexion to 0-120 degrees to demonstrate functional change in ROM for improved gait and stair mobility and to be able to return to gardening this  spring/summer.    Baseline  04/27/17 - ROM 0-110 (was 0-80)    Time  6    Period  Weeks    Status  Revised      PT LONG TERM GOAL #4   Title  Patient will perform 5 reps for step down test with 6" step and 1 HHA for balance to demonstrate improved eccentric control and strength with Lt LE.    Time  3    Status  New    Target Date  05/18/17         Plan - 05/16/17 1523    Clinical Impression Statement  Patient arrived to session reporting decreased pain from prior session and stating she believes her edema is getting slightly better. She has her follow up appointment with her MD tomorrow and was educated to discuss ongoing edema with her doctor and the "bump" on her proximal tibia of the left leg. Session focused on eccentric strengthening of her Lt quad with lunges and step downs. Her ROM remains 0-113 degrees and edema is currently increased from 16.75 inches at initial evaluation to 17.5 inches this date. She will continue to benefit from skilled PT services to address ongoing limitations and progress towards goals.     Rehab Potential  Good    PT Frequency  3x / week    PT Duration  6 weeks    PT Treatment/Interventions  ADLs/Self Care Home Management;Gait training;Stair training;Functional mobility training;Moist Heat;Electrical Stimulation;Cryotherapy;Therapeutic activities;Therapeutic exercise;Balance training;Neuromuscular re-education;Patient/family education;Manual techniques;Scar mobilization;Passive range of motion;Energy conservation;Taping    PT Next Visit Plan  Re-assess next session, discuss insurance visit limit and ask for new auth if needed. Continue with manual retro massage for edema and pain control.  Perform patella mobilization for flexion and manual therapy to quad tendon and scar massage to reduce sensitivity. Progress ROM for flexion and continues hip and knee strengtheing; continue side lunges on BOSU and hip abd; Progress step downs for eccentric control and lateral  lunges for control. Bike for mobility.      PT Home Exercise Plan  Eval: self knee flexion stretch; 04/12/17 - scar desensitization; 04/13/17 - contract relax quad stretch in prone, SAQ; 3/8: inferior patella glides    Consulted and Agree with Plan of Care  Patient       Patient will benefit from skilled therapeutic intervention in order to improve the following deficits and impairments:  Abnormal gait, Decreased skin integrity, Increased fascial restricitons, Pain, Impaired sensation, Improper body mechanics, Decreased mobility, Decreased scar mobility, Increased muscle spasms, Postural dysfunction, Hypomobility, Decreased strength, Decreased range of motion, Decreased activity tolerance, Decreased balance, Difficulty walking, Increased edema, Impaired flexibility  Visit Diagnosis: Stiffness of left knee, not elsewhere classified  Other abnormalities of gait and mobility  Other symptoms and signs involving the musculoskeletal system  Localized edema     Problem List Patient Active Problem List   Diagnosis Date Noted  . Tobacco abuse   . TOBACCO ABUSE 04/22/2010  . DIZZINESS 04/21/2010  . FATIGUE 04/21/2010  . NIGHT SWEATS 04/21/2010  . HEADACHE 04/21/2010  . PALPITATIONS 04/21/2010  . SHORTNESS OF BREATH 04/21/2010  . DIABETES MELLITUS, BORDERLINE 04/21/2010    Kipp Brood, PT, DPT Physical Therapist with [facility]  05/16/2017 4:19 PM  [place] [address] [place], [place], [address] Phone: [phone]   Fax:  [phone]  Name: Gwendolyn [name] MRN: [id number] Date of Birth: 06-02-1965

## 2017-05-17 DIAGNOSIS — M898X6 Other specified disorders of bone, lower leg: Secondary | ICD-10-CM | POA: Insufficient documentation

## 2017-05-18 ENCOUNTER — Other Ambulatory Visit: Payer: Self-pay

## 2017-05-18 ENCOUNTER — Ambulatory Visit (HOSPITAL_COMMUNITY): Payer: BLUE CROSS/BLUE SHIELD

## 2017-05-18 ENCOUNTER — Encounter (HOSPITAL_COMMUNITY): Payer: Self-pay

## 2017-05-18 DIAGNOSIS — R600 Localized edema: Secondary | ICD-10-CM

## 2017-05-18 DIAGNOSIS — M25662 Stiffness of left knee, not elsewhere classified: Secondary | ICD-10-CM

## 2017-05-18 DIAGNOSIS — R29898 Other symptoms and signs involving the musculoskeletal system: Secondary | ICD-10-CM

## 2017-05-18 DIAGNOSIS — R2689 Other abnormalities of gait and mobility: Secondary | ICD-10-CM

## 2017-05-18 NOTE — Therapy (Signed)
[place], [place], [address] Phone: [phone]   Fax:  [phone]  Physical Therapy Treatment/Discharge Summary  Patient Details  Name: Gwendolyn [name] MRN: [id number] Date of Birth: Sep 15, 1965 Referring Provider: Magda Bernheim MD   Encounter Date: 05/18/2017   PHYSICAL THERAPY DISCHARGE SUMMARY  Visits from Start of Care: 16  Current functional level related to goals / functional outcomes: Re-assessment performed today and patient has met 3/3 short term goals and partially met 3/4 long term goals. Two of her long-term goals were previously met and revised at earlier re-assessment. She has made a significant improvement in ROM from 0-80 degrees at evaluation to 0-115 degrees AROM of Lt knee; however limited improvement since last re-assessment gaining only 5 more degrees from her re-assessment on 04/27/17 for knee flexion. She continues to experience ongoing knee pain and edema that has increased since evaluation. Gwendolyn [name] had her follow up appointment yesterday and her MD is planning to perform and MRI/X-ray to assess the left knee in June to be sure it is healing well and no new tumors have developed. She will be discharged at this time and has been educated on HEP and proper care for compression garment to address swelling. She may benefit from continued skilled PT services to address ongoing ROM and mobility deficits after follow up with her MD.   Remaining deficits: Lt Knee ROM = 0-115 Rt Knee ROM = 0-140   Education / Equipment: Educated on discharging/holding physical therapy based on insurance visit limit and pending testing for left knee in June. Provided additional exercises for HEP to reduce swelling and focus on left quad strenght. Educated patient on accomadations she can make to garden this spring/summer such as using a stool/bench to sit on.   Plan: Patient agrees to discharge.  Patient goals were partially  met. Patient is being discharged due to meeting the stated rehab goals.  ?????       PT End of Session - 05/18/17 1526    Visit Number  16    Number of Visits  17    Date for PT Re-Evaluation  05/19/17    Authorization Type  [employer] [place] (30 visit limit annually)    Authorization Time Period  04/07/17 - 05/19/17    PT Start Time  1520    PT Stop Time  1601    PT Time Calculation (min)  41 min    Activity Tolerance  Patient tolerated treatment well;No increased pain    Behavior During Therapy  WFL for tasks assessed/performed       Past Medical History:  Diagnosis Date  . Diabetes mellitus without complication ([place])   . History of TIAs   . Palpitations   . Polycystic ovarian disease   . Tobacco abuse     Past Surgical History:  Procedure Laterality Date  . TUBAL LIGATION      There were no vitals filed for this visit.  Subjective Assessment - 05/18/17 1520    Subjective  Patient reports she had her follow-up visit with her MD yesterday and that they would like her to come back June 11th for her 6 month visit and they will perform and x-ray and MRI for upper thigh and lower leg both to make sure her surgical site is still healing properly and ensure no new tumors have developed. She reports the MD told her that the sensation around her incision would not come back due to the  extent of her surgery. She states she feels Pt has helped her a lot and it is much easier to do her daily tasks however she stills feels limited.     Limitations  Sitting;Lifting;Standing;Walking;House hold activities    Patient Stated Goals  want to have 100% recovery and get back to normal    Currently in Pain?  Yes    Pain Score  4     Pain Location  Knee    Pain Orientation  Left;Anterior    Pain Descriptors / Indicators  Aching;Tightness    Pain Type  Chronic pain;Surgical pain    Pain Onset  More than a month ago    Pain Frequency  Constant         [facility] PT Assessment - 05/18/17 0001       Assessment   Medical Diagnosis  Decreased strength and flexibility of LE    Referring Provider  Wilson, Scott MD    Onset Date/Surgical Date  02/18/17    Next MD Visit  07/26/17  6 month follow-up      Prior Function   Level of Independence  Independent      Observation/Other Assessments   Focus on Therapeutic Outcomes (FOTO)   49% limited was 70% limited on 04/07/17      Circumferential Edema   Circumferential - Left   17.5 inches at joint line was 16.75 at evaluation      Functional Tests   Functional tests  Step down      Step Down   Comments  5x with finger tip support; pain on Lt knee, controlled lowering but unable to push up with left LE      AROM   Left Knee Extension  0 0 on 04/07/17    Left Knee Flexion  115 80 on 04/07/17      Strength   Right Hip Flexion  4+/5 4-/5 on 04/07/17    Right Hip Extension  4+/5 4/5 on 04/27/17, unable to test at eval    Right Hip ABduction  5/5 was 5/5 on 04/07/17    Left Hip Flexion  4/5 was 4-/5 on 04/07/17    Left Hip Extension  4+/5 4/5 on 04/27/17, unable to test at eval    Left Hip ABduction  5/5 4/5 on 04/07/17    Right Knee Flexion  5/5 was 5/5 on 04/07/17    Right Knee Extension  5/5 was 5/5 on 04/07/17    Left Knee Flexion  5/5 was 5/5 on 04/07/17    Left Knee Extension  5/5 was 5/5 on 04/07/17    Right Ankle Dorsiflexion  4+/5 was 4+/5 on 04/07/17    Right Ankle Plantar Flexion  5/5 was 4-/5 on 04/07/17    Left Ankle Dorsiflexion  5/5 was 4+/5 on 04/07/17    Left Ankle Plantar Flexion  5/5 was 4-/5 on 04/07/17      Ambulation/Gait   Ambulation/Gait  Yes    Ambulation/Gait Assistance  7: Independent    Stairs  Yes    Stairs Assistance  6: Modified independent (Device/Increase time)    Stair Management Technique  One rail Right;Alternating pattern;Forwards    Number of Stairs  4    Height of Stairs  6        [facility] Adult PT Treatment/Exercise - 05/18/17 0001      Transfers   Transfers  Floor to Transfer    Floor to Transfer   6: Modified independent (Device/Increase time);With upper extremity assist        Knee/Hip Exercises: Standing   Heel Raises  Both;2 sets;20 reps;3 seconds    Terminal Knee Extension  Left;2 sets;20 reps;Theraband    Theraband Level (Terminal Knee Extension)  Level 3 (Green)    Step Down  Left;Right;1 set;5 reps;Hand Hold: 2;Step Height: 6"    Step Down Limitations  eccentric lowering with use of Bil UE to step back up         PT Education - 05/18/17 1615    Education provided  Yes    Education Details  Educated on discharging/holding physical therapy based on insurance visit limit and pending testing for left knee in June. Provided additional exercises for HEP to reduce swelling and focus on left quad strenght. Educated patient on accomadations she can make to garden this spring/summer such as using a stool/bench to sit on.     Person(s) Educated  Patient    Methods  Explanation;Handout    Comprehension  Verbalized understanding;Returned demonstration       PT Short Term Goals - 05/18/17 1520      PT SHORT TERM GOAL #1   Title  Patient will be independent with HEP and demonstrate proper form/technique to improve functional strength and flexibility.    Time  3    Period  Weeks    Status  Achieved      PT SHORT TERM GOAL #2   Title  Patient will improve left knee flexion by 10 degrees to demonstrate functional change in ROM for improved gait and stair mobility.     Baseline  04/27/17 - ROM 0-110 (was 0-80)    Time  3    Period  Weeks    Status  Achieved      PT SHORT TERM GOAL #3   Title  Patient with improve MMT by 1/2 grade for limited muscle groups and be able to tolerate prone position for hip extensor testing demonstrating decreased sensitivity of left incision.     Baseline  04/27/17 - all improve by 1/2 grade or is already 5/5    Time  3    Period  Weeks    Status  Achieved        PT Long Term Goals - 05/18/17 1520      PT LONG TERM GOAL #1   Title  Patient with  improve MMT by 1 grade for limited muscle groups to improve functional gait and stair mobility for improved activity tolerance at work.    Baseline  05/18/17 - some groups remain 1/2 grade improve limited by back pain with hip flexion; some groups improved by 1 grade    Time  6    Period  Weeks    Status  Partially Met      PT LONG TERM GOAL #2   Title  Patient will improve FOTO score to 44% limitation or less to demonstrate a significant improvement in function and decreased limitations at work and during daily activities.    Baseline  04/27/17 - 56% limited (was 70% limited); 05/18/17 - 49% limited    Time  6    Period  Weeks    Status  Partially Met achieved      PT LONG TERM GOAL #3   Title  Patient will improve left knee flexion to 0-120 degrees to demonstrate functional change in ROM for improved gait and stair mobility and to be able to return to gardening this spring/summer.    Baseline  04/27/17 - ROM 0-110 (was 0-80); 05/18/17 - 0-115      Time  6    Period  Weeks    Status  On-going      PT LONG TERM GOAL #4   Title  Patient will perform 5 reps for step down test with 6" step and 1 HHA for balance to demonstrate improved eccentric control and strength with Lt LE.    Baseline  05/18/17 - patient can perform however painful and requires 2 HHA    Time  3    Status  Partially Met        Plan - 05/18/17 1552    Clinical Impression Statement  Re-assessment performed today and patient has met 3/3 short term goals and partially met 3/4 long term goals. Two of her long-term goals were previously met and revised at earlier re-assessment. She has made a significant improvement in ROM from 0-80 degrees at evaluation to 0-115 degrees AROM of Lt knee; however limited improvement since last re-assessment gaining only 5 more degrees from her re-assessment on 04/27/17 for knee flexion. She continues to experience ongoing knee pain and edema that has increased since evaluation. Gwendolyn [name] had her follow up  appointment yesterday and her MD is planning to perform and MRI/X-ray to assess the left knee in June to be sure it is healing well and no new tumors have developed. She will be discharged at this time and has been educated on HEP and proper care for compression garment to address swelling. She may benefit from continued skilled PT services to address ongoing ROM and mobility deficits after follow up with her MD.    Rehab Potential  Good    PT Frequency  3x / week    PT Duration  6 weeks    PT Treatment/Interventions  ADLs/Self Care Home Management;Gait training;Stair training;Functional mobility training;Moist Heat;Electrical Stimulation;Cryotherapy;Therapeutic activities;Therapeutic exercise;Balance training;Neuromuscular re-education;Patient/family education;Manual techniques;Scar mobilization;Passive range of motion;Energy conservation;Taping    PT Next Visit Plan  discharging.     PT Home Exercise Plan  Eval: self knee flexion stretch; 04/12/17 - scar desensitization; 04/13/17 - contract relax quad stretch in prone, SAQ; 3/8: inferior patella glides; 05/18/17 - heel raises, TKE standing    Consulted and Agree with Plan of Care  Patient       Patient will benefit from skilled therapeutic intervention in order to improve the following deficits and impairments:  Abnormal gait, Decreased skin integrity, Increased fascial restricitons, Pain, Impaired sensation, Improper body mechanics, Decreased mobility, Decreased scar mobility, Increased muscle spasms, Postural dysfunction, Hypomobility, Decreased strength, Decreased range of motion, Decreased activity tolerance, Decreased balance, Difficulty walking, Increased edema, Impaired flexibility  Visit Diagnosis: Stiffness of left knee, not elsewhere classified  Other abnormalities of gait and mobility  Other symptoms and signs involving the musculoskeletal system  Localized edema     Problem List Patient Active Problem List   Diagnosis Date Noted   . Tobacco abuse   . TOBACCO ABUSE 04/22/2010  . DIZZINESS 04/21/2010  . FATIGUE 04/21/2010  . NIGHT SWEATS 04/21/2010  . HEADACHE 04/21/2010  . PALPITATIONS 04/21/2010  . SHORTNESS OF BREATH 04/21/2010  . DIABETES MELLITUS, BORDERLINE 04/21/2010     Quinn-Brown, PT, DPT Physical Therapist with [facility]  05/18/2017 5:55 PM    [facility] [address] [place], [place], [address] Phone: [phone]   Fax:  [phone]  Name: Kaedence L Clodfelter MRN: [id number] Date of Birth: 11/24/1965   

## 2017-05-18 NOTE — Patient Instructions (Signed)
   STANDING HEEL RAISES: 2-3 time 20 repetitions  While standing, raise up on your toes as you lift your heels off the ground.     TERMINAL KNEE EXTENSION - TKE: 1-2 times 15-20 repetitions, 5 second holds  Start in a standing position with an elastic band attached above your knee and the other end tied with a knot and fixated behind a closed door or other anchor. The target knee should be partially bent with your toes  touching the ground.  Next, move your knee back towards a straightened position so that your heel touches the floor and you pull against the band.

## 2017-07-22 ENCOUNTER — Encounter: Payer: Self-pay | Admitting: Family Medicine

## 2017-08-05 ENCOUNTER — Other Ambulatory Visit: Payer: Self-pay | Admitting: Family Medicine

## 2017-08-05 DIAGNOSIS — E1165 Type 2 diabetes mellitus with hyperglycemia: Secondary | ICD-10-CM

## 2017-08-08 NOTE — Telephone Encounter (Signed)
Needs OV. Gwen Her. Mannie Stabile, MD

## 2017-08-09 NOTE — Telephone Encounter (Signed)
Spoke with patient and advised her she needed an ov for future refills. She stated she will get a new pcp since Dr.Hagler is leaving our practice.

## 2017-12-06 IMAGING — US US SOFT TISSUE HEAD/NECK
1 series · 13 of 25 positions shown · non-contrast
Comparison: 02/04/2015;

CLINICAL DATA: Goiter. History of prior bilateral thyroid nodule
biopsy.

EXAM:
THYROID ULTRASOUND
TECHNIQUE: Ultrasound examination of the thyroid gland and adjacent soft
tissues was performed.

[Series 1: us soft tissue head/neck · 0.08mm/px · 13 of 60 slices shown]
[im 1/60]
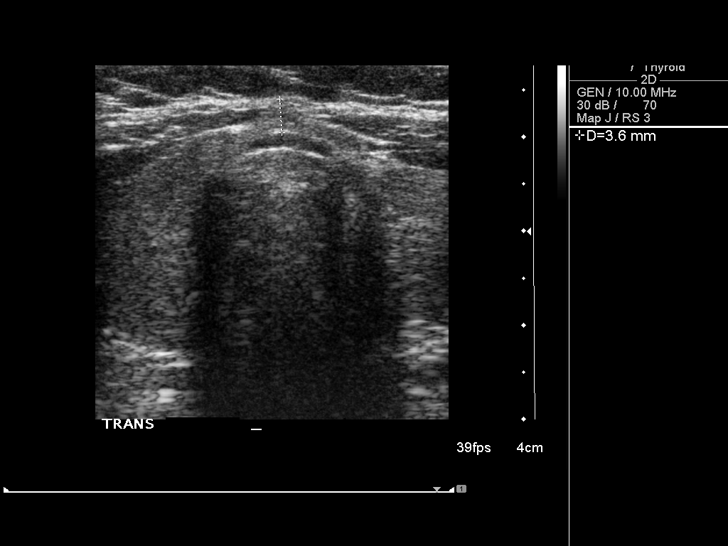
[im 5/60]
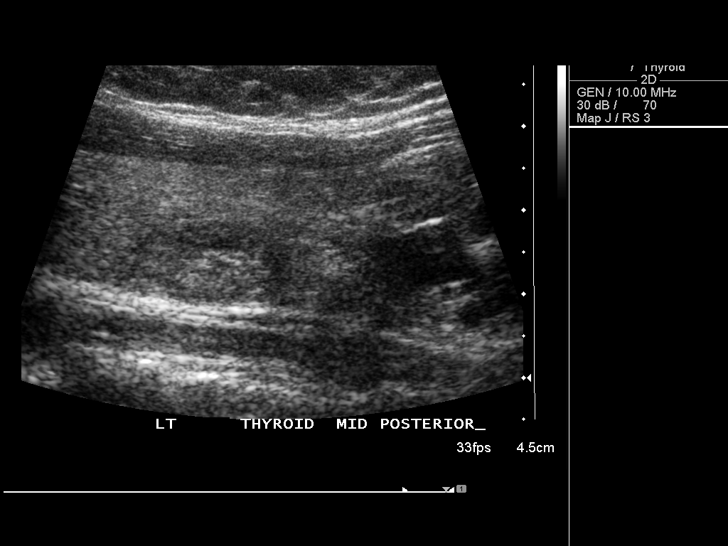
[im 10/60]
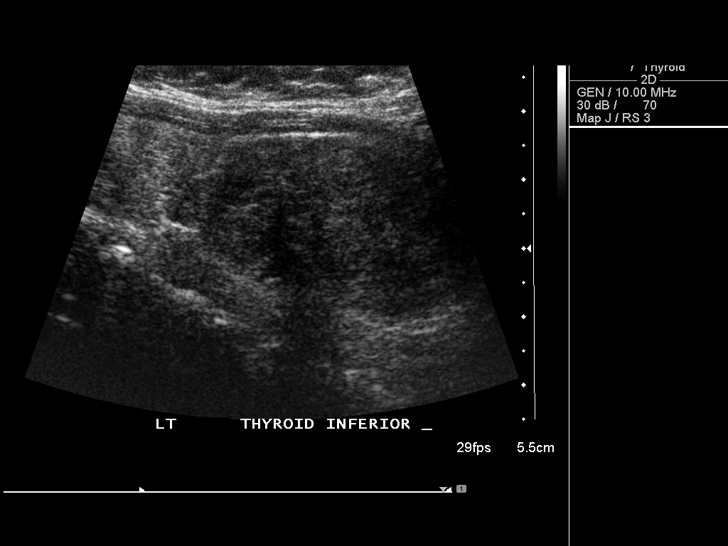
[im 15/60]
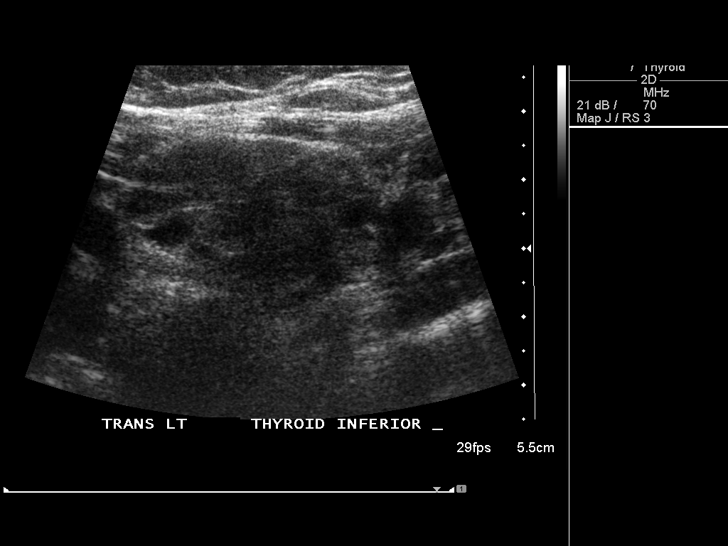
[im 20/60]
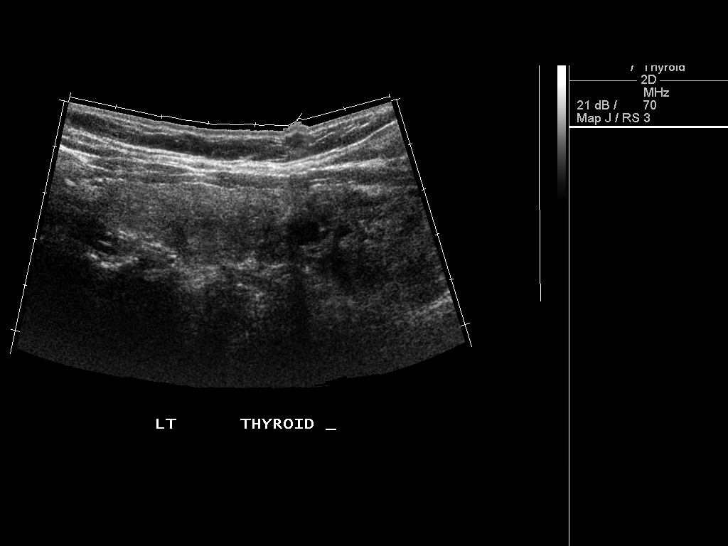
[im 25/60]
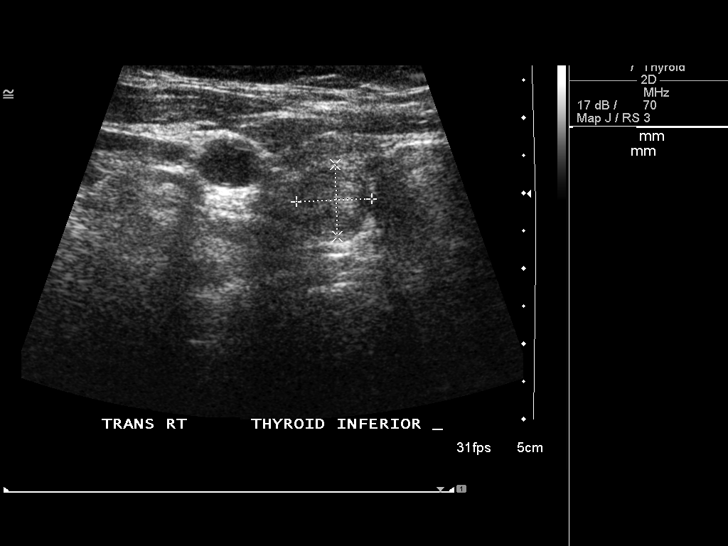
[im 30/60]
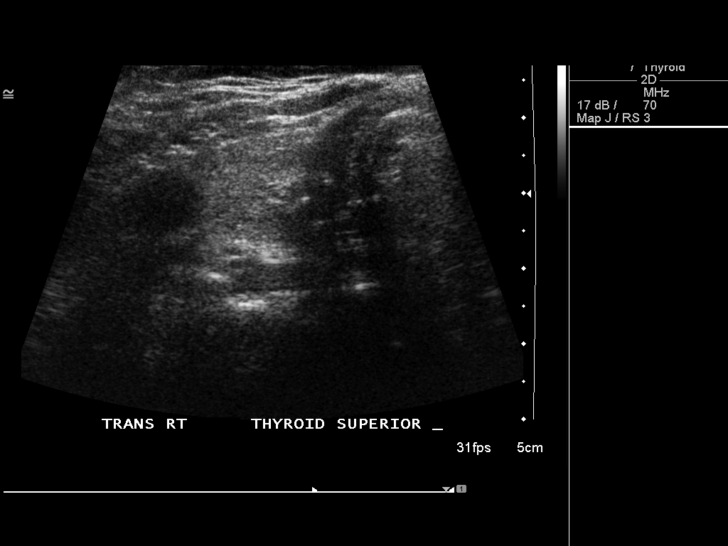
[im 35/60]
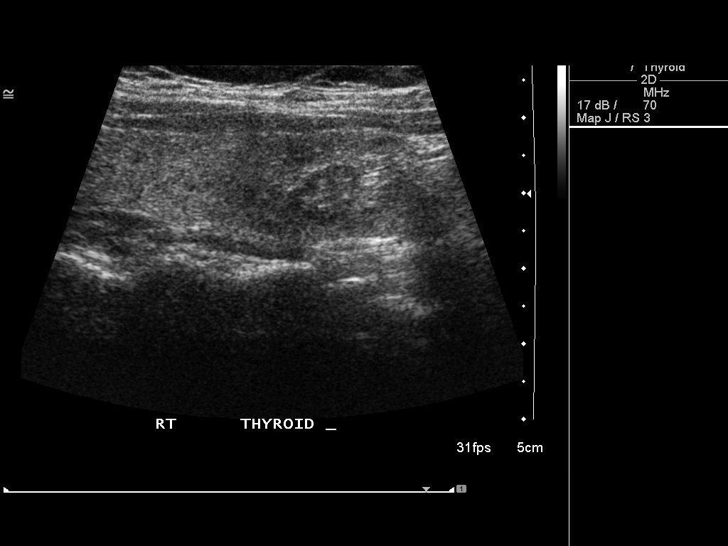
[im 40/60]
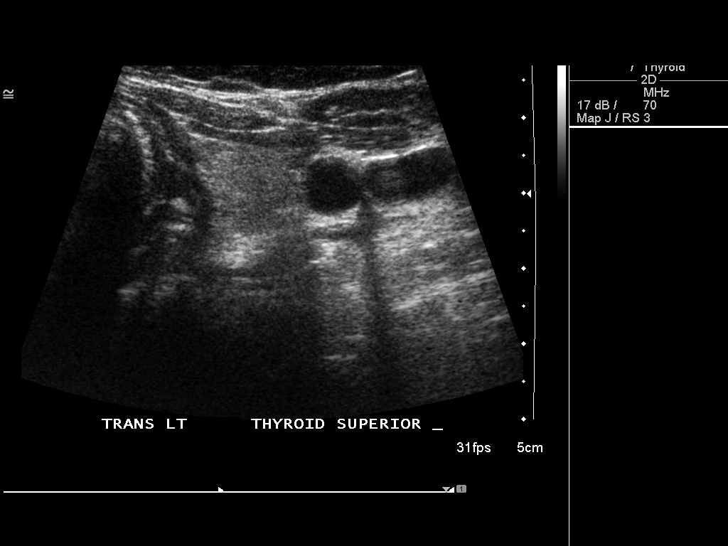
[im 45/60]
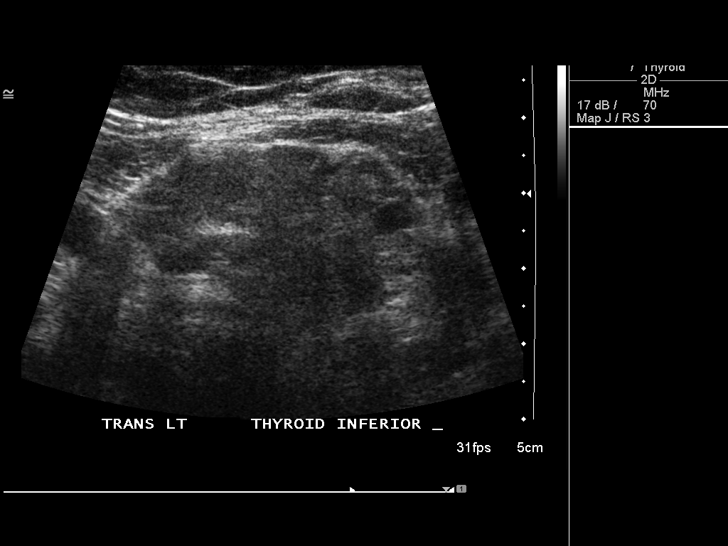
[im 50/60]
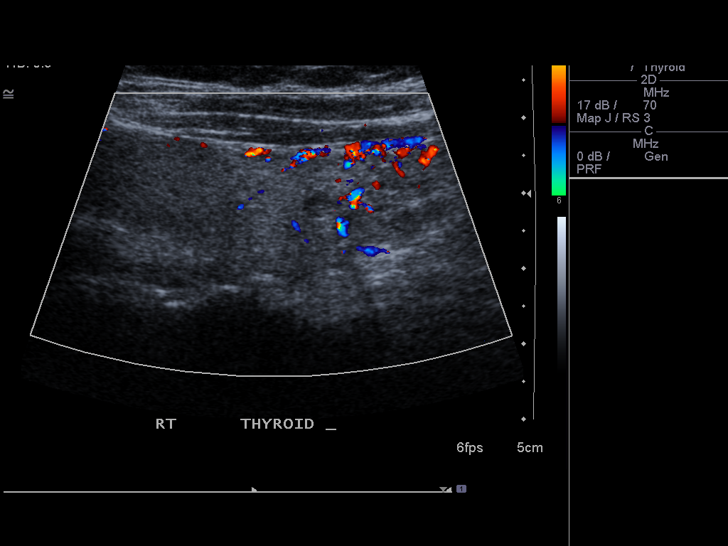
[im 55/60]
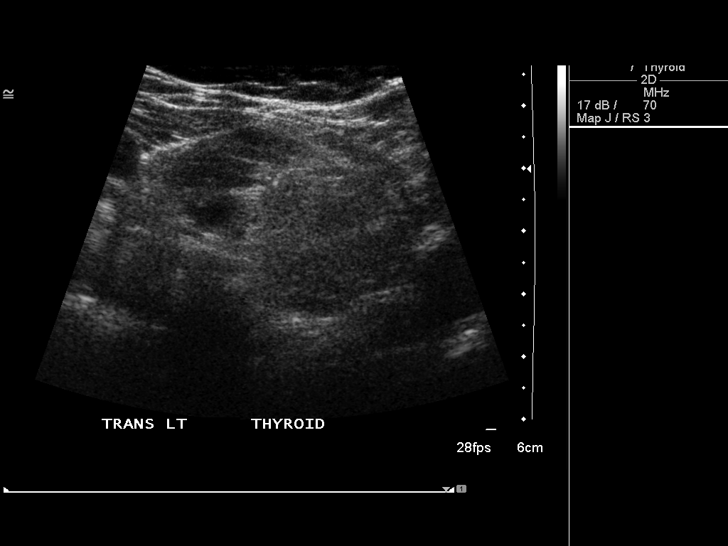
[im 60/60]
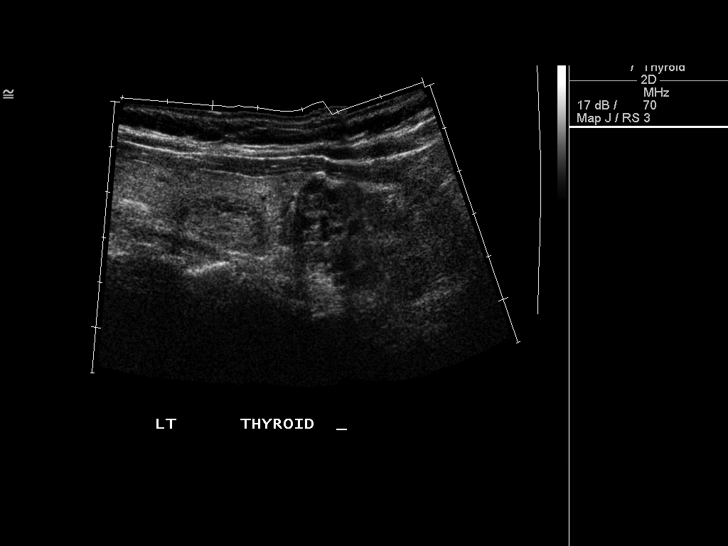

[13 of 25 positions shown; findings below may reference images not displayed]

05/31/2014; ultrasound-guided bilateral
thyroid nodule fine-needle aspiration -06/06/2014
FINDINGS: Parenchymal Echotexture: Mildly heterogenous

Estimated total number of nodules >/= 1 cm: 3

Number of spongiform nodules >/=  2 cm not described below (TR1): 0

Number of mixed cystic and solid nodules >/= 1.5 cm not described
below (TR2): 0

_________________________________________________________

Isthmus: Normal in size measures 0.4 cm in diameter, unchanged

No discrete nodules are identified within the thyroid isthmus.

_________________________________________________________

Right lobe: Normal in size measuring 5.4 x 1.8 x 2.0 cm, unchanged,
previously, 5.9 x 2.0 x 1.5 cm

The previously biopsied ill-defined approximately 1.7 x 1.0 x 1.0 cm
nodule/pseudo nodule within the inferior aspect the right lobe of
previously, 1.6 x 0.8 x 1.0 cm. Correlation prior biopsy results is
recommended.

_________________________________________________________

Left lobe: Enlarged measuring 8.5 x 1.7 x 2.0 cm, unchanged,
previously, 8.5 x 2.8 x 1.9 cm

Nodule # 1:

Prior biopsy: No

Location: Left; Mid

Size: 2.0 x 1.4 x 1.0 cm, grossly unchanged compared to the [DATE]
examination, previously, 1.7 x 1.4 x 0.9 cm with slight differences
likely total to scan plane projection

Composition: solid/almost completely solid (2)

Echogenicity: isoechoic (1)

Shape: not taller-than-wide (0)

Margins: ill-defined (0)

Echogenic foci: none (0)

ACR TI-RADS total points: 3.

ACR TI-RADS risk category: TR3 (3 points).

Change in features: No

Change in ACR TI-RADS risk category: No

ACR TI-RADS recommendations:

*Given size (>/= 1.5 - 2.4 cm) and appearance, a follow-up
ultrasound in 1 year should be considered based on TI-RADS criteria.

The previously biopsied approximately 4.1 x 2.7 x 4.3 cm mass within
the inferior aspect the left lobe of the thyroid is unchanged since
the [DATE] examination, previously, 3.8 x 2.7 x 4.3 cm, with slight
differences likely total to scan plane projection. Correlation with
prior biopsy results is recommended.
IMPRESSION: 1. Similar findings of multinodular goiter. No definitive new or
enlarging thyroid nodules. Specifically, the previously biopsied
nodules within the right and left lobe of the thyroid are unchanged.
Correlation with prior biopsy results is recommended.
2. A 1 year follow-up ultrasound is recommended to ensure continued
stability of the 2 cm nodule within the left lobe of the thyroid.
This examination documents greater than 1.5 years of stability.

The above is in keeping with the ACR TI-RADS recommendations - [HOSPITAL] 3737;[DATE].

## 2018-05-18 ENCOUNTER — Telehealth: Payer: BLUE CROSS/BLUE SHIELD | Admitting: Family

## 2018-05-18 DIAGNOSIS — R059 Cough, unspecified: Secondary | ICD-10-CM

## 2018-05-18 DIAGNOSIS — R05 Cough: Secondary | ICD-10-CM | POA: Diagnosis not present

## 2018-05-18 MED ORDER — PREDNISONE 10 MG (21) PO TBPK
ORAL_TABLET | ORAL | 0 refills | Status: DC
Start: 2018-05-18 — End: 2019-03-12

## 2018-05-18 NOTE — Progress Notes (Signed)

## 2018-10-15 IMAGING — US US EXTREM LOW*L* LIMITED
1 series · 14 of 24 positions shown · non-contrast
Comparison: None.

CLINICAL DATA: Left thigh soft tissue mass.

EXAM:
ULTRASOUND LEFT LOWER EXTREMITY LIMITED
TECHNIQUE: Ultrasound examination of the lower extremity soft tissues was
performed in the area of clinical concern.

[Series 1: us extrem low*left* limited · 24 acquisitions, 14 frames shown]
[im 1/24]
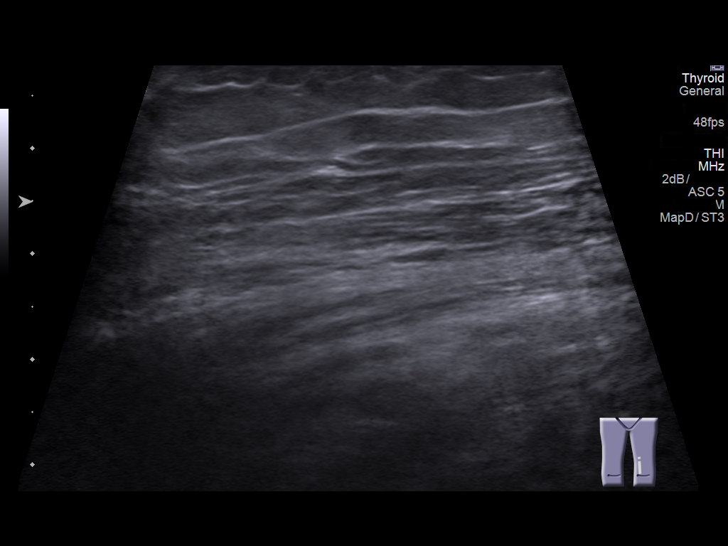
[im 3/24]
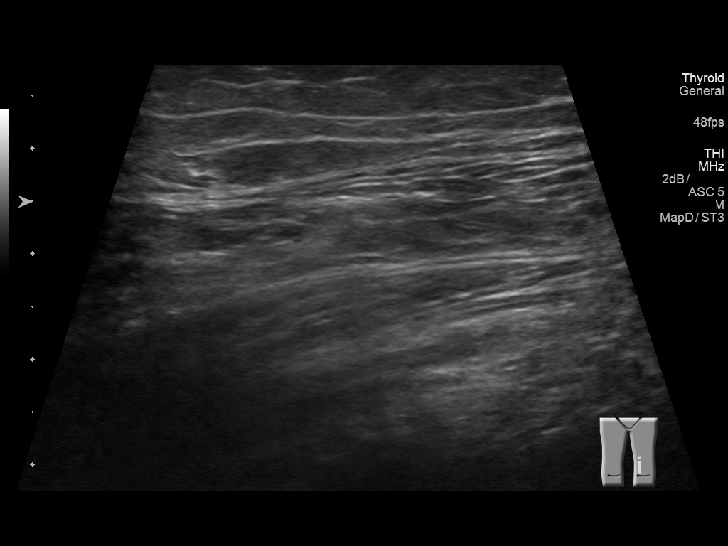
[im 5/24]
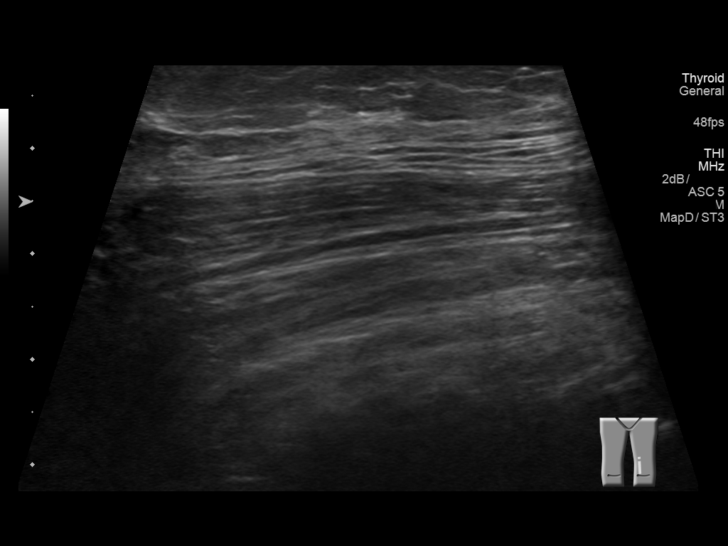
[im 7/24]
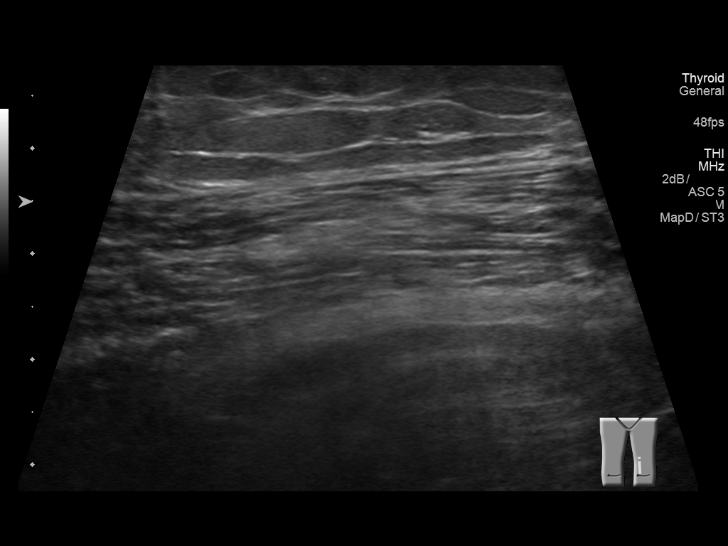
[im 8/24]
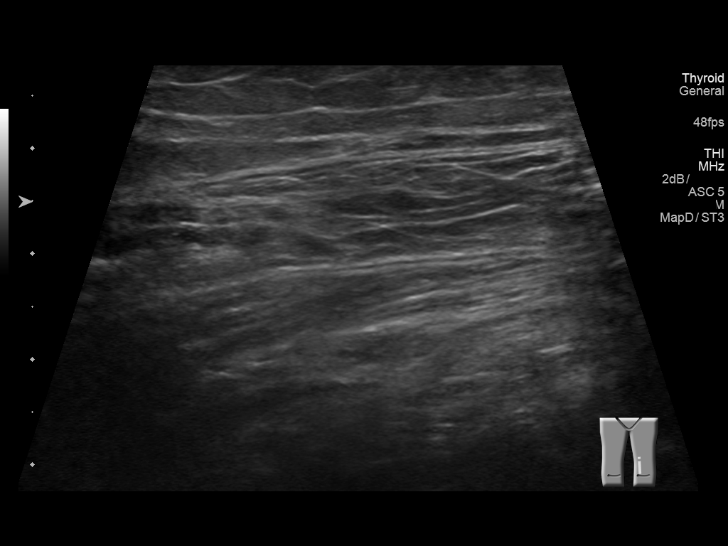
[im 10/24]
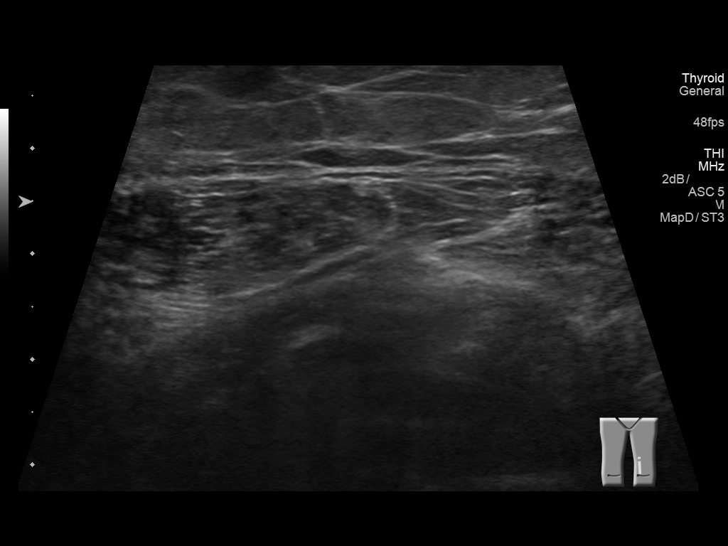
[im 12/24]
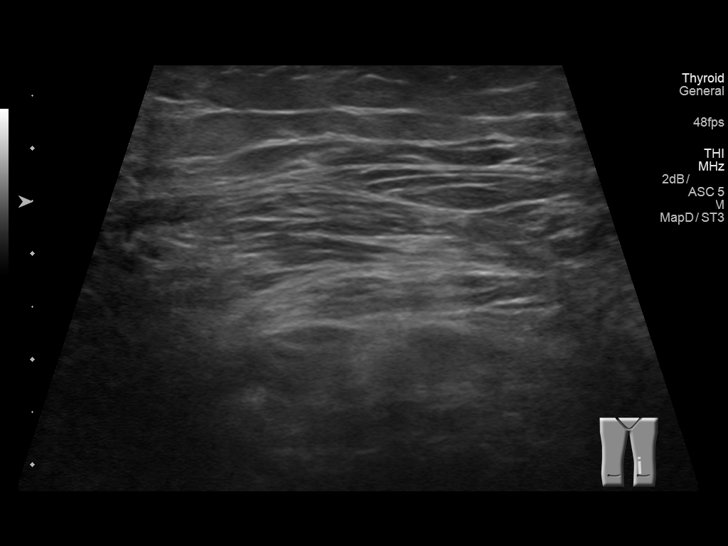
[im 13/24]
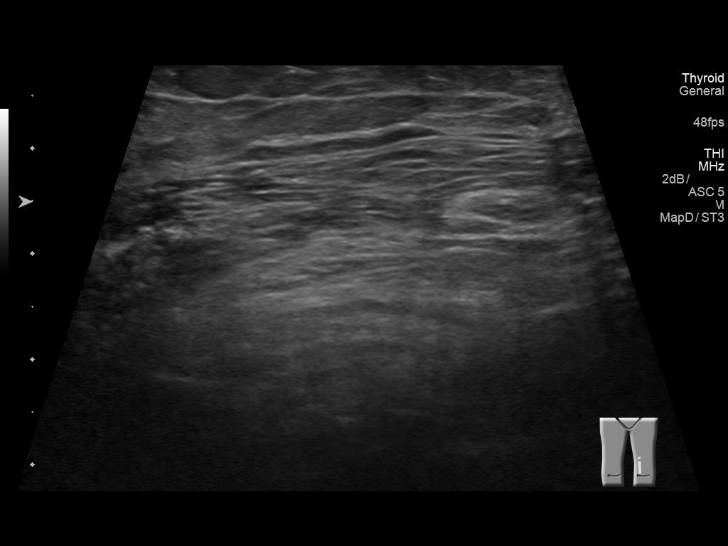
[im 15/24]
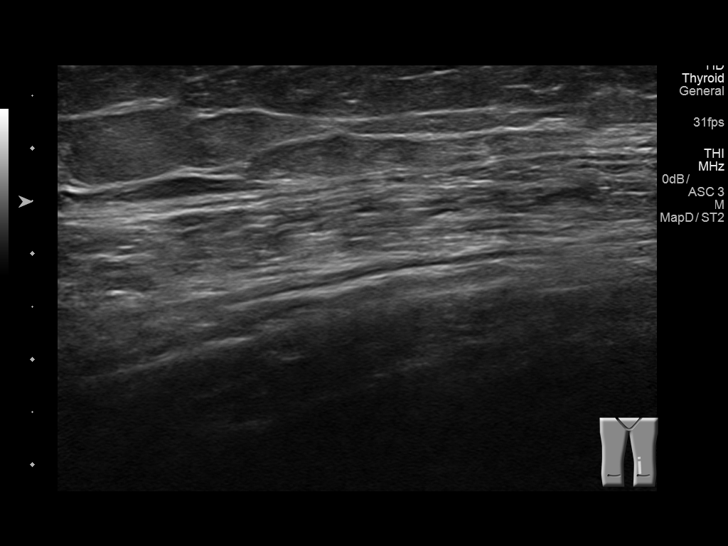
[im 17/24]
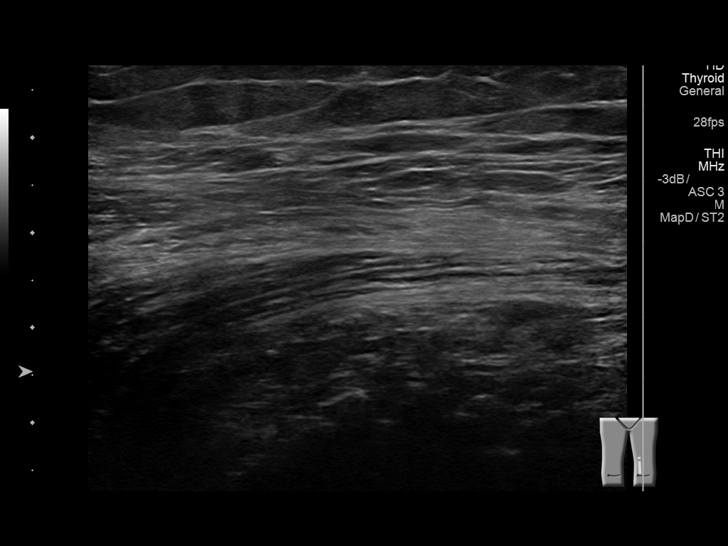
[im 19/24]
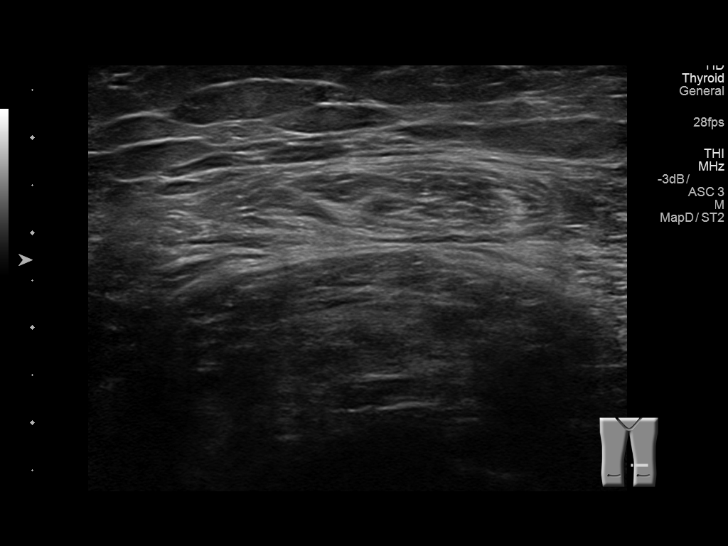
[im 20/24]
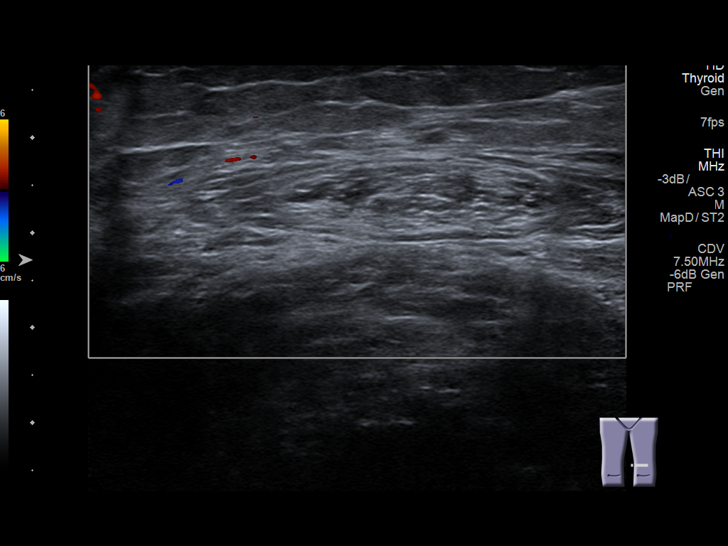
[im 22/24]
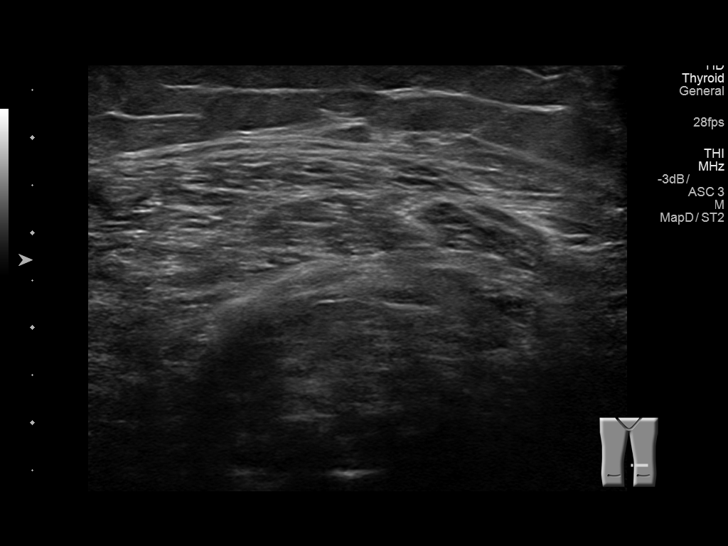
[im 24/24]
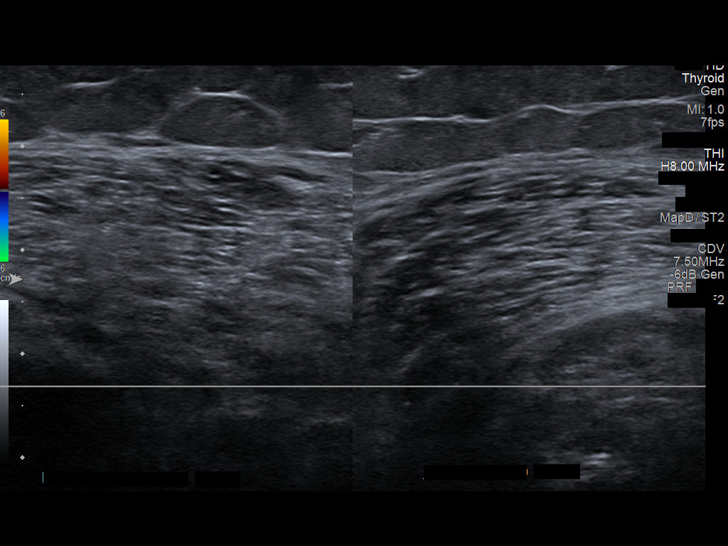

[14 of 24 positions shown; findings below may reference images not displayed]

FINDINGS: No cystic or solid abnormality identified.  No mass lesion noted.
IMPRESSION: No acute or focal abnormality.

## 2018-11-01 ENCOUNTER — Other Ambulatory Visit: Payer: Self-pay

## 2018-11-01 ENCOUNTER — Encounter (INDEPENDENT_AMBULATORY_CARE_PROVIDER_SITE_OTHER): Payer: Self-pay | Admitting: Internal Medicine

## 2018-11-01 ENCOUNTER — Ambulatory Visit (INDEPENDENT_AMBULATORY_CARE_PROVIDER_SITE_OTHER): Payer: BLUE CROSS/BLUE SHIELD | Admitting: Internal Medicine

## 2018-11-01 ENCOUNTER — Encounter (INDEPENDENT_AMBULATORY_CARE_PROVIDER_SITE_OTHER): Payer: Self-pay | Admitting: *Deleted

## 2018-11-01 VITALS — BP 146/88 | HR 72 | Ht 66.0 in | Wt 175.6 lb

## 2018-11-01 DIAGNOSIS — E282 Polycystic ovarian syndrome: Secondary | ICD-10-CM

## 2018-11-01 DIAGNOSIS — E2839 Other primary ovarian failure: Secondary | ICD-10-CM

## 2018-11-01 DIAGNOSIS — E559 Vitamin D deficiency, unspecified: Secondary | ICD-10-CM

## 2018-11-01 DIAGNOSIS — E119 Type 2 diabetes mellitus without complications: Secondary | ICD-10-CM

## 2018-11-01 DIAGNOSIS — Z1211 Encounter for screening for malignant neoplasm of colon: Secondary | ICD-10-CM

## 2018-11-01 NOTE — Patient Instructions (Signed)

## 2018-11-01 NOTE — Progress Notes (Signed)
Wellness Office Visit  Subjective:  Patient ID: Gwendolyn [name], female    DOB: September 13, 1965  Age: 53 y.o. MRN: [id number]  CC: This lady comes in for follow-up of her multiple medical problems including diabetes, PCOS, tobacco abuse, menopausal symptoms.  HPI On the last visit, I increased her estradiol and progesterone and she feels so much better.  Her sleep is fantastic on progesterone and she likes the way estradiol makes her feel.  She also was taking testosterone cream but she has really not seen much benefit from this and she is somewhat concerned because she is had increased hair growth with testosterone.  She does have underlying PCOS so this is not surprising. As far as her diabetes is concerned, her last hemoglobin A1c was 6.5%.  She is not taking any medications for this.  She is trying to control this with diet alone.  She denies any polyuria or polydipsia. She has not seen an eye doctor for more than a year and I have encouraged her to do so.  Past Medical History:  Diagnosis Date  . Diabetes mellitus without complication ([place])   . History of TIAs   . Palpitations   . Polycystic ovarian disease   . Tobacco abuse       Family History  Problem Relation Age of Onset  . Hypertension Mother   . Diabetes Father   . Diabetes Brother     Social History   Social History Narrative   Exercises rarely. Smokes tobacco. 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 Eats meat fruit and vegetables. Married for 21 years,lives with husband.     Current Meds  Medication Sig  . albuterol (PROAIR HFA) 108 (90 Base) MCG/ACT inhaler Inhale 2 puffs into the lungs every 6 (six) hours as needed.  [name] estradiol (ESTRACE) 1 MG tablet Take 1 tablet by mouth daily at 12 noon.  [name] LINZESS 72 MCG capsule Take 1 capsule by mouth daily at 12 noon.  . progesterone (PROMETRIUM) 200 MG capsule Take 1 capsule by mouth daily.     Nutrition   She is trying to eat healthy but has not been consistent.  Intermittent fasting was not going to be an option for her because of her schedule of work. Sleep  Sleep is much improved since starting progesterone at a higher dose.  Exercise  She walks at work constantly, likely more than 10,000 steps every day but she does no other formal exercise. Bio Identical Hormones  Estradiol is being used in this patient for multiple benefits based on several studies including protection against heart disease, cerebrovascular disease, osteoporosis, colon cancer, Alzheimer's disease, macular degeneration and cataracts. The patient has been counseled regarding benefits and side effects and modes of administration. The patient is agreeable that this therapy is an integral to part of her wellness, quality of life and prevention of chronic disease.  Micronized progesterone is being used in this patient for multiple benefits based on studies including protection against uterine cancer, breast cancer, osteoporosis and heart disease. The patient has been counseled regarding side effects, benefits and modes of administration. The patient is agreeable that this therapy is an integral part of her wellness, quality of life and prevention of chronic disease.  Objective:   Today's Vitals: BP (!) 146/88   Pulse 72   Ht 5\' 6"  (1.676 m)   Wt 175 lb 9.6 oz (79.7 kg)   LMP 03/30/2011   BMI 28.34 kg/m  Vitals  with BMI 11/01/2018 12/15/2016 11/19/2016  Height 5\' 6"  5\' 6"  5\' 6"   Weight 175 lbs 10 oz 179 lbs 8 oz 180 lbs  BMI 28.36 [id number]  Systolic 123456 [id number]  Diastolic 88 74 82  Pulse 72 73 77     Physical Exam  She looks systemically well.  Blood pressure is somewhat elevated today, higher than previous.  She is somewhat stressed today.  She is alert and orientated without any focal neurological signs.     Assessment   1. Colon cancer screening   2. Type 2 diabetes mellitus without complication,  without long-term current use of insulin ([place])   3. Female hypogonadism syndrome   4. PCOS (polycystic ovarian syndrome)   5. Vitamin D deficiency disease      Plan: 1. She will continue with all medications for chronic conditions above. 2. Blood tests are ordered as below. 3. Further recommendations will depend on blood results.  I will send her for colon cancer screening. 4. Follow-up in about 3 months time.  Tests ordered Orders Placed This Encounter  Procedures  . Estradiol  . Progesterone  . Hemoglobin A1c  . VITAMIN D 25 Hydroxy (Vit-D Deficiency, Fractures)  . COMPLETE METABOLIC PANEL WITH GFR  . Ambulatory referral to Gastroenterology     Doree Albee, MD

## 2018-11-02 LAB — VITAMIN D 25 HYDROXY (VIT D DEFICIENCY, FRACTURES): Vit D, 25-Hydroxy: 67 ng/mL (ref 30–100)

## 2018-11-02 LAB — COMPLETE METABOLIC PANEL WITH GFR
AG Ratio: 1.8 (calc) (ref 1.0–2.5)
ALT: 28 U/L (ref 6–29)
AST: 17 U/L (ref 10–35)
Albumin: 4.3 g/dL (ref 3.6–5.1)
Alkaline phosphatase (APISO): 62 U/L (ref 37–153)
BUN: 17 mg/dL (ref 7–25)
CO2: 26 mmol/L (ref 20–32)
Calcium: 9.3 mg/dL (ref 8.6–10.4)
Chloride: 109 mmol/L (ref 98–110)
Creat: 0.74 mg/dL (ref 0.50–1.05)
GFR, Est African American: 109 mL/min/{1.73_m2} (ref >=60)
GFR, Est Non African American: 94 mL/min/{1.73_m2} (ref >=60)
Globulin: 2.4 g/dL (calc) (ref 1.9–3.7)
Glucose, Bld: 118 mg/dL — ABNORMAL HIGH (ref 65–99)
Potassium: 4.5 mmol/L (ref 3.5–5.3)
Sodium: 140 mmol/L (ref 135–146)
Total Bilirubin: 0.4 mg/dL (ref 0.2–1.2)
Total Protein: 6.7 g/dL (ref 6.1–8.1)

## 2018-11-02 LAB — ESTRADIOL: Estradiol: 53 pg/mL

## 2018-11-02 LAB — HEMOGLOBIN A1C
Hgb A1c MFr Bld: 7 % of total Hgb — ABNORMAL HIGH (ref <5.7)
Mean Plasma Glucose: 154 (calc)
eAG (mmol/L): 8.5 (calc)

## 2018-11-02 LAB — PROGESTERONE: Progesterone: 4.8 ng/mL

## 2018-11-02 NOTE — Progress Notes (Signed)
Your diabetes is stable with a hemoglobin A1c of 7%.  Continue to work on intermittent fasting that we had discussed together with eating healthier. Your estradiol and progesterone levels are still not optimal. Please increase the estradiol so that you are taking 1-1/2 tablets of estradiol 1 mg.  This will mean you will take estradiol 1.5 mg daily. Also, increase progesterone so that you are taking 2 capsules in the evening of progesterone 200 mg.  This will mean that you will be taking progesterone 400 mg in the evening.  If the progesterone with a higher dose makes you very drowsy in the morning, please take this about an hour earlier than you usually do and see how this works out. Your vitamin D levels are okay, so continue with the same dose of vitamin D3. If you have any questions, please do not hesitate to contact me via my chart.  Otherwise, I will see on the next visit, be well!

## 2018-11-20 ENCOUNTER — Other Ambulatory Visit (INDEPENDENT_AMBULATORY_CARE_PROVIDER_SITE_OTHER): Payer: Self-pay | Admitting: Internal Medicine

## 2018-11-20 ENCOUNTER — Telehealth (INDEPENDENT_AMBULATORY_CARE_PROVIDER_SITE_OTHER): Payer: Self-pay | Admitting: Internal Medicine

## 2018-11-20 ENCOUNTER — Telehealth (INDEPENDENT_AMBULATORY_CARE_PROVIDER_SITE_OTHER): Payer: Self-pay

## 2018-11-20 MED ORDER — ESTRADIOL 1 MG PO TABS: 1.0000 mg | ORAL_TABLET | Freq: Every day | ORAL | 3 refills | Status: DC

## 2018-11-20 MED ORDER — PROGESTERONE MICRONIZED 200 MG PO CAPS: 400.0000 mg | ORAL_CAPSULE | Freq: Every day | ORAL | 3 refills | Status: DC

## 2018-11-20 MED ORDER — PROGESTERONE MICRONIZED 200 MG PO CAPS: 200.0000 mg | ORAL_CAPSULE | Freq: Every day | ORAL | 3 refills | Status: DC

## 2018-11-20 MED ORDER — ESTRADIOL 1 MG PO TABS: 1.5000 mg | ORAL_TABLET | Freq: Every day | ORAL | 3 refills | Status: DC

## 2018-11-20 NOTE — Telephone Encounter (Signed)
Please let the patient know that I have sent the appropriate dosing now to [facility] on [address].

## 2018-11-20 NOTE — Telephone Encounter (Signed)
Done

## 2018-11-20 NOTE — Telephone Encounter (Signed)
Pt was called & botified.

## 2018-12-27 ENCOUNTER — Encounter (INDEPENDENT_AMBULATORY_CARE_PROVIDER_SITE_OTHER): Payer: Self-pay | Admitting: Internal Medicine

## 2019-01-31 ENCOUNTER — Other Ambulatory Visit (INDEPENDENT_AMBULATORY_CARE_PROVIDER_SITE_OTHER): Payer: Self-pay | Admitting: Internal Medicine

## 2019-01-31 ENCOUNTER — Ambulatory Visit (INDEPENDENT_AMBULATORY_CARE_PROVIDER_SITE_OTHER): Payer: BC Managed Care – PPO | Admitting: Internal Medicine

## 2019-01-31 MED ORDER — PROGESTERONE MICRONIZED 200 MG PO CAPS: 200.0000 mg | ORAL_CAPSULE | Freq: Every day | ORAL | 0 refills | Status: DC

## 2019-01-31 MED ORDER — ESTRADIOL 1 MG PO TABS: 1.0000 mg | ORAL_TABLET | Freq: Every day | ORAL | 0 refills | Status: DC

## 2019-03-07 ENCOUNTER — Ambulatory Visit (INDEPENDENT_AMBULATORY_CARE_PROVIDER_SITE_OTHER): Payer: BC Managed Care – PPO | Admitting: Internal Medicine

## 2019-03-12 ENCOUNTER — Other Ambulatory Visit: Payer: Self-pay

## 2019-03-12 ENCOUNTER — Ambulatory Visit (INDEPENDENT_AMBULATORY_CARE_PROVIDER_SITE_OTHER): Payer: BC Managed Care – PPO | Admitting: Internal Medicine

## 2019-03-12 ENCOUNTER — Encounter (INDEPENDENT_AMBULATORY_CARE_PROVIDER_SITE_OTHER): Payer: Self-pay | Admitting: Internal Medicine

## 2019-03-12 VITALS — BP 144/88 | HR 88 | Temp 97.8°F | Resp 18 | Ht 66.0 in | Wt 190.0 lb

## 2019-03-12 DIAGNOSIS — E119 Type 2 diabetes mellitus without complications: Secondary | ICD-10-CM

## 2019-03-12 DIAGNOSIS — E2839 Other primary ovarian failure: Secondary | ICD-10-CM

## 2019-03-12 DIAGNOSIS — E559 Vitamin D deficiency, unspecified: Secondary | ICD-10-CM

## 2019-03-12 NOTE — Patient Instructions (Signed)
[facility] Dietary Recommendations for Weight Loss What to Avoid . Avoid added sugars o Often added sugar can be found in processed foods such as many condiments, dry cereals, cakes, cookies, chips, crisps, crackers, candies, sweetened drinks, etc.  o Read labels and AVOID/DECREASE use of foods with the following in their ingredient list: Sugar, fructose, high fructose corn syrup, sucrose, glucose, maltose, dextrose, molasses, cane sugar, brown sugar, any type of syrup, agave nectar, etc.   . Avoid snacking in between meals . Avoid foods made with flour o If you are going to eat food made with flour, choose those made with whole-grains; and, minimize your consumption as much as is tolerable . Avoid processed foods o These foods are generally stocked in the middle of the grocery store. Focus on shopping on the perimeter of the grocery.  . Avoid Meat  o We recommend following a plant-based diet at [facility]. Thus, we recommend avoiding meat as a general rule. Consider eating beans, legumes, eggs, and/or dairy products for regular protein sources o If you plan on eating meat limit to 4 ounces of meat at a time and choose lean options such as Fish, chicken, turkey. Avoid red meat intake such as pork and/or steak What to Include . Vegetables o GREEN LEAFY VEGETABLES: Kale, spinach, mustard greens, collard greens, cabbage, broccoli, etc. o OTHER: Asparagus, cauliflower, eggplant, carrots, peas, Brussel sprouts, tomatoes, bell peppers, zucchini, beets, cucumbers, etc. . Grains, seeds, and legumes o Beans: kidney beans, black eyed peas, garbanzo beans, black beans, pinto beans, etc. o Whole, unrefined grains: brown rice, barley, bulgur, oatmeal, etc. . Healthy fats  o Avoid highly processed fats such as vegetable oil o Examples of healthy fats: avocado, olives, virgin olive oil, dark chocolate (?72% Cocoa), nuts (peanuts, almonds, walnuts, cashews, pecans, etc.) . None to Low  Intake of Animal Sources of Protein o Meat sources: chicken, turkey, salmon, tuna. Limit to 4 ounces of meat at one time. o Consider limiting dairy sources, but when choosing dairy focus on: PLAIN Greek yogurt, cottage cheese, high-protein milk . Fruit o Choose berries  When to Eat . Intermittent Fasting: o Choosing not to eat for a specific time period, but DO FOCUS ON HYDRATION when fasting o Multiple Techniques: - Time Restricted Eating: eat 3 meals in a day, each meal lasting no more than 60 minutes, no snacks between meals - 16-18 hour fast: fast for 16 to 18 hours up to 7 days a week. Often suggested to start with 2-3 nonconsecutive days per week.  . Remember the time you sleep is counted as fasting.  . Examples of eating schedule: Fast from 7:00pm-11:00am. Eat between 11:00am-7:00pm.  - 24-hour fast: fast for 24 hours up to every other day. Often suggested to start with 1 day per week . Remember the time you sleep is counted as fasting . Examples of eating schedule:  o Eating day: eat 2-3 meals on your eating day. If doing 2 meals, each meal should last no more than 90 minutes. If doing 3 meals, each meal should last no more than 60 minutes. Finish last meal by 7:00pm. o Fasting day: Fast until 7:00pm.  o IF YOU FEEL UNWELL FOR ANY REASON/IN ANY WAY WHEN FASTING, STOP FASTING BY EATING A NUTRITIOUS SNACK OR LIGHT MEAL o ALWAYS FOCUS ON HYDRATION DURING FASTS - Acceptable Hydration sources: water, broths, tea/coffee (black tea/coffee is best but using a small amount of whole-fat dairy products in coffee/tea is acceptable).  -   Poor Hydration Sources: anything with sugar or artificial sweeteners added to it  These recommendations have been developed for patients that are actively receiving medical care from either Dr. Thelmer Legler or Sarah Gray, DNP, NP-C at [facility]. These recommendations are developed for patients with specific medical conditions and are not meant to be  distributed or used by others that are not actively receiving care from either provider listed above at [facility]. It is not appropriate to participate in the above eating plans without proper medical supervision.   Reference: Fung, J. The obesity code. [place]: [place]; 2016.   

## 2019-03-12 NOTE — Progress Notes (Signed)
Metrics: Intervention Frequency ACO  Documented Smoking Status Yearly  Screened one or more times in 24 months  Cessation Counseling or  Active cessation medication Past 24 months  Past 24 months   Guideline developer: UpToDate (See UpToDate for funding source) Date Released: 2014       Wellness Office Visit  Subjective:  Patient ID: Gwendolyn [name], female    DOB: 09-02-1965  Age: 54 y.o. MRN: [id number]  CC: This lady comes in for follow-up of diabetes, polycystic ovarian disease, vitamin D deficiency. HPI  She is doing reasonably well but she has gained weight.  Her nutrition is not consistent at the present time. She is not taking any medication for right  for her diabetes.  Her last hemoglobin A1c was just over 7%. She continues on bioidentical hormone therapy for symptoms of menopause and she was unable to tolerate higher doses.  With the dose currently, she still tends to complain of breast tenderness. Past Medical History:  Diagnosis Date  . Diabetes mellitus without complication ([place])   . History of TIAs   . Palpitations   . Polycystic ovarian disease   . Tobacco abuse       Family History  Problem Relation Age of Onset  . Hypertension Mother   . Diabetes Father   . Diabetes Brother     Social History   Social History Narrative   Exercises rarely. Smokes tobacco. 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 Eats meat fruit and vegetables. Married for 54 years,lives with husband.   Social History   Tobacco Use  . Smoking status: Current Every Day Smoker    Packs/day: 0.25    Years: 20.00    Pack years: 5.00  . Smokeless tobacco: Never Used  Substance Use Topics  . Alcohol use: Yes    Alcohol/week: 5.0 standard drinks    Types: 5 Glasses of wine per week    Current Meds  Medication Sig  . albuterol (PROAIR HFA) 108 (90 Base) MCG/ACT inhaler Inhale 2 puffs into the lungs every 6 (six) hours as  needed.  . Cholecalciferol (VITAMIN D3) 125 MCG (5000 UT) TABS Take 2 tablets by mouth daily.  [name] estradiol (ESTRACE) 1 MG tablet Take 1 tablet (1 mg total) by mouth daily at 12 noon.  [name] LINZESS 72 MCG capsule Take 1 capsule by mouth daily at 12 noon.  . progesterone (PROMETRIUM) 200 MG capsule Take 1 capsule (200 mg total) by mouth daily.  . [DISCONTINUED] amoxicillin-clavulanate (AUGMENTIN) 875-125 MG tablet Take 1 tablet by mouth 2 (two) times daily.  . [DISCONTINUED] aspirin 81 MG tablet Take 81 mg by mouth daily.    . [DISCONTINUED] benzonatate (TESSALON) 100 MG capsule Take 1 capsule (100 mg total) by mouth 2 (two) times daily as needed for cough.  . [DISCONTINUED] citalopram (CELEXA) 20 MG tablet Take 1 tablet (20 mg total) by mouth daily.  . [DISCONTINUED] docusate sodium (COLACE) 100 MG capsule Take 1 capsule (100 mg total) by mouth 2 (two) times daily.  . [DISCONTINUED] exenatide (BYETTA 5 MCG PEN) 5 MCG/0.02ML SOLN Inject into the skin 2 (two) times daily with a meal.    . [DISCONTINUED] metFORMIN (GLUCOPHAGE) 500 MG tablet TAKE 1 TABLET(500 MG) BY MOUTH TWICE DAILY WITH A MEAL  . [DISCONTINUED] metoprolol tartrate (LOPRESSOR) 25 MG tablet Take as directed  . [DISCONTINUED] Misc Natural Products (PROANTHO) 470-30 MG CAPS Take by mouth as needed.    . [DISCONTINUED] mometasone (NASONEX) 50  MCG/ACT nasal spray Place 2 sprays into the nose daily.  . [DISCONTINUED] predniSONE (STERAPRED UNI-PAK 21 TAB) 10 MG (21) TBPK tablet As directed      Bio Identical Hormones  Micronized progesterone is being used in this patient for multiple benefits based on studies including protection against uterine cancer, breast cancer, osteoporosis and heart disease. The patient has been counseled regarding side effects, benefits and modes of administration. The patient is agreeable that this therapy is an integral part of her wellness, quality of life and prevention of chronic disease.  Estradiol is  being used in this patient for multiple benefits based on several studies including protection against heart disease, cerebrovascular disease, osteoporosis, colon cancer, Alzheimer's disease, macular degeneration and cataracts. The patient has been counseled regarding benefits and side effects and modes of administration. The patient is agreeable that this therapy is an integral to part of her wellness, quality of life and prevention of chronic disease.  Objective:   Today's Vitals: BP (!) 144/88 (BP Location: Left Arm, Patient Position: Sitting, Cuff Size: Normal)   Pulse 88   Temp 97.8 F (36.6 C) (Temporal)   Resp 18   Ht 5\' 6"  (1.676 m)   Wt 190 lb (86.2 kg)   LMP 03/30/2011   BMI 30.67 kg/m  Vitals with BMI 03/12/2019 11/01/2018 12/15/2016  Height 5\' 6"  5\' 6"  5\' 6"   Weight 190 lbs 175 lbs 10 oz 179 lbs 8 oz  BMI 30.68 123456 [id number]  Systolic 123456 [id number]  Diastolic 88 88 74  Pulse 88 72 73     Physical Exam   She looks systemically well but she has gained 15 pounds since the last visit.    Assessment   1. Type 2 diabetes mellitus without complication, without long-term current use of insulin ([place])   2. Female hypogonadism syndrome   3. Vitamin D deficiency disease       Tests ordered Orders Placed This Encounter  Procedures  . COMPLETE METABOLIC PANEL WITH GFR  . Hemoglobin A1c  . Estradiol  . Progesterone  . VITAMIN D 25 Hydroxy (Vit-D Deficiency, Fractures)     Plan: 1. Blood work is ordered as above. 2. We will see what her diabetes looks like with hemoglobin A1c. 3. She will continue with bioidentical hormone therapy and we may need to still adjust her doses.  I want to try and achieve a progesterone level that is 10 or higher. 4. She will continue with vitamin D3 supplementation for vitamin D deficiency and we will check levels today. 5. Follow-up in 3 months and further recommendations will depend on blood results.   No orders of the defined types  were placed in this encounter.   Doree Albee, MD

## 2019-03-13 LAB — COMPLETE METABOLIC PANEL WITH GFR
AG Ratio: 1.8 (calc) (ref 1.0–2.5)
ALT: 22 U/L (ref 6–29)
AST: 14 U/L (ref 10–35)
Albumin: 4.2 g/dL (ref 3.6–5.1)
Alkaline phosphatase (APISO): 52 U/L (ref 37–153)
BUN: 19 mg/dL (ref 7–25)
CO2: 28 mmol/L (ref 20–32)
Calcium: 9.7 mg/dL (ref 8.6–10.4)
Chloride: 104 mmol/L (ref 98–110)
Creat: 0.66 mg/dL (ref 0.50–1.05)
GFR, Est African American: 119 mL/min/{1.73_m2} (ref >=60)
GFR, Est Non African American: 102 mL/min/{1.73_m2} (ref >=60)
Globulin: 2.4 g/dL (calc) (ref 1.9–3.7)
Glucose, Bld: 180 mg/dL — ABNORMAL HIGH (ref 65–99)
Potassium: 4.5 mmol/L (ref 3.5–5.3)
Sodium: 139 mmol/L (ref 135–146)
Total Bilirubin: 0.2 mg/dL (ref 0.2–1.2)
Total Protein: 6.6 g/dL (ref 6.1–8.1)

## 2019-03-13 LAB — HEMOGLOBIN A1C
Hgb A1c MFr Bld: 7.6 % of total Hgb — ABNORMAL HIGH (ref <5.7)
Mean Plasma Glucose: 171 (calc)
eAG (mmol/L): 9.5 (calc)

## 2019-03-13 LAB — PROGESTERONE: Progesterone: 9.4 ng/mL

## 2019-03-13 LAB — VITAMIN D 25 HYDROXY (VIT D DEFICIENCY, FRACTURES): Vit D, 25-Hydroxy: 64 ng/mL (ref 30–100)

## 2019-03-13 LAB — ESTRADIOL: Estradiol: 49 pg/mL

## 2019-03-26 ENCOUNTER — Encounter (INDEPENDENT_AMBULATORY_CARE_PROVIDER_SITE_OTHER): Payer: Self-pay | Admitting: Internal Medicine

## 2019-03-26 ENCOUNTER — Other Ambulatory Visit: Payer: Self-pay

## 2019-03-26 ENCOUNTER — Other Ambulatory Visit (INDEPENDENT_AMBULATORY_CARE_PROVIDER_SITE_OTHER): Payer: Self-pay | Admitting: Internal Medicine

## 2019-03-26 ENCOUNTER — Ambulatory Visit: Payer: BC Managed Care – PPO | Attending: Internal Medicine

## 2019-03-26 DIAGNOSIS — Z20822 Contact with and (suspected) exposure to covid-19: Secondary | ICD-10-CM

## 2019-03-26 MED ORDER — PREDNISONE 20 MG PO TABS: 40.0000 mg | ORAL_TABLET | Freq: Every day | ORAL | 1 refills | Status: DC

## 2019-03-27 LAB — NOVEL CORONAVIRUS, NAA: SARS-CoV-2, NAA: NOT DETECTED

## 2019-03-29 ENCOUNTER — Ambulatory Visit (INDEPENDENT_AMBULATORY_CARE_PROVIDER_SITE_OTHER): Payer: BC Managed Care – PPO | Admitting: Internal Medicine

## 2019-03-29 ENCOUNTER — Encounter (INDEPENDENT_AMBULATORY_CARE_PROVIDER_SITE_OTHER): Payer: Self-pay | Admitting: Internal Medicine

## 2019-03-29 ENCOUNTER — Other Ambulatory Visit: Payer: Self-pay

## 2019-03-29 VITALS — BP 128/78 | HR 74 | Temp 98.3°F | Resp 18 | Ht 66.0 in | Wt 178.0 lb

## 2019-03-29 DIAGNOSIS — R21 Rash and other nonspecific skin eruption: Secondary | ICD-10-CM

## 2019-03-29 NOTE — Progress Notes (Signed)
Metrics: Intervention Frequency ACO  Documented Smoking Status Yearly  Screened one or more times in 24 months  Cessation Counseling or  Active cessation medication Past 24 months  Past 24 months   Guideline developer: UpToDate (See UpToDate for funding source) Date Released: 2014       Wellness Office Visit  Subjective:  Patient ID: Gwendolyn [name], female    DOB: [birth year]  Age: 54 y.o. MRN: [id number]  CC: Skin rash HPI This lady presents with a now 3-day history of skin rash that is present in her arms, legs and even inside her mouth.  She took over-the-counter [place]  and 2 days later she started to get this rash. She even had a COVID-19 test done couple days ago and this was negative.  She has no other symptoms of COVID-19 disease. She had message me through [place] and I sent her course of prednisone which she has been taking for the last 3 days.  There has been no significant improvement. Systemically she feels well although in the beginning she was dyspneic. Past Medical History:  Diagnosis Date  . Diabetes mellitus without complication ([place])   . History of TIAs   . Palpitations   . Polycystic ovarian disease   . Tobacco abuse       Family History  Problem Relation Age of Onset  . Hypertension Mother   . Diabetes Father   . Diabetes Brother     Social History   Social History Narrative   Exercises rarely. Smokes tobacco. 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 Eats meat fruit and vegetables. Married for 71 years,lives with husband.   Social History   Tobacco Use  . Smoking status: Current Every Day Smoker    Packs/day: 0.25    Years: 20.00    Pack years: 5.00  . Smokeless tobacco: Never Used  Substance Use Topics  . Alcohol use: Yes    Alcohol/week: 5.0 standard drinks    Types: 5 Glasses of wine per week    Current Meds  Medication Sig  . albuterol (PROAIR HFA) 108 (90 Base) MCG/ACT  inhaler Inhale 2 puffs into the lungs every 6 (six) hours as needed.  . Cholecalciferol (VITAMIN D3) 125 MCG (5000 UT) TABS Take 2 tablets by mouth daily.  [name] estradiol (ESTRACE) 1 MG tablet Take 1 tablet (1 mg total) by mouth daily at 12 noon.  [name] LINZESS 72 MCG capsule Take 1 capsule by mouth daily at 12 noon.  . predniSONE (DELTASONE) 20 MG tablet Take 2 tablets (40 mg total) by mouth daily with breakfast.  . progesterone (PROMETRIUM) 200 MG capsule Take 1 capsule (200 mg total) by mouth daily.      Objective:   Today's Vitals: BP 128/78 (BP Location: Left Arm, Patient Position: Sitting, Cuff Size: Normal)   Pulse 74   Temp 98.3 F (36.8 C) (Temporal)   Resp 18   Ht 5\' 6"  (1.676 m)   Wt 178 lb (80.7 kg) Comment: - steel toes shoes.  LMP 03/30/2011   SpO2 97% Comment: wearing mask.  BMI 28.73 kg/m  Vitals with BMI 03/29/2019 03/12/2019 11/01/2018  Height 5\' 6"  5\' 6"  5\' 6"   Weight 178 lbs 190 lbs 175 lbs 10 oz  BMI 28.74 [id number]  Systolic 0000000 123456 123456  Diastolic 78 88 88  Pulse 74 88 72     Physical Exam   She looks systemically well.  She appears to have a maculopapular  rash on her arms, possibly even cystic elements to it.  I am not sure what this rash represents.    Assessment   1. Rash and nonspecific skin eruption       Tests ordered Orders Placed This Encounter  Procedures  . Ambulatory referral to Dermatology     Plan: 1. I can only assume the rash is allergic in nature from the over-the-counter medicine and we will give her dexamethasone 4 mg intramuscular today and she will continue the steroids.  In the meantime, I will also refer her to dermatology as soon as possible for further evaluation.   No orders of the defined types were placed in this encounter.   Doree Albee, MD

## 2019-04-01 ENCOUNTER — Encounter (INDEPENDENT_AMBULATORY_CARE_PROVIDER_SITE_OTHER): Payer: Self-pay | Admitting: Internal Medicine

## 2019-04-02 ENCOUNTER — Telehealth (INDEPENDENT_AMBULATORY_CARE_PROVIDER_SITE_OTHER): Payer: Self-pay

## 2019-04-02 ENCOUNTER — Encounter (INDEPENDENT_AMBULATORY_CARE_PROVIDER_SITE_OTHER): Payer: Self-pay | Admitting: Internal Medicine

## 2019-04-02 DIAGNOSIS — R21 Rash and other nonspecific skin eruption: Secondary | ICD-10-CM

## 2019-04-02 MED ORDER — PREDNISONE 10 MG (21) PO TBPK: ORAL_TABLET | ORAL | 0 refills | Status: DC

## 2019-04-02 NOTE — Telephone Encounter (Signed)
Please call this patient back and ask her if her rash is worsening especially if it is now causing her any pain.  If she is having pain AND the redness is worsening she should proceed to the emergency department.  If the rash is stable and she is not having pain with the rash, then let her know that I have sent in a steroid taper pack to [facility] on [address].  She will take 6 tablets by mouth on day 1, then 5 tablets by mouth on day 2, then 4 tabs by mouth on day 3, then 3 tablets by mouth on day 4, then 2 tablets by mouth on day 5, and then 1 tablet by mouth on day 6.  I do not think that she needs to be tested for Covid again if her only symptom is the skin rash and her previous test was negative, however if she is experiencing other symptoms such as fatigue, headache, cough, shortness of breath, fever, abdominal pain, diarrhea then she could consider getting tested for Covid again.  Also let her know that Dr. Anastasio Champion did refer her to dermatology for further evaluation of her rash, if she has not heard from this office to set up this appointment yet please let me know and I will see if I can send the referral again.  Thank you.

## 2019-04-02 NOTE — Telephone Encounter (Signed)
[place]  Phone Number: [phone]  Hello   Is it ok to refill the prednisone? My rash is continuing to spread. Do you think I should get re-tested for Covid?   Thanks,  Helene Kelp

## 2019-04-02 NOTE — Telephone Encounter (Signed)
The rash is the only thing she is having she understands that the Pred pak has been sent in and shes going to pick up she denies fever headache cough sob

## 2019-04-09 ENCOUNTER — Telehealth (INDEPENDENT_AMBULATORY_CARE_PROVIDER_SITE_OTHER): Payer: Self-pay

## 2019-04-09 NOTE — Telephone Encounter (Signed)
Okay, let me know

## 2019-04-10 NOTE — Telephone Encounter (Signed)
Pt did see them in January. Notes being sent to our office.

## 2019-05-25 ENCOUNTER — Other Ambulatory Visit (INDEPENDENT_AMBULATORY_CARE_PROVIDER_SITE_OTHER): Payer: Self-pay | Admitting: Internal Medicine

## 2019-06-04 ENCOUNTER — Other Ambulatory Visit: Payer: Self-pay

## 2019-06-04 ENCOUNTER — Encounter (INDEPENDENT_AMBULATORY_CARE_PROVIDER_SITE_OTHER): Payer: Self-pay | Admitting: Internal Medicine

## 2019-06-04 ENCOUNTER — Ambulatory Visit (INDEPENDENT_AMBULATORY_CARE_PROVIDER_SITE_OTHER): Payer: BC Managed Care – PPO | Admitting: Internal Medicine

## 2019-06-04 VITALS — BP 140/80 | HR 84 | Temp 97.0°F | Ht 66.0 in | Wt 176.0 lb

## 2019-06-04 DIAGNOSIS — G4730 Sleep apnea, unspecified: Secondary | ICD-10-CM

## 2019-06-04 DIAGNOSIS — E282 Polycystic ovarian syndrome: Secondary | ICD-10-CM | POA: Diagnosis not present

## 2019-06-04 DIAGNOSIS — E119 Type 2 diabetes mellitus without complications: Secondary | ICD-10-CM

## 2019-06-04 DIAGNOSIS — J4521 Mild intermittent asthma with (acute) exacerbation: Secondary | ICD-10-CM

## 2019-06-04 DIAGNOSIS — E2839 Other primary ovarian failure: Secondary | ICD-10-CM | POA: Diagnosis not present

## 2019-06-04 DIAGNOSIS — Z1211 Encounter for screening for malignant neoplasm of colon: Secondary | ICD-10-CM

## 2019-06-04 MED ORDER — ESTRADIOL 0.5 MG PO TABS: 0.5000 mg | ORAL_TABLET | Freq: Every day | ORAL | 1 refills | Status: DC

## 2019-06-04 MED ORDER — ALBUTEROL SULFATE HFA 108 (90 BASE) MCG/ACT IN AERS: 2.0000 | INHALATION_SPRAY | Freq: Four times a day (QID) | RESPIRATORY_TRACT | 6 refills | Status: DC | PRN

## 2019-06-04 MED ORDER — PROGESTERONE 200 MG PO CAPS: 200.0000 mg | ORAL_CAPSULE | Freq: Every day | ORAL | 1 refills | Status: DC

## 2019-06-04 NOTE — Progress Notes (Signed)
Metrics: Intervention Frequency ACO  Documented Smoking Status Yearly  Screened one or more times in 24 months  Cessation Counseling or  Active cessation medication Past 24 months  Past 24 months   Guideline developer: UpToDate (See UpToDate for funding source) Date Released: 2014       Wellness Office Visit  Subjective:  Patient ID: Gwendolyn [name], female    DOB: 06-04-1965  Age: 54 y.o. MRN: [id number]  CC: This lady comes in for follow-up of diabetes, PCOS, vitamin D deficiency. HPI  Last time I saw her, she had a rash which now seems to have resolved with steroids.  Unfortunately, she never got to see the dermatologist. She continues on low-dose of estradiol and progesterone.  She is tolerating these doses at the present time. She continues to take vitamin D3 supplementation. For diabetes, we will try to manage this with diet alone and she is trying to do the best she can with this.  She is under a lot of stress at home. She is concerned about sleep apnea as she has been told that she snores all the time. Past Medical History:  Diagnosis Date  . Diabetes mellitus without complication ([place])   . History of TIAs   . Palpitations   . Polycystic ovarian disease   . Tobacco abuse       Family History  Problem Relation Age of Onset  . Hypertension Mother   . Diabetes Father   . Diabetes Brother     Social History   Social History Narrative   Exercises rarely. Smokes tobacco. Works in a Cytogeneticist. Lives in [place]. Eats meat fruit and vegetables. Married for 55 years,lives with husband.   Social History   Tobacco Use  . Smoking status: Current Every Day Smoker    Packs/day: 0.25    Years: 20.00    Pack years: 5.00  . Smokeless tobacco: Never Used  Substance Use Topics  . Alcohol use: Yes    Alcohol/week: 5.0 standard drinks    Types: 5 Glasses of wine per week    Current Meds  Medication Sig  .  albuterol (PROAIR HFA) 108 (90 Base) MCG/ACT inhaler Inhale 2 puffs into the lungs every 6 (six) hours as needed.  . Cholecalciferol (VITAMIN D3) 125 MCG (5000 UT) TABS Take 2 tablets by mouth daily.  [name] LINZESS 72 MCG capsule Take 1 capsule by mouth daily at 12 noon.  . progesterone (PROMETRIUM) 200 MG capsule Take 1 capsule (200 mg total) by mouth daily.  . [DISCONTINUED] albuterol (PROAIR HFA) 108 (90 Base) MCG/ACT inhaler Inhale 2 puffs into the lungs every 6 (six) hours as needed.  . [DISCONTINUED] estradiol (ESTRACE) 1 MG tablet TAKE 1 TABLET BY MOUTH AT 12 NOON       Objective:   Today's Vitals: BP 140/80 (BP Location: Right Arm, Patient Position: Sitting, Cuff Size: Normal)   Pulse 84   Temp (!) 97 F (36.1 C) (Temporal)   Ht 5\' 6"  (1.676 m)   Wt 176 lb (79.8 kg)   LMP 03/30/2011   SpO2 95%   BMI 28.41 kg/m  Vitals with BMI 06/04/2019 03/29/2019 03/12/2019  Height 5\' 6"  5\' 6"  5\' 6"   Weight 176 lbs 178 lbs 190 lbs  BMI 28.42 [id number]  Systolic XX123456 [id number]  Diastolic 80 78 88  Pulse 84 74 88     Physical Exam   Looks systemically well.  She remains overweight but certainly weighs less  than January, having lost 14 pounds so far.  Blood pressure is under reasonable control.   Assessment   1. Type 2 diabetes mellitus without complication, without long-term current use of insulin ([place])   2. Mild intermittent asthma with acute exacerbation   3. PCOS (polycystic ovarian syndrome)   4. Female hypogonadism syndrome   5. Sleep apnea, unspecified type   6. Colon cancer screening       Tests ordered Orders Placed This Encounter  Procedures  . COMPLETE METABOLIC PANEL WITH GFR  . Hemoglobin A1c  . Estradiol  . Progesterone  . Fecal Globin By Immunochemistry  . Ambulatory referral to Neurology     Plan: 1. Blood work is ordered above and I have given her fecal occult blood testing card. 2. I will refer her to [facility] neurology for further evaluation of  possible sleep apnea. 3. Further recommendations will depend on blood results and I will see her in 3 months time for an annual physical exam. 4. Today I spent 30 minutes with this patient discussing her overall health and medical conditions above.   Meds ordered this encounter  Medications  . estradiol (ESTRACE) 0.5 MG tablet    Sig: Take 1 tablet (0.5 mg total) by mouth daily.    Dispense:  90 tablet    Refill:  1  . progesterone (PROMETRIUM) 200 MG capsule    Sig: Take 1 capsule (200 mg total) by mouth daily.    Dispense:  90 capsule    Refill:  1  . albuterol (PROAIR HFA) 108 (90 Base) MCG/ACT inhaler    Sig: Inhale 2 puffs into the lungs every 6 (six) hours as needed.    Dispense:  18 g    Refill:  6    Rande Roylance Luther Parody, MD

## 2019-06-05 ENCOUNTER — Other Ambulatory Visit (INDEPENDENT_AMBULATORY_CARE_PROVIDER_SITE_OTHER): Payer: Self-pay | Admitting: Internal Medicine

## 2019-06-05 LAB — COMPLETE METABOLIC PANEL WITH GFR
AG Ratio: 2 (calc) (ref 1.0–2.5)
ALT: 36 U/L — ABNORMAL HIGH (ref 6–29)
AST: 18 U/L (ref 10–35)
Albumin: 4.1 g/dL (ref 3.6–5.1)
Alkaline phosphatase (APISO): 60 U/L (ref 37–153)
BUN: 14 mg/dL (ref 7–25)
CO2: 28 mmol/L (ref 20–32)
Calcium: 9.7 mg/dL (ref 8.6–10.4)
Chloride: 101 mmol/L (ref 98–110)
Creat: 0.71 mg/dL (ref 0.50–1.05)
GFR, Est African American: 114 mL/min/{1.73_m2} (ref >=60)
GFR, Est Non African American: 99 mL/min/{1.73_m2} (ref >=60)
Globulin: 2.1 g/dL (calc) (ref 1.9–3.7)
Glucose, Bld: 253 mg/dL — ABNORMAL HIGH (ref 65–99)
Potassium: 4.2 mmol/L (ref 3.5–5.3)
Sodium: 137 mmol/L (ref 135–146)
Total Bilirubin: 0.2 mg/dL (ref 0.2–1.2)
Total Protein: 6.2 g/dL (ref 6.1–8.1)

## 2019-06-05 LAB — HEMOGLOBIN A1C
Hgb A1c MFr Bld: 9.2 % of total Hgb — ABNORMAL HIGH (ref <5.7)
Mean Plasma Glucose: 217 (calc)
eAG (mmol/L): 12 (calc)

## 2019-06-05 LAB — PROGESTERONE: Progesterone: 0.9 ng/mL

## 2019-06-05 LAB — ESTRADIOL: Estradiol: 41 pg/mL

## 2019-06-05 MED ORDER — PROGESTERONE 200 MG PO CAPS: 400.0000 mg | ORAL_CAPSULE | Freq: Every day | ORAL | 1 refills | Status: DC

## 2019-06-05 MED ORDER — METFORMIN HCL 500 MG PO TABS: 500.0000 mg | ORAL_TABLET | Freq: Two times a day (BID) | ORAL | 3 refills | Status: DC

## 2019-06-14 ENCOUNTER — Ambulatory Visit: Payer: BC Managed Care – PPO | Admitting: Neurology

## 2019-06-14 ENCOUNTER — Encounter: Payer: Self-pay | Admitting: Neurology

## 2019-06-14 ENCOUNTER — Other Ambulatory Visit: Payer: Self-pay

## 2019-06-14 VITALS — BP 134/80 | HR 82 | Temp 97.8°F | Ht 66.0 in | Wt 176.0 lb

## 2019-06-14 DIAGNOSIS — G4719 Other hypersomnia: Secondary | ICD-10-CM | POA: Diagnosis not present

## 2019-06-14 DIAGNOSIS — E663 Overweight: Secondary | ICD-10-CM | POA: Diagnosis not present

## 2019-06-14 DIAGNOSIS — R0683 Snoring: Secondary | ICD-10-CM | POA: Diagnosis not present

## 2019-06-14 DIAGNOSIS — R0681 Apnea, not elsewhere classified: Secondary | ICD-10-CM

## 2019-06-14 LAB — FECAL GLOBIN BY IMMUNOCHEMISTRY: FECAL GLOBIN RESULT:: NOT DETECTED

## 2019-06-14 NOTE — Progress Notes (Signed)
Subjective:    Patient ID: Gwendolyn [name] is a 54 y.o. female.  HPI     Star Age, MD, PhD [facility] [address], [address] [place], [place] [address]   Dear Dr. Anastasio Champion,  I saw your patient, Gwendolyn [name], upon your kind request in my sleep clinic today for initial consultation of her sleep disorder, in particular, concern for underlying obstructive sleep apnea.  The patient is unaccompanied today.  As you know, Gwendolyn [name] is a 54 year old right-handed woman with an underlying medical history of diabetes, history of TIA, PCOS, palpitations, smoking and overweight state, who reports snoring and excessive daytime somnolence.  She has had witnessed apneic spells per family report, her husband and her mother have noted pauses in her breathing while she is asleep and she also makes gasping sounds at times.  She does not have night to night nocturia or a family history of sleep apnea or recurrent morning headaches.  Her son had to have his tonsils and adenoids removed with concern for sleep apnea when he was 52 years old.  She works as a Sales promotion account executive for [employer].  Her bedtime is around 8 and rise time around 3:30 AM.  I reviewed your office note from 06/04/2019.  Her Epworth sleepiness score is 12/24, fatigue severity score is 45 out of 63. She reports significant work-related stress.  She lives with her husband and her son who recently moved in, he is 52 years old and has 2 kids.  She has a daughter who has 1 child.  She has 2 dogs in the household, none of them typically in her bedroom.  She has a TV on at night and turns it off in the middle of the night.  She denies any telltale symptoms of restless leg syndrome or leg twitching at night.  She drinks caffeine in the form of coffee, 2 cups in the mornings, smokes 1 pack/day and is currently not working on smoking cessation quite yet, drinks alcohol in the form of wine, 2 glasses every afternoon on  average.  Her Past Medical History Is Significant For: Past Medical History:  Diagnosis Date  . Diabetes mellitus without complication ([place])   . History of TIAs   . Palpitations   . Polycystic ovarian disease   . Tobacco abuse     Her Past Surgical History Is Significant For: Past Surgical History:  Procedure Laterality Date  . TUBAL LIGATION      Her Family History Is Significant For: Family History  Problem Relation Age of Onset  . Hypertension Mother   . Diabetes Father   . Diabetes Brother     Her Social History Is Significant For: Social History   Socioeconomic History  . Marital status: Married    Spouse name: Not on file  . Number of children: Not on file  . Years of education: Not on file  . Highest education level: Not on file  Occupational History  . Not on file  Tobacco Use  . Smoking status: Current Every Day Smoker    Packs/day: 0.25    Years: 20.00    Pack years: 5.00  . Smokeless tobacco: Never Used  Substance and Sexual Activity  . Alcohol use: Yes    Alcohol/week: 5.0 standard drinks    Types: 5 Glasses of wine per week  . Drug use: No  . Sexual activity: Not Currently    Birth control/protection: Post-menopausal  Other Topics Concern  .  Not on file  Social History Narrative   Exercises rarely. Smokes tobacco. 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 Eats meat fruit and vegetables. Married for 20 years,lives with husband.   Social Determinants of Health   Financial Resource Strain:   . Difficulty of Paying Living Expenses:   Food Insecurity:   . Worried About Charity fundraiser in the Last Year:   . Arboriculturist in the Last Year:   Transportation Needs:   . Film/video editor (Medical):   [name] Lack of Transportation (Non-Medical):   Physical Activity:   . Days of Exercise per Week:   . Minutes of Exercise per Session:   Stress:   . Feeling of Stress :   Social  Connections:   . Frequency of Communication with Friends and Family:   . Frequency of Social Gatherings with Friends and Family:   . Attends Religious Services:   . Active Member of Clubs or Organizations:   . Attends Archivist Meetings:   [name] Marital Status:     Her Allergies Are:  Allergies  Allergen Reactions  . Lidocaine Anaphylaxis, Other (See Comments) and Nausea And Vomiting  :   Her Current Medications Are:  Outpatient Encounter Medications as of 06/14/2019  Medication Sig  . albuterol (PROAIR HFA) 108 (90 Base) MCG/ACT inhaler Inhale 2 puffs into the lungs every 6 (six) hours as needed.  . Cholecalciferol (VITAMIN D3) 125 MCG (5000 UT) TABS Take 2 tablets by mouth daily.  [name] estradiol (ESTRACE) 0.5 MG tablet Take 1 tablet (0.5 mg total) by mouth daily.  [name] LINZESS 72 MCG capsule Take 1 capsule by mouth daily at 12 noon.  . metFORMIN (GLUCOPHAGE) 500 MG tablet Take 1 tablet (500 mg total) by mouth 2 (two) times daily with a meal.  . progesterone (PROMETRIUM) 200 MG capsule Take 2 capsules (400 mg total) by mouth daily.   No facility-administered encounter medications on file as of 06/14/2019.  :  Review of Systems:  Out of a complete 14 point review of systems, all are reviewed and negative with the exception of these symptoms as listed below:  Review of Systems  Neurological:       Here for sleep consult. No prior sleep study. Pt reports snoring and daytime sleepiness/fatigue are present.   Epworth Sleepiness Scale 0= would never doze 1= slight chance of dozing 2= moderate chance of dozing 3= high chance of dozing  Sitting and reading:3 Watching TV:2 Sitting inactive in a public place (ex. Theater or meeting):2 As a passenger in a car for an hour without a break:1 Lying down to rest in the afternoon:3 Sitting and talking to someone:0 Sitting quietly after lunch (no alcohol):1 In a car, while stopped in traffic:0 Total:12     Objective:   Neurological Exam  Physical Exam Physical Examination:   Vitals:   06/14/19 0859  BP: 134/80  Pulse: 82  Temp: 97.8 F (36.6 C)    General Examination: The patient is a very pleasant 54 y.o. female in no acute distress. She appears well-developed and well-nourished and well groomed.   HEENT: Normocephalic, atraumatic, pupils are equal, round and reactive to light, extraocular tracking is good without limitation to gaze excursion or nystagmus noted. Hearing is grossly intact. Face is symmetric with normal facial animation. Speech is clear with no dysarthria noted. There is no hypophonia. There is no lip, neck/head, jaw or voice tremor. Neck is supple with full  range of passive and active motion. There are no carotid bruits on auscultation. Oropharynx exam reveals: mild mouth dryness, adequate dental hygiene and mild airway crowding, due to smaller airway entry. Mallampati is class II. Tongue protrudes centrally and palate elevates symmetrically. Tonsils are small, neck size is 14 7/8 inches. She has a Mild overbite.    Chest: Clear to auscultation without wheezing, rhonchi or crackles noted.  Heart: S1+S2+0, regular and normal without murmurs, rubs or gallops noted.   Abdomen: Soft, non-tender and non-distended with normal bowel sounds appreciated on auscultation.  Extremities: There is no pitting edema in the distal lower extremities bilaterally.   Skin: Warm and dry without trophic changes noted.   Musculoskeletal: exam reveals no obvious joint deformities, tenderness or joint swelling or erythema.   Neurologically:  Mental status: The patient is awake, alert and oriented in all 4 spheres. Her immediate and remote memory, attention, language skills and fund of knowledge are appropriate. There is no evidence of aphasia, agnosia, apraxia or anomia. Speech is clear with normal prosody and enunciation. Thought process is linear. Mood is normal and affect is normal.  Cranial nerves II -  XII are as described above under HEENT exam.  Motor exam: Normal bulk, strength and tone is noted. There is no tremor, Romberg is negative. Fine motor skills and coordination: grossly intact.  Cerebellar testing: No dysmetria or intention tremor. There is no truncal or gait ataxia.  Sensory exam: intact to light touch in the upper and lower extremities.  Gait, station and balance: She stands easily. No veering to one side is noted. No leaning to one side is noted. Posture is age-appropriate and stance is narrow based. Gait shows normal stride length and normal pace. No problems turning are noted. Tandem walk is unremarkable.                Assessment and plan:  In summary, Gwendolyn [name] is a very pleasant 54 y.o.-year old female with an underlying medical history of diabetes, history of TIA, PCOS, palpitations, smoking and overweight state, whose history and physical exam are concerning for obstructive sleep apnea (OSA). I had a long chat with the patient about my findings and the diagnosis of OSA, its prognosis and treatment options. We talked about medical treatments, surgical interventions and non-pharmacological approaches. I explained in particular the risks and ramifications of untreated moderate to severe OSA, especially with respect to developing cardiovascular disease down the Road, including congestive heart failure, difficult to treat hypertension, cardiac arrhythmias, or stroke. Even type 2 diabetes has, in part, been linked to untreated OSA. Symptoms of untreated OSA include daytime sleepiness, memory problems, mood irritability and mood disorder such as depression and anxiety, lack of energy, as well as recurrent headaches, especially morning headaches. We talked about smoking cessation and trying to maintain a healthy lifestyle in general, as well as the importance of weight control. We also talked about the importance of good sleep hygiene. I recommended the following at this time: sleep  study.  I explained the sleep test procedure to the patient and also outlined possible surgical and non-surgical treatment options of OSA, including the use of a custom-made dental device (which would require a referral to a specialist dentist or oral surgeon), upper airway surgical options, such as traditional UPPP or a novel less invasive surgical option in the form of Inspire hypoglossal nerve stimulation (which would involve a referral to an ENT surgeon). I also explained the CPAP treatment option to the patient,  who indicated that she would be willing to try CPAP if the need arises. I explained the importance of being compliant with PAP treatment, not only for insurance purposes but primarily to improve Her symptoms, and for the patient's long term health benefit, including to reduce Her cardiovascular risks. I answered all her questions today and the patient was in agreement. I plan to see her back after the sleep study is completed and encouraged her to call with any interim questions, concerns, problems or updates.   Thank you very much for allowing me to participate in the care of this nice patient. If I can be of any further assistance to you please do not hesitate to call me at [phone].  Sincerely,   Star Age, MD, PhD

## 2019-06-14 NOTE — Patient Instructions (Signed)

## 2019-07-18 ENCOUNTER — Ambulatory Visit (INDEPENDENT_AMBULATORY_CARE_PROVIDER_SITE_OTHER): Payer: BC Managed Care – PPO | Admitting: Neurology

## 2019-07-18 DIAGNOSIS — G4733 Obstructive sleep apnea (adult) (pediatric): Secondary | ICD-10-CM

## 2019-07-18 DIAGNOSIS — R0683 Snoring: Secondary | ICD-10-CM

## 2019-07-18 DIAGNOSIS — R0681 Apnea, not elsewhere classified: Secondary | ICD-10-CM

## 2019-07-18 DIAGNOSIS — E663 Overweight: Secondary | ICD-10-CM

## 2019-07-18 DIAGNOSIS — G4719 Other hypersomnia: Secondary | ICD-10-CM

## 2019-08-07 NOTE — Addendum Note (Signed)
Addended by: Star Age on: 08/07/2019 08:06 AM   Modules accepted: Orders

## 2019-08-07 NOTE — Progress Notes (Signed)
Patient referred by Dr. Anastasio Champion, seen by me on 06/14/19, HST on 07/18/19.    Please call and notify the patient that the recent home sleep test showed obstructive sleep apnea in the moderate range. While I recommend treatment for this in the form CPAP, her insurance will not approve a sleep study for this. They will likely only approve a trial of autoPAP, which means, that we don't have to bring her in for a sleep study with CPAP, but will let her try an autoPAP machine at home, through a DME company (of her choice, or as per insurance requirement). The DME representative will educate her on how to use the machine, how to put the mask on, etc. I have placed an order in the chart. Please send referral, talk to patient, send report to referring MD. We will need a FU in sleep clinic for 10 weeks post-PAP set up, please arrange that with me or one of our NPs. Thanks,   Star Age, MD, PhD [facility])

## 2019-08-07 NOTE — Procedures (Signed)
Patient Information     First Name: Gwendolyn Last Name: [name] ID: [id number]  Birth Date: [birth year] Age: 54 Gender: Female  Referring Provider: Doree Albee, MD BMI: 28.3 (W=176 lb, H=5' 6'')  Neck Circ.:  15 '' Epworth:  12/24   Sleep Study Information    Study Date: Jul 18, 2019 S/H/A Version: 001.001.001.001 / 4.1.1528 / 34  History:    54 year old woman with a history of diabetes, history of TIA, PCOS, palpitations, smoking and overweight state, who reports snoring and excessive daytime somnolence.  She has had witnessed apneic spells per family report.  Summary & Diagnosis:     OSA Recommendations:      This home sleep test demonstrates moderate obstructive sleep apnea with a total AHI of 15.2/hour and O2 nadir of 84%. Snoring appeared to be in the mild to moderate rare, rarely loud. Treatment with positive airway pressure (in the form of CPAP) is recommended. This will require a full night CPAP titration study for proper treatment settings, O2 monitoring and mask fitting. Based on the severity of the sleep disordered breathing an attended titration study is indicated. However, patient's insurance has denied an attended sleep study; therefore, the patient will be advised to proceed with an autoPAP titration/trial at home for now. Please note that untreated obstructive sleep apnea may carry additional perioperative morbidity. Patients with significant obstructive sleep apnea should receive perioperative PAP therapy and the surgeons and particularly the anesthesiologist should be informed of the diagnosis and the severity of the sleep disordered breathing. The patient should be cautioned not to drive, work at heights, or operate dangerous or heavy equipment when tired or sleepy. Review and reiteration of good sleep hygiene measures should be pursued with any patient. Other causes of the patient's symptoms, including circadian rhythm disturbances, an underlying mood disorder, medication effect  and/or an underlying medical problem cannot be ruled out based on this test. Clinical correlation is recommended. The patient and her referring provider will be notified of the test results. The patient will be seen in follow up in sleep clinic at [facility].  I certify that I have reviewed the raw data recording prior to the issuance of this report in accordance with the standards of the American Academy of Sleep Medicine (AASM).  Star Age, MD, PhD [facility]) Diplomat, ABPN (Neurology and Sleep)             Sleep Summary  Oxygen Saturation Statistics   Start Study Time: End Study Time: Total Recording Time:  8:30:50 PM 4:13:52 AM   7 h, 43 min  Total Sleep Time % REM of Sleep Time:  5 h, 57 min  24.2    Mean: 90 Minimum: 84 Maximum: 96  Mean of Desaturations Nadirs (%):   89  Oxygen Desaturation. %: 4-9 10-20 >20 Total  Events Number Total  35 100.0  0 0.0  0 0.0  35 100.0  Oxygen Saturation:  <90 <=88 <85 <80 <70  Duration (minutes): Sleep % 104.6 45.0 29.2 12.6 0.1 0.0 0.0 0.0 0.0 0.0     Respiratory Indices      Total Events REM NREM All Night  pRDI:  121  pAHI:  90 ODI:  35  pAHIc:  4  % CSR: 0.0 33.4 25.7 9.0 0.0 16.2 11.8 4.9 0.9 20.4 15.2 5.9 0.7       Pulse Rate Statistics during Sleep (BPM)      Mean:  77 Minimum: 55 Maximum:  114    Indices are calculated using technically valid sleep time of 5 h, 56 min. pRDI/pAHI are calculated using oxi desaturations ? 3%  Body Position Statistics  Position Supine Prone Right Left Non-Supine  Sleep (min) 43.5 115.5 5.0 193.5 314.0  Sleep % 12.2 32.3 1.4 54.1 87.8  pRDI 35.0 22.9 N/A 16.2 18.4  pAHI 28.0 10.9 N/A 15.2 13.4  ODI 15.4 3.6 N/A 5.3 4.6     Snoring Statistics Snoring Level (dB) >40 >50 >60 >70 >80 >Threshold (45)  Sleep (min) 226.6 59.0 3.1 0.0 0.0 129.6  Sleep % 63.4 16.5 0.9 0.0 0.0 36.2    Mean: 45 dB Sleep Stages Chart                        pAHI=15.2                                   Mild              Moderate                    Severe                                                 5              15                    30

## 2019-08-09 ENCOUNTER — Telehealth: Payer: Self-pay

## 2019-08-09 NOTE — Telephone Encounter (Signed)
See my chart message from 08/09/2019.

## 2019-08-09 NOTE — Telephone Encounter (Signed)
-----   Message from Star Age, MD sent at 08/07/2019  8:06 AM EDT ----- Patient referred by Dr. Anastasio Champion, seen by me on 06/14/19, HST on 07/18/19.    Please call and notify the patient that the recent home sleep test showed obstructive sleep apnea in the moderate range. While I recommend treatment for this in the form CPAP, her insurance will not approve a sleep study for this. They will likely only approve a trial of autoPAP, which means, that we don't have to bring her in for a sleep study with CPAP, but will let her try an autoPAP machine at home, through a DME company (of her choice, or as per insurance requirement). The DME representative will educate her on how to use the machine, how to put the mask on, etc. I have placed an order in the chart. Please send referral, talk to patient, send report to referring MD. We will need a FU in sleep clinic for 10 weeks post-PAP set up, please arrange that with me or one of our NPs. Thanks,   Star Age, MD, PhD [facility])

## 2019-09-03 ENCOUNTER — Other Ambulatory Visit: Payer: Self-pay

## 2019-09-03 ENCOUNTER — Encounter (INDEPENDENT_AMBULATORY_CARE_PROVIDER_SITE_OTHER): Payer: Self-pay | Admitting: Internal Medicine

## 2019-09-03 ENCOUNTER — Ambulatory Visit (INDEPENDENT_AMBULATORY_CARE_PROVIDER_SITE_OTHER): Payer: BC Managed Care – PPO | Admitting: Internal Medicine

## 2019-09-03 VITALS — BP 140/80 | HR 73 | Temp 97.2°F | Resp 18 | Ht 66.0 in | Wt 177.2 lb

## 2019-09-03 DIAGNOSIS — R079 Chest pain, unspecified: Secondary | ICD-10-CM

## 2019-09-03 DIAGNOSIS — E2839 Other primary ovarian failure: Secondary | ICD-10-CM | POA: Diagnosis not present

## 2019-09-03 DIAGNOSIS — F17200 Nicotine dependence, unspecified, uncomplicated: Secondary | ICD-10-CM

## 2019-09-03 DIAGNOSIS — Z0001 Encounter for general adult medical examination with abnormal findings: Secondary | ICD-10-CM | POA: Diagnosis not present

## 2019-09-03 DIAGNOSIS — E559 Vitamin D deficiency, unspecified: Secondary | ICD-10-CM

## 2019-09-03 DIAGNOSIS — E119 Type 2 diabetes mellitus without complications: Secondary | ICD-10-CM

## 2019-09-03 DIAGNOSIS — J4521 Mild intermittent asthma with (acute) exacerbation: Secondary | ICD-10-CM

## 2019-09-03 DIAGNOSIS — E282 Polycystic ovarian syndrome: Secondary | ICD-10-CM | POA: Diagnosis not present

## 2019-09-03 MED ORDER — ESTRADIOL 0.5 MG PO TABS: 0.5000 mg | ORAL_TABLET | Freq: Every day | ORAL | 1 refills | Status: DC

## 2019-09-03 MED ORDER — PROGESTERONE 200 MG PO CAPS: 400.0000 mg | ORAL_CAPSULE | Freq: Every day | ORAL | 1 refills | Status: DC

## 2019-09-03 MED ORDER — CHANTIX STARTING MONTH PAK 0.5 MG X 11 & 1 MG X 42 PO TABS: ORAL_TABLET | ORAL | 0 refills | Status: DC

## 2019-09-03 MED ORDER — METFORMIN HCL 500 MG PO TABS: 500.0000 mg | ORAL_TABLET | Freq: Two times a day (BID) | ORAL | 1 refills | Status: DC

## 2019-09-03 MED ORDER — ALBUTEROL SULFATE HFA 108 (90 BASE) MCG/ACT IN AERS: 2.0000 | INHALATION_SPRAY | Freq: Four times a day (QID) | RESPIRATORY_TRACT | 6 refills | Status: DC | PRN

## 2019-09-03 NOTE — Progress Notes (Addendum)
Chief Complaint: This 54 year old lady comes in for an annual physical exam and to address her chronic conditions which are described below. HPI: This patient has diabetes and takes Metformin.  Her last hemoglobin A1c was 9.2%, uncontrolled diabetes. Unfortunately, she still continues to smoke cigarettes, approximately 5 to 10 cigarettes a day and she really wishes to quit now.  She tells me that she has had difficulty in breathing but more importantly in the last 6 weeks or so, she appears to have had chest pain.  The chest pain is sharp initially but then is a dull discomfort which can last up to 10 minutes.  It does not radiate.  It can come on at rest. She is also postmenopausal and has symptoms related in this and takes bioidentical hormone therapy. She also has PCOS which we are trying to work on with nutrition.  Past Medical History:  Diagnosis Date  . Diabetes mellitus without complication ([place])   . History of TIAs   . Palpitations   . Polycystic ovarian disease   . Tobacco abuse    Past Surgical History:  Procedure Laterality Date  . TUBAL LIGATION       Social History   Social History Narrative   Exercises rarely. Smokes tobacco. Works in a Cytogeneticist. Lives in [place]. Eats meat fruit and vegetables. Married for 70 years,lives with husband.    Social History   Tobacco Use  . Smoking status: Current Every Day Smoker    Packs/day: 0.25    Years: 20.00    Pack years: 5.00  . Smokeless tobacco: Never Used  Substance Use Topics  . Alcohol use: Yes    Alcohol/week: 14.0 standard drinks    Types: 14 Glasses of wine per week      Allergies:  Allergies  Allergen Reactions  . Lidocaine Anaphylaxis, Other (See Comments) and Nausea And Vomiting  . Other Hives and Rash    SEA MOSS     Current Meds  Medication Sig  . albuterol (PROAIR HFA) 108 (90 Base) MCG/ACT inhaler Inhale 2 puffs into the lungs every 6  (six) hours as needed.  . Cholecalciferol (VITAMIN D3) 125 MCG (5000 UT) TABS Take 2 tablets by mouth daily.  [name] estradiol (ESTRACE) 0.5 MG tablet Take 1 tablet (0.5 mg total) by mouth daily.  [name] LINZESS 72 MCG capsule Take 1 capsule by mouth daily at 12 noon.   . metFORMIN (GLUCOPHAGE) 500 MG tablet Take 1 tablet (500 mg total) by mouth 2 (two) times daily with a meal.  . progesterone (PROMETRIUM) 200 MG capsule Take 2 capsules (400 mg total) by mouth daily.  . [DISCONTINUED] albuterol (PROAIR HFA) 108 (90 Base) MCG/ACT inhaler Inhale 2 puffs into the lungs every 6 (six) hours as needed.  . [DISCONTINUED] estradiol (ESTRACE) 0.5 MG tablet Take 1 tablet (0.5 mg total) by mouth daily.  . [DISCONTINUED] metFORMIN (GLUCOPHAGE) 500 MG tablet Take 1 tablet (500 mg total) by mouth 2 (two) times daily with a meal.  . [DISCONTINUED] progesterone (PROMETRIUM) 200 MG capsule Take 2 capsules (400 mg total) by mouth daily.      Depression screen [facility] 2/9 09/03/2019 11/19/2016  Decreased Interest 0 0  Down, Depressed, Hopeless 0 0  PHQ - 2 Score 0 0     PYP:PJKDT from the symptoms mentioned above,there are no other symptoms referable to all systems reviewed.       Physical Exam: Blood pressure 140/80, pulse 73, temperature (!) 97.2  F (36.2 C), temperature source Temporal, resp. rate 18, height 5\' 6"  (1.676 m), weight 177 lb 3.2 oz (80.4 kg), last menstrual period 03/30/2011, SpO2 98 %. Vitals with BMI 09/03/2019 06/14/2019 06/04/2019  Height 5\' 6"  5\' 6"  5\' 6"   Weight 177 lbs 3 oz 176 lbs 176 lbs  BMI 28.61 51.88 41.66  Systolic 063 016 010  Diastolic 80 80 80  Pulse 73 82 84      She looks systemically well and does not appear to be in pain.  She is overweight.  Blood pressure reasonable. General: Alert, cooperative, and appears to be the stated age.No pallor.  No jaundice.  No clubbing. Head: Normocephalic Eyes: Sclera white, pupils equal and reactive to light, red reflex x 2,  Ears: Normal  bilaterally Oral cavity: Lips, mucosa, and tongue normal: Teeth and gums normal Neck: No adenopathy, supple, symmetrical, trachea midline, and thyroid does not appear enlarged Respiratory: Clear to auscultation bilaterally.No wheezing, crackles or bronchial breathing. Cardiovascular: Heart sounds are present and appear to be normal without murmurs or added sounds.  No carotid bruits.  Peripheral pulses are present and equal bilaterally.: Gastrointestinal:positive bowel sounds, no hepatosplenomegaly.  No masses felt.No tenderness. Skin: Clear, No rashes noted.No worrisome skin lesions seen. Neurological: Grossly intact without focal findings, cranial nerves II through XII intact, muscle strength equal bilaterally Musculoskeletal: No acute joint abnormalities noted.Full range of movement noted with joints. Psychiatric: Affect appropriate, non-anxious.    Assessment  1. Encounter for general adult medical examination with abnormal findings   2. Type 2 diabetes mellitus without complication, without long-term current use of insulin ([place])   3. PCOS (polycystic ovarian syndrome)   4. Female hypogonadism syndrome   5. Vitamin D deficiency disease   6. TOBACCO ABUSE   7. Chest pain, unspecified type   8. Mild intermittent asthma with acute exacerbation     Tests Ordered:   Orders Placed This Encounter  Procedures  . CBC  . COMPLETE METABOLIC PANEL WITH GFR  . Hemoglobin A1c  . Progesterone  . Estradiol  . VITAMIN D 25 Hydroxy (Vit-D Deficiency, Fractures)  . T3, free  . T4  . TSH  . Cardio IQ Adv Lipid and Inflamm Pnl  . Ambulatory referral to Cardiology     Plan  48. 54 year old lady with PCOS, diabetes and now chest pain in a smoker. 2. An ECG was performed in the office which shows normal sinus rhythm with somewhat poor progression of R waves and I am somewhat concerned about V4.  I think this ECG is abnormal enough that together with her symptoms, I am going to refer to  cardiology as soon as possible for further evaluation. 3. In terms of her tobacco cessation, I will prescribe Chantix for her which she has tolerated in the past without any suicidal ideation. 4. Blood work is ordered above. 5. She will continue with Metformin for diabetes and we will see what the A1c is.  I suspect we will need to work on diet intensively. 6. She will continue with estradiol and progesterone and I will check those levels also. 7. Further recommendations will depend on blood results and I will see her for follow-up in 6 weeks. 8. Today, in addition to a preventative visit, I performed an office visit to address her chronic conditions and symptoms of chest pain.     Meds ordered this encounter  Medications  . varenicline (CHANTIX STARTING MONTH PAK) 0.5 MG X 11 & 1 MG X 42 tablet  Sig: Take one 0.5 mg tablet by mouth once daily for 3 days, then increase to one 0.5 mg tablet twice daily for 4 days, then increase to one 1 mg tablet twice daily.    Dispense:  53 tablet    Refill:  0  . estradiol (ESTRACE) 0.5 MG tablet    Sig: Take 1 tablet (0.5 mg total) by mouth daily.    Dispense:  90 tablet    Refill:  1  . metFORMIN (GLUCOPHAGE) 500 MG tablet    Sig: Take 1 tablet (500 mg total) by mouth 2 (two) times daily with a meal.    Dispense:  180 tablet    Refill:  1  . progesterone (PROMETRIUM) 200 MG capsule    Sig: Take 2 capsules (400 mg total) by mouth daily.    Dispense:  180 capsule    Refill:  1  . albuterol (PROAIR HFA) 108 (90 Base) MCG/ACT inhaler    Sig: Inhale 2 puffs into the lungs every 6 (six) hours as needed.    Dispense:  18 g    Refill:  6     Jenae Tomasello C Franke Menter   09/03/2019, 2:54 PM

## 2019-09-03 NOTE — Addendum Note (Signed)
Addended by: Doree Albee on: 09/03/2019 02:54 PM   Modules accepted: Orders

## 2019-09-04 LAB — HEMOGLOBIN A1C
Hgb A1c MFr Bld: 7.4 % of total Hgb — ABNORMAL HIGH (ref <5.7)
Mean Plasma Glucose: 166 (calc)
eAG (mmol/L): 9.2 (calc)

## 2019-09-04 LAB — COMPLETE METABOLIC PANEL WITH GFR
AG Ratio: 2 (calc) (ref 1.0–2.5)
ALT: 25 U/L (ref 6–29)
AST: 14 U/L (ref 10–35)
Albumin: 4.5 g/dL (ref 3.6–5.1)
Alkaline phosphatase (APISO): 51 U/L (ref 37–153)
BUN/Creatinine Ratio: 29 (calc) — ABNORMAL HIGH (ref 6–22)
BUN: 14 mg/dL (ref 7–25)
CO2: 27 mmol/L (ref 20–32)
Calcium: 9.5 mg/dL (ref 8.6–10.4)
Chloride: 103 mmol/L (ref 98–110)
Creat: 0.48 mg/dL — ABNORMAL LOW (ref 0.50–1.05)
GFR, Est African American: 131 mL/min/{1.73_m2} (ref >=60)
GFR, Est Non African American: 113 mL/min/{1.73_m2} (ref >=60)
Globulin: 2.3 g/dL (calc) (ref 1.9–3.7)
Glucose, Bld: 114 mg/dL — ABNORMAL HIGH (ref 65–99)
Potassium: 4.2 mmol/L (ref 3.5–5.3)
Sodium: 138 mmol/L (ref 135–146)
Total Bilirubin: 0.3 mg/dL (ref 0.2–1.2)
Total Protein: 6.8 g/dL (ref 6.1–8.1)

## 2019-09-04 LAB — PROGESTERONE: Progesterone: 10.6 ng/mL

## 2019-09-04 LAB — CBC
HCT: 41.5 % (ref 35.0–45.0)
Hemoglobin: 14.2 g/dL (ref 11.7–15.5)
MCH: 32.6 pg (ref 27.0–33.0)
MCHC: 34.2 g/dL (ref 32.0–36.0)
MCV: 95.4 fL (ref 80.0–100.0)
MPV: 10.2 fL (ref 7.5–12.5)
Platelets: 235 10*3/uL (ref 140–400)
RBC: 4.35 10*6/uL (ref 3.80–5.10)
RDW: 13 % (ref 11.0–15.0)
WBC: 6.7 10*3/uL (ref 3.8–10.8)

## 2019-09-04 LAB — VITAMIN D 25 HYDROXY (VIT D DEFICIENCY, FRACTURES): Vit D, 25-Hydroxy: 69 ng/mL (ref 30–100)

## 2019-09-04 LAB — ESTRADIOL: Estradiol: 24 pg/mL

## 2019-09-04 LAB — T3, FREE: T3, Free: 3.4 pg/mL (ref 2.3–4.2)

## 2019-09-04 LAB — T4: T4, Total: 6.1 ug/dL (ref 5.1–11.9)

## 2019-09-04 LAB — TSH: TSH: 1.2 mIU/L

## 2019-09-05 ENCOUNTER — Encounter: Payer: Self-pay | Admitting: Cardiovascular Disease

## 2019-09-05 ENCOUNTER — Ambulatory Visit: Payer: BC Managed Care – PPO | Admitting: Cardiovascular Disease

## 2019-09-05 ENCOUNTER — Other Ambulatory Visit: Payer: Self-pay

## 2019-09-05 VITALS — BP 140/82 | HR 76 | Ht 66.0 in | Wt 175.0 lb

## 2019-09-05 DIAGNOSIS — R079 Chest pain, unspecified: Secondary | ICD-10-CM | POA: Diagnosis not present

## 2019-09-05 DIAGNOSIS — R0600 Dyspnea, unspecified: Secondary | ICD-10-CM

## 2019-09-05 NOTE — Patient Instructions (Signed)
Medication Instructions:  Your physician recommends that you continue on your current medications as directed. Please refer to the Current Medication list given to you today.  *If you need a refill on your cardiac medications before your next appointment, please call your pharmacy*   Lab Work: None today If you have labs (blood work) drawn today and your tests are completely normal, you will receive your results only by: [name] MyChart Message (if you have MyChart) OR . A paper copy in the mail If you have any lab test that is abnormal or we need to change your treatment, we will call you to review the results.   Testing/Procedures: Your physician has requested that you have an echocardiogram. Echocardiography is a painless test that uses sound waves to create images of your heart. It provides your doctor with information about the size and shape of your heart and how well your heart's chambers and valves are working. This procedure takes approximately one hour. There are no restrictions for this procedure.  Your physician has requested that you have en exercise stress myoview. For further information please visit [email or web address]. Please follow instruction sheet, as given.    Follow-Up: At [facility], you and your health needs are our priority.  As part of our continuing mission to provide you with exceptional heart care, we have created designated Provider Care Teams.  These Care Teams include your primary Cardiologist (physician) and Advanced Practice Providers (APPs -  Physician Assistants and Nurse Practitioners) who all work together to provide you with the care you need, when you need it.  We recommend signing up for the patient portal called "MyChart".  Sign up information is provided on this After Visit Summary.  MyChart is used to connect with patients for Virtual Visits (Telemedicine).  Patients are able to view lab/test results, encounter notes, upcoming appointments, etc.   Non-urgent messages can be sent to your provider as well.   To learn more about what you can do with MyChart, go to [email or web address].    Your next appointment:  We will call you with results of test.Follow up as needed.      Thank you for choosing [facility] !

## 2019-09-05 NOTE — Progress Notes (Signed)
CARDIOLOGY CONSULT NOTE       Patient ID: Gwendolyn [name] MRN: [id number] DOB/AGE: 1965-11-26 54 y.o.  Referring Physician: Anastasio [name] Primary Physician: Gwendolyn Albee, MD Primary Cardiologist: New Reason for Consultation: Chest Pain/ Dyspnea   Active Problems:   * No active hospital problems. *   HPI:  54 y.o. with poorly controlled DM, smoking, post menopausal with PCOS referred by Dr Gwendolyn [name] for chest pain and dyspnea Dyspnea is exertional has had it for a while likely component of emphysema. SSCP Seen by DR Percival Spanish in 2012 with palpitations and dyspnea had a normal TTE 05/01/10 EF 55-60%   Pain is atypical sharp stabbing and not related to exertion. Some job stress doing operations work  Has two older children not at home and husband all in good shape  She finds it hard to have low carb diet and A1c>7  Dyspnea is chronic and she does have an inhaler she uses from time to time   ROS All other systems reviewed and negative except as noted above  Past Medical History:  Diagnosis Date  . Diabetes mellitus without complication ([place])   . History of TIAs   . Palpitations   . Polycystic ovarian disease   . Tobacco abuse     Family History  Problem Relation Age of Onset  . Hypertension Mother   . Diabetes Father   . Diabetes Brother     Social History   Socioeconomic History  . Marital status: Married    Spouse name: Not on file  . Number of children: Not on file  . Years of education: Not on file  . Highest education level: Not on file  Occupational History  . Not on file  Tobacco Use  . Smoking status: Current Every Day Smoker    Packs/day: 0.25    Years: 20.00    Pack years: 5.00  . Smokeless tobacco: Never Used  Vaping Use  . Vaping Use: Former  Substance and Sexual Activity  . Alcohol use: Yes    Alcohol/week: 14.0 standard drinks    Types: 14 Glasses of wine per week  . Drug use: No  . Sexual activity: Not Currently    Birth control/protection:  Post-menopausal  Other Topics Concern  . Not on file  Social History Narrative   Exercises rarely. Smokes tobacco. 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 Eats meat fruit and vegetables. Married for 85 years,lives with husband.   Social Determinants of Health   Financial Resource Strain:   . Difficulty of Paying Living Expenses:   Food Insecurity:   . Worried About Charity fundraiser in the Last Year:   . Arboriculturist in the Last Year:   Transportation Needs:   . Film/video editor (Medical):   [name] Lack of Transportation (Non-Medical):   Physical Activity:   . Days of Exercise per Week:   . Minutes of Exercise per Session:   Stress:   . Feeling of Stress :   Social Connections:   . Frequency of Communication with Friends and Family:   . Frequency of Social Gatherings with Friends and Family:   . Attends Religious Services:   . Active Member of Clubs or Organizations:   . Attends Archivist Meetings:   [name] Marital Status:   Intimate Partner Violence:   . Fear of Current or Ex-Partner:   . Emotionally Abused:   [name] Physically Abused:   . Sexually Abused:  Past Surgical History:  Procedure Laterality Date  . TUBAL LIGATION        Current Outpatient Medications:  .  albuterol (PROAIR HFA) 108 (90 Base) MCG/ACT inhaler, Inhale 2 puffs into the lungs every 6 (six) hours as needed., Disp: 18 g, Rfl: 6 .  Cholecalciferol (VITAMIN D3) 125 MCG (5000 UT) TABS, Take 2 tablets by mouth daily., Disp: , Rfl:  .  estradiol (ESTRACE) 0.5 MG tablet, Take 1 tablet (0.5 mg total) by mouth daily., Disp: 90 tablet, Rfl: 1 .  LINZESS 72 MCG capsule, Take 1 capsule by mouth daily at 12 noon. , Disp: , Rfl:  .  metFORMIN (GLUCOPHAGE) 500 MG tablet, Take 1 tablet (500 mg total) by mouth 2 (two) times daily with a meal., Disp: 180 tablet, Rfl: 1 .  progesterone (PROMETRIUM) 200 MG capsule, Take 2 capsules (400 mg total) by  mouth daily., Disp: 180 capsule, Rfl: 1 .  varenicline (CHANTIX STARTING MONTH PAK) 0.5 MG X 11 & 1 MG X 42 tablet, Take one 0.5 mg tablet by mouth once daily for 3 days, then increase to one 0.5 mg tablet twice daily for 4 days, then increase to one 1 mg tablet twice daily., Disp: 53 tablet, Rfl: 0    Physical Exam: Blood pressure 140/82, pulse 76, height 5\' 6"  (1.676 m), weight 175 lb (79.4 kg), last menstrual period 03/30/2011, SpO2 94 %.    Affect appropriate Healthy:  appears stated age 50: normal Neck supple with no adenopathy JVP normal no bruits no thyromegaly Lungs diffuse wheezing  Heart:  S1/S2 no murmur, no rub, gallop or click PMI normal Abdomen: benighn, BS positve, no tenderness, no AAA no bruit.  No HSM or HJR Distal pulses intact with no bruits No edema Neuro non-focal Skin warm and dry No muscular weakness   Labs:   Lab Results  Component Value Date   WBC 6.7 09/03/2019   HGB 14.2 09/03/2019   HCT 41.5 09/03/2019   MCV 95.4 09/03/2019   PLT 235 09/03/2019    Recent Labs  Lab 09/03/19 1436  NA 138  K 4.2  CL 103  CO2 27  BUN 14  CREATININE 0.48*  CALCIUM 9.5  PROT 6.8  BILITOT 0.3  ALT 25  AST 14  GLUCOSE 114*      Radiology: No results found.  EKG: ECG done in primary office 09/03/19 poor R wave progression    ASSESSMENT AND PLAN:   1. Chest Pain : atypical history of normal myovue in 2012 ECG with poor R wave progression will update exercise myovue study given her DM discussed having stress testing every 3-4 years  2  Smoking:  Now on Chantix counseled for < 10 minutes Dyspnea related to this with some asthmatic component as she was wheezy in clinic today Albuterol PRN consider f/u with primary for maintenance inhaler Will also update echo to r/o cardiac etiology to dyspnea and r/o RWMA with poor R wave progression on ECG  3. DM:  Discussed low carb diet.  Target hemoglobin A1c is 6.5 or less.  Continue current medications. 4. PCOS:   On hormone replacement levels followed by Dr Gwendolyn [name]   Signed: [name] 09/05/2019, 3:08 PM

## 2019-09-09 LAB — CARDIO IQ ADV LIPID AND INFLAMM PNL
Apolipoprotein B: 77 mg/dL (ref <90)
Cholesterol: 165 mg/dL (ref <200)
HDL: 67 mg/dL (ref >49)
LDL Cholesterol (Calc): 79 mg/dL (calc) (ref <100)
LDL Large: 6818 nmol/L (ref >6729)
LDL Medium: 269 nmol/L — ABNORMAL HIGH (ref <215)
LDL Particle Number: 1451 nmol/L — ABNORMAL HIGH (ref <1138)
LDL Peak Size: 221.1 Angstrom — ABNORMAL LOW (ref >222.9)
LDL Small: 247 nmol/L — ABNORMAL HIGH (ref <142)
Lipoprotein (a): 13 nmol/L (ref <75)
Non-HDL Cholesterol (Calc): 98 mg/dL (calc) (ref <130)
PLAC: 80 nmol/min/mL (ref <124)
Total CHOL/HDL Ratio: 2.5 calc (ref <3.6)
Triglycerides: 107 mg/dL (ref <150)
hs-CRP: 0.9 mg/L (ref <1.0)

## 2019-09-10 ENCOUNTER — Telehealth: Payer: Self-pay

## 2019-09-10 NOTE — Telephone Encounter (Signed)
Received message from [employer] stating  Just letting you know that this patient was called on 6/29 LVM, 7/15 patient said that she would like to talk to her dr before she decides on anything, 7/21 LVM, 7/26 spoke to patient and she said that she talked to the DR and that she is just going to hold off on getting setup. I have voided this order.

## 2019-09-12 NOTE — Telephone Encounter (Signed)
Please advise patient that her sleep apnea was deemed in the moderate range, for which treatment is recommended.  I would like to see if she would like a referral to a dentist for treatment with an oral appliance.  We can also consult with an ENT surgeon for surgical options if she wishes.  Please let me know if she would like a referral to dentistry and place an order if she agrees.

## 2019-09-12 NOTE — Telephone Encounter (Signed)
Called pt. Relayed Dr. Guadelupe Sabin message and recommendations. Pt was at work currently. She wants to think about options. She will call back on how she wants to proceed.

## 2019-09-19 ENCOUNTER — Encounter (HOSPITAL_BASED_OUTPATIENT_CLINIC_OR_DEPARTMENT_OTHER)
Admission: RE | Admit: 2019-09-19 | Discharge: 2019-09-19 | Disposition: A | Discharge status: Home / Self Care | Payer: BC Managed Care – PPO | Source: Ambulatory Visit | Attending: Cardiovascular Disease | Admitting: Cardiovascular Disease

## 2019-09-19 ENCOUNTER — Other Ambulatory Visit: Payer: Self-pay

## 2019-09-19 ENCOUNTER — Ambulatory Visit (HOSPITAL_COMMUNITY)
Admission: RE | Admit: 2019-09-19 | Discharge: 2019-09-19 | Disposition: A | Discharge status: Home / Self Care | Payer: BC Managed Care – PPO | Source: Ambulatory Visit | Attending: Cardiovascular Disease | Admitting: Cardiovascular Disease

## 2019-09-19 ENCOUNTER — Encounter (INDEPENDENT_AMBULATORY_CARE_PROVIDER_SITE_OTHER): Payer: Self-pay | Admitting: Internal Medicine

## 2019-09-19 ENCOUNTER — Encounter (HOSPITAL_COMMUNITY)
Admission: RE | Admit: 2019-09-19 | Discharge: 2019-09-19 | Disposition: A | Discharge status: Home / Self Care | Payer: BC Managed Care – PPO | Source: Ambulatory Visit | Attending: Cardiovascular Disease | Admitting: Cardiovascular Disease

## 2019-09-19 DIAGNOSIS — R079 Chest pain, unspecified: Secondary | ICD-10-CM

## 2019-09-19 DIAGNOSIS — R0600 Dyspnea, unspecified: Secondary | ICD-10-CM | POA: Insufficient documentation

## 2019-09-19 LAB — ECHOCARDIOGRAM COMPLETE
AR max vel: 1.52 cm2
AV Area VTI: 1.65 cm2
AV Area mean vel: 1.64 cm2
AV Mean grad: 5.4 mmHg
AV Peak grad: 11 mmHg
Ao pk vel: 1.66 m/s
Area-P 1/2: 3.31 cm2
S' Lateral: 2.75 cm

## 2019-09-19 LAB — NM MYOCAR MULTI W/SPECT W/WALL MOTION / EF
Estimated workload: 7 METS
Exercise duration (min): 5 min
Exercise duration (sec): 24 s
LV dias vol: 83 mL (ref 46–106)
LV sys vol: 24 mL
MPHR: 168 {beats}/min
Peak HR: 148 {beats}/min
Percent HR: 88 %
RATE: 0.31
RPE: 17
Rest HR: 71 {beats}/min
SDS: 2
SRS: 0
SSS: 2
TID: 0.98

## 2019-09-19 MED ORDER — TECHNETIUM TC 99M TETROFOSMIN IV KIT
10.0000 | PACK | Freq: Once | INTRAVENOUS | Status: AC | PRN
  Administered 2019-09-19: 10 via INTRAVENOUS

## 2019-09-19 MED ORDER — TECHNETIUM TC 99M TETROFOSMIN IV KIT
30.0000 | PACK | Freq: Once | INTRAVENOUS | Status: AC | PRN
  Administered 2019-09-19: 30 via INTRAVENOUS

## 2019-09-19 MED ORDER — REGADENOSON 0.4 MG/5ML IV SOLN
INTRAVENOUS | Status: AC
  Filled 2019-09-19: qty 5

## 2019-09-19 MED ORDER — SODIUM CHLORIDE FLUSH 0.9 % IV SOLN
INTRAVENOUS | Status: AC
  Filled 2019-09-19: qty 10

## 2019-09-19 NOTE — Progress Notes (Signed)
*  PRELIMINARY RESULTS* Echocardiogram 2D Echocardiogram has been performed.  Gwendolyn [name] 09/19/2019, 2:16 PM

## 2019-09-19 NOTE — Progress Notes (Signed)
I.V. removed at approximately 1343 from left antecubital. Site dry, no redness with pressure dressing in place.  Alvino Chapel, RCS

## 2019-09-24 ENCOUNTER — Telehealth: Payer: Self-pay | Admitting: Cardiovascular Disease

## 2019-09-24 NOTE — Telephone Encounter (Signed)
Pt notified of echo and stress test results.

## 2019-09-24 NOTE — Telephone Encounter (Signed)
Pt requesting test results   Please call [phone]   Thanks renee

## 2019-09-25 ENCOUNTER — Other Ambulatory Visit: Payer: Self-pay

## 2019-09-25 ENCOUNTER — Telehealth: Payer: Self-pay | Admitting: Cardiovascular Disease

## 2019-09-25 ENCOUNTER — Encounter: Payer: Self-pay | Admitting: Cardiovascular Disease

## 2019-09-25 ENCOUNTER — Ambulatory Visit: Payer: BC Managed Care – PPO | Admitting: Cardiovascular Disease

## 2019-09-25 VITALS — BP 136/84 | Ht 66.0 in | Wt 175.0 lb

## 2019-09-25 DIAGNOSIS — R079 Chest pain, unspecified: Secondary | ICD-10-CM | POA: Diagnosis not present

## 2019-09-25 DIAGNOSIS — I209 Angina pectoris, unspecified: Secondary | ICD-10-CM | POA: Diagnosis not present

## 2019-09-25 DIAGNOSIS — I259 Chronic ischemic heart disease, unspecified: Secondary | ICD-10-CM | POA: Diagnosis not present

## 2019-09-25 MED ORDER — PREDNISONE 20 MG PO TABS: ORAL_TABLET | ORAL | 0 refills | Status: DC

## 2019-09-25 MED ORDER — METOPROLOL TARTRATE 50 MG PO TABS: ORAL_TABLET | ORAL | 0 refills | Status: DC

## 2019-09-25 MED ORDER — NITROGLYCERIN 0.4 MG SL SUBL: 0.4000 mg | SUBLINGUAL_TABLET | SUBLINGUAL | 3 refills | Status: AC | PRN

## 2019-09-25 NOTE — Telephone Encounter (Signed)
Pt in office for appt today. Cardiac CT ordered.

## 2019-09-25 NOTE — Telephone Encounter (Signed)
NEW MESSAGE    Pt c/o of Chest Pain: STAT if CP now or developed within 24 hours  1. Are you having CP right now? NO  2. Are you experiencing any other symptoms (ex. SOB, nausea, vomiting, sweating)? NO  3. How long have you been experiencing CP? HAD SEVERE CHESTPAIN YESTERDAY COULD NOT SIT BACK UP AFTERWARD 4. Is your CP continuous or coming and going? LASTED ABOUT 5 MINUTES   5. Have you taken Nitroglycerin? NO ?

## 2019-09-25 NOTE — Telephone Encounter (Signed)
Spoke with pt who states that she left he wallet in the basket at the store or yesterday and left store. Pt states that she then returned to get wallet was still there.  Pt states that she was stressed from that. Later that night she was using the restroom and started to have a sharp pain in her chest. Pt states that she was bent over while sitting using the restroom for 5 mins. Pt denies bearing down having a BM at that time. She denies feeling dizzy or passing out. No other complaints at this time. Pt has an appt today. Please advise.

## 2019-09-25 NOTE — Addendum Note (Signed)
Addended by: Barbarann Ehlers A on: 09/25/2019 02:49 PM   Modules accepted: Orders

## 2019-09-25 NOTE — Patient Instructions (Signed)
Your cardiac CT will be scheduled at one of the below locations:   [facility] [address] [place], [place] [address] [phone]  [place] [address] [place], [place] [address] [phone]  If scheduled at [facility], please arrive at the [facility] main entrance of [facility] 30 minutes prior to test start time. Proceed to the [facility] Radiology Department (first floor) to check-in and test prep.  If scheduled at [facility], please arrive 15 mins early for check-in and test prep.  Please follow these instructions carefully (unless otherwise directed):    On the Night Before the Test: . Be sure to Drink plenty of water. . Do not consume any caffeinated/decaffeinated beverages or chocolate 12 hours prior to your test. . Do not take any antihistamines 12 hours prior to your test. . If the patient has contrast allergy:   Per Dr.Nishan:   Take Benadryl 25 mg the nite before the procedure AND also 1 hour before procedure  Take Prednisone 60 mg the nite before the procedure AND also 1 hour prior to procedure  .  [name] Patient will need a ride after test due to Benadryl.  On the Day of the Test: . Drink plenty of water. Do not drink any water within one hour of the test. . Do not eat any food 4 hours prior to the test. . You may take your regular medications prior to the test.  . Take metoprolol 50 mg (Lopressor) two hours prior to test. .  . FEMALES- please wear underwire-free bra if available        After the Test: . Drink plenty of water. . After receiving IV contrast, you may experience a mild flushed feeling. This is normal. . On occasion, you may experience a mild rash up to 24 hours after the test. This is not dangerous. If this occurs, you can take Benadryl 25 mg and increase your fluid intake. . If you experience trouble breathing, this can be  serious. If it is severe call 911 IMMEDIATELY. If it is mild, please call our office. . If you take any of these medications: Glipizide/Metformin, Avandament, Glucavance, please do not take 48 hours after completing test unless otherwise instructed.   Once we have confirmed authorization from your insurance company, we will call you to set up a date and time for your test. Based on how quickly your insurance processes prior authorizations requests, please allow up to 4 weeks to be contacted for scheduling your Cardiac CT appointment. Be advised that routine Cardiac CT appointments could be scheduled as many as 8 weeks after your provider has ordered it.  For non-scheduling related questions, please contact the cardiac imaging nurse navigator should you have any questions/concerns: Marchia Bond, Cardiac Imaging Nurse Navigator Burley Saver, Interim Cardiac Imaging Nurse [facility] Direct Office Dial: [phone]   For scheduling needs, including cancellations and rescheduling, please call Vivien Rota at [phone], option 3.

## 2019-09-25 NOTE — Progress Notes (Signed)
CARDIOLOGY CONSULT NOTE       Patient ID: Gwendolyn [name] MRN: [id number] DOB/AGE: 52-Jun-1967 52 y.o.  Referring Physician: Anastasio Champion Primary Physician: Doree Albee, MD Primary Cardiologist: New Reason for Consultation: Chest Pain/ Dyspnea   Active Problems:   * No active hospital problems. *   HPI:  54 y.o. with poorly controlled DM, smoking, post menopausal with PCOS referred by Dr Anastasio Champion initially on 09/05/19 for chest pain and dyspnea Dyspnea is exertional has had it for a while likely component of emphysema. SSCP Seen by DR Percival Spanish in 2012 with palpitations and dyspnea had a normal TTE 05/01/10 EF 55-60%   Pain is atypical sharp stabbing and not related to exertion. Some job stress doing operations work  Has two older children not at home and husband all in good shape  She finds it hard to have low carb diet and A1c>7  Dyspnea is chronic and she does have an inhaler she uses from time to time   Echo 09/19/19 reviewed and normal with no valve disease EF 60-65%  ETT 09/19/19 also normal max HR only 148 bpm 85% PMHR 7 METS   Called office 09/25/19 with complaint of chest pain.  Left her wallet at store and returned to get it Stressed Latter that night using bathroom had sharp pain in chest Was bent over using rest room for 5 minutes   She needs script for nitro.Given ongoing SSCP and normal ETT discussed further w/u strategies Favor cardiac CTA. She indicates having contrast before and "stopped" breathing for an instant Sounded like anxiety. No rash, wheezing or edema. She also indicates possible reaction to Sea moss which has iodine in it. Discussed pre medication for CTA with lopressor, benadryl and prednisone    ROS All other systems reviewed and negative except as noted above  Past Medical History:  Diagnosis Date  . Diabetes mellitus without complication ([place])   . History of TIAs   . Palpitations   . Polycystic ovarian disease   . Tobacco abuse     Family History   Problem Relation Age of Onset  . Hypertension Mother   . Diabetes Father   . Diabetes Brother     Social History   Socioeconomic History  . Marital status: Married    Spouse name: Not on file  . Number of children: Not on file  . Years of education: Not on file  . Highest education level: Not on file  Occupational History  . Not on file  Tobacco Use  . Smoking status: Current Every Day Smoker    Packs/day: 0.25    Years: 20.00    Pack years: 5.00  . Smokeless tobacco: Never Used  Vaping Use  . Vaping Use: Former  Substance and Sexual Activity  . Alcohol use: Yes    Alcohol/week: 14.0 standard drinks    Types: 14 Glasses of wine per week  . Drug use: No  . Sexual activity: Not Currently    Birth control/protection: Post-menopausal  Other Topics Concern  . Not on file  Social History Narrative   Exercises rarely. Smokes tobacco. Works in a Cytogeneticist. Lives in [place]. Eats meat fruit and vegetables. Married for 47 years,lives with husband.   Social Determinants of Health   Financial Resource Strain:   . Difficulty of Paying Living Expenses:   Food Insecurity:   . Worried About Charity fundraiser in the Last Year:   . Arboriculturist in  the Last Year:   Transportation Needs:   . Film/video editor (Medical):   [name] Lack of Transportation (Non-Medical):   Physical Activity:   . Days of Exercise per Week:   . Minutes of Exercise per Session:   Stress:   . Feeling of Stress :   Social Connections:   . Frequency of Communication with Friends and Family:   . Frequency of Social Gatherings with Friends and Family:   . Attends Religious Services:   . Active Member of Clubs or Organizations:   . Attends Archivist Meetings:   [name] Marital Status:   Intimate Partner Violence:   . Fear of Current or Ex-Partner:   . Emotionally Abused:   [name] Physically Abused:   . Sexually Abused:     Past Surgical  History:  Procedure Laterality Date  . TUBAL LIGATION        Current Outpatient Medications:  .  albuterol (PROAIR HFA) 108 (90 Base) MCG/ACT inhaler, Inhale 2 puffs into the lungs every 6 (six) hours as needed., Disp: 18 g, Rfl: 6 .  Cholecalciferol (VITAMIN D3) 125 MCG (5000 UT) TABS, Take 2 tablets by mouth daily., Disp: , Rfl:  .  estradiol (ESTRACE) 0.5 MG tablet, Take 1 tablet (0.5 mg total) by mouth daily., Disp: 90 tablet, Rfl: 1 .  LINZESS 72 MCG capsule, Take 1 capsule by mouth daily at 12 noon. , Disp: , Rfl:  .  metFORMIN (GLUCOPHAGE) 500 MG tablet, Take 1 tablet (500 mg total) by mouth 2 (two) times daily with a meal., Disp: 180 tablet, Rfl: 1 .  progesterone (PROMETRIUM) 200 MG capsule, Take 2 capsules (400 mg total) by mouth daily., Disp: 180 capsule, Rfl: 1 .  varenicline (CHANTIX STARTING MONTH PAK) 0.5 MG X 11 & 1 MG X 42 tablet, Take one 0.5 mg tablet by mouth once daily for 3 days, then increase to one 0.5 mg tablet twice daily for 4 days, then increase to one 1 mg tablet twice daily., Disp: 53 tablet, Rfl: 0    Physical Exam: Height 5\' 6"  (1.676 m), weight 175 lb (79.4 kg), last menstrual period 03/30/2011.    Affect appropriate Healthy:  appears stated age 44: normal Neck supple with no adenopathy JVP normal no bruits no thyromegaly Lungs diffuse wheezing  Heart:  S1/S2 no murmur, no rub, gallop or click PMI normal Abdomen: benighn, BS positve, no tenderness, no AAA no bruit.  No HSM or HJR Distal pulses intact with no bruits No edema Neuro non-focal Skin warm and dry No muscular weakness   Labs:   Lab Results  Component Value Date   WBC 6.7 09/03/2019   HGB 14.2 09/03/2019   HCT 41.5 09/03/2019   MCV 95.4 09/03/2019   PLT 235 09/03/2019    No results for input(s): NA, K, CL, CO2, BUN, CREATININE, CALCIUM, PROT, BILITOT, ALKPHOS, ALT, AST, GLUCOSE in the last 168 hours.  Invalid input(s): LABALBU    Radiology: NM Myocar Multi W/Spect  W/Wall Motion / EF  Result Date: 09/19/2019  Blood pressure demonstrated a normal response to exercise.  There was no ST segment deviation noted during stress.  The study is normal. There are no perfusion defects  This is a low risk study.  The left ventricular ejection fraction is hyperdynamic (>65%).  [facility] treadmill score of 5.5 supports low risk for major cardiac events.    ECHOCARDIOGRAM COMPLETE  Result Date: 09/19/2019    ECHOCARDIOGRAM REPORT   Patient Name:  Shiela Mayer Date of Exam: 09/19/2019 Medical Rec #:  [id number]        Height:       66.0 in Accession #:    [id number]       Weight:       175.0 lb Date of Birth:  [birth year]         BSA:          1.889 m Patient Age:    75 years         BP:           132/84 mmHg Patient Gender: F                HR:           69 bpm. Exam Location:  [facility] Procedure: 2D Echo, Cardiac Doppler and Color Doppler Indications:    Dyspnea 786.09/R06.00  History:        Patient has prior history of Echocardiogram examinations.                 Signs/Symptoms:Shortness of Breath; Risk Factors:Diabetes.                 TOBACCO ABUSE, History of TIAs (From Hx).  Sonographer:    Alvino Chapel RCS Referring Phys: [place]  1. Left ventricular ejection fraction, by estimation, is 60 to 65%. The left ventricle has normal function. The left ventricle has no regional wall motion abnormalities. Left ventricular diastolic parameters were normal.  2. Right ventricular systolic function is normal. The right ventricular size is normal.  3. The mitral valve is normal in structure. No evidence of mitral valve regurgitation. No evidence of mitral stenosis.  4. The aortic valve has an indeterminant number of cusps. Aortic valve regurgitation is not visualized. No aortic stenosis is present.  5. The inferior vena cava is normal in size with greater than 50% respiratory variability, suggesting right atrial pressure of 3 mmHg. FINDINGS  Left Ventricle: Left  ventricular ejection fraction, by estimation, is 60 to 65%. The left ventricle has normal function. The left ventricle has no regional wall motion abnormalities. The left ventricular internal cavity size was normal in size. There is  no left ventricular hypertrophy. Left ventricular diastolic parameters were normal. Right Ventricle: The right ventricular size is normal. No increase in right ventricular wall thickness. Right ventricular systolic function is normal. Left Atrium: Left atrial size was normal in size. Right Atrium: Right atrial size was normal in size. Pericardium: There is no evidence of pericardial effusion. Mitral Valve: The mitral valve is normal in structure. No evidence of mitral valve regurgitation. No evidence of mitral valve stenosis. Tricuspid Valve: The tricuspid valve is normal in structure. Tricuspid valve regurgitation is not demonstrated. No evidence of tricuspid stenosis. Aortic Valve: The aortic valve has an indeterminant number of cusps. Aortic valve regurgitation is not visualized. No aortic stenosis is present. Aortic valve mean gradient measures 5.4 mmHg. Aortic valve peak gradient measures 11.0 mmHg. Aortic valve area, by VTI measures 1.65 cm. Pulmonic Valve: The pulmonic valve was not well visualized. Pulmonic valve regurgitation is mild. No evidence of pulmonic stenosis. Aorta: The aortic root is normal in size and structure. Venous: The inferior vena cava is normal in size with greater than 50% respiratory variability, suggesting right atrial pressure of 3 mmHg. IAS/Shunts: No atrial level shunt detected by color flow Doppler.  LEFT VENTRICLE PLAX 2D LVIDd:         4.66  cm  Diastology LVIDs:         2.75 cm  LV e' lateral:   10.90 cm/s LV PW:         0.94 cm  LV E/e' lateral: 8.1 LV IVS:        0.74 cm  LV e' medial:    7.72 cm/s LVOT diam:     1.80 cm  LV E/e' medial:  11.4 LV SV:         54 LV SV Index:   28 LVOT Area:     2.54 cm  RIGHT VENTRICLE RV S prime:     12.30 cm/s  TAPSE (M-mode): 2.2 cm LEFT ATRIUM             Index       RIGHT ATRIUM          Index LA diam:        3.30 cm 1.75 cm/m  RA Area:     9.56 cm LA Vol (A2C):   40.3 ml 21.33 ml/m RA Volume:   17.20 ml 9.10 ml/m LA Vol (A4C):   31.8 ml 16.83 ml/m LA Biplane Vol: 36.7 ml 19.42 ml/m  AORTIC VALVE AV Area (Vmax):    1.52 cm AV Area (Vmean):   1.64 cm AV Area (VTI):     1.65 cm AV Vmax:           165.91 cm/s AV Vmean:          107.658 cm/s AV VTI:            0.326 m AV Peak Grad:      11.0 mmHg AV Mean Grad:      5.4 mmHg LVOT Vmax:         99.00 cm/s LVOT Vmean:        69.200 cm/s LVOT VTI:          0.211 m LVOT/AV VTI ratio: 0.65  AORTA Ao Root diam: 2.70 cm MITRAL VALVE MV Area (PHT): 3.31 cm    SHUNTS MV Decel Time: 229 msec    Systemic VTI:  0.21 m MV E velocity: 88.30 cm/s  Systemic Diam: 1.80 cm MV A velocity: 89.80 cm/s MV E/A ratio:  0.98 Carlyle Dolly MD Electronically signed by Carlyle Dolly MD Signature Date/Time: 09/19/2019/2:54:34 PM    Final     EKG: ECG done in primary office 09/03/19 poor R wave progression    ASSESSMENT AND PLAN:   1. Chest Pain : atypical history of normal myovue in 2012 ECG with poor R wave progression Normal echo and ETT previously this month F/U cardiac CTA with calcium score. Premedicate for vague reactions to iodine ? See HPI with benedryl and prednisone  2  Smoking:  Now on Chantix counseled for < 10 minutes Dyspnea related to this with some asthmatic component as she was wheezy in clinic today Albuterol PRN consider f/u with primary for maintenance inhaler Echo done 09/19/19 normal with no valve disease or signs of pulmonary HTN   3. DM:  Discussed low carb diet.  Target hemoglobin A1c is 6.5 or less.  Continue current medications. 4. PCOS:  On hormone replacement levels followed by Dr Anastasio Champion   Signed: [name] 09/25/2019, 2:05 PM

## 2019-10-15 ENCOUNTER — Ambulatory Visit: Payer: Self-pay | Admitting: Neurology

## 2019-10-16 ENCOUNTER — Other Ambulatory Visit (INDEPENDENT_AMBULATORY_CARE_PROVIDER_SITE_OTHER): Payer: Self-pay

## 2019-10-16 ENCOUNTER — Other Ambulatory Visit: Payer: Self-pay

## 2019-10-16 ENCOUNTER — Ambulatory Visit (INDEPENDENT_AMBULATORY_CARE_PROVIDER_SITE_OTHER): Payer: BC Managed Care – PPO | Admitting: Internal Medicine

## 2019-10-16 ENCOUNTER — Encounter (INDEPENDENT_AMBULATORY_CARE_PROVIDER_SITE_OTHER): Payer: Self-pay | Admitting: Internal Medicine

## 2019-10-16 VITALS — BP 140/90 | HR 84 | Temp 97.9°F | Ht 66.0 in | Wt 176.4 lb

## 2019-10-16 DIAGNOSIS — J4521 Mild intermittent asthma with (acute) exacerbation: Secondary | ICD-10-CM

## 2019-10-16 DIAGNOSIS — E2839 Other primary ovarian failure: Secondary | ICD-10-CM | POA: Diagnosis not present

## 2019-10-16 DIAGNOSIS — E282 Polycystic ovarian syndrome: Secondary | ICD-10-CM

## 2019-10-16 DIAGNOSIS — E119 Type 2 diabetes mellitus without complications: Secondary | ICD-10-CM

## 2019-10-16 DIAGNOSIS — E559 Vitamin D deficiency, unspecified: Secondary | ICD-10-CM

## 2019-10-16 MED ORDER — FLUTICASONE-SALMETEROL 100-50 MCG/DOSE IN AEPB
1.0000 | INHALATION_SPRAY | Freq: Two times a day (BID) | RESPIRATORY_TRACT | 3 refills | Status: DC
Start: 2019-10-16 — End: 2020-03-11

## 2019-10-16 MED ORDER — ADVAIR HFA 45-21 MCG/ACT IN AERO: 2.0000 | INHALATION_SPRAY | Freq: Two times a day (BID) | RESPIRATORY_TRACT | 12 refills | Status: DC

## 2019-10-16 NOTE — Progress Notes (Signed)
Metrics: Intervention Frequency ACO  Documented Smoking Status Yearly  Screened one or more times in 24 months  Cessation Counseling or  Active cessation medication Past 24 months  Past 24 months   Guideline developer: UpToDate (See UpToDate for funding source) Date Released: 2014       Wellness Office Visit  Subjective:  Patient ID: Gwendolyn [name], female    DOB: Apr 07, 1965  Age: 54 y.o. MRN: [id number]  CC: This lady comes in for follow-up of diabetes, PCOS, menopausal symptoms, vitamin D deficiency. HPI  She also has mild asthma and wonders whether she can have an inhaler.  She is to take Advair. I went over with her the results of her blood work in July and this shows improving diabetic control.  Cholesterol numbers are not too bad.  His C-reactive protein is normal. Estradiol levels are suboptimal but progesterone levels were in a good range. She has seen cardiology and work-up so far has been negative for coronary artery disease. Past Medical History:  Diagnosis Date  . Diabetes mellitus without complication ([place])   . History of TIAs   . Palpitations   . Polycystic ovarian disease   . Tobacco abuse    Past Surgical History:  Procedure Laterality Date  . TUBAL LIGATION       Family History  Problem Relation Age of Onset  . Hypertension Mother   . Diabetes Father   . Diabetes Brother     Social History   Social History Narrative   Exercises rarely. Smokes tobacco. 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 Eats meat fruit and vegetables. Married for 42 years,lives with husband.   Social History   Tobacco Use  . Smoking status: Current Every Day Smoker    Packs/day: 0.25    Years: 20.00    Pack years: 5.00  . Smokeless tobacco: Never Used  Substance Use Topics  . Alcohol use: Yes    Alcohol/week: 14.0 standard drinks    Types: 14 Glasses of wine per week    Current Meds  Medication Sig  .  albuterol (PROAIR HFA) 108 (90 Base) MCG/ACT inhaler Inhale 2 puffs into the lungs every 6 (six) hours as needed.  . Cholecalciferol (VITAMIN D3) 125 MCG (5000 UT) TABS Take 2 tablets by mouth daily.  [name] estradiol (ESTRACE) 0.5 MG tablet Take 1 tablet (0.5 mg total) by mouth daily.  [name] LINZESS 72 MCG capsule Take 1 capsule by mouth daily at 12 noon.   . metFORMIN (GLUCOPHAGE) 500 MG tablet Take 1 tablet (500 mg total) by mouth 2 (two) times daily with a meal.  . nitroGLYCERIN (NITROSTAT) 0.4 MG SL tablet Place 1 tablet (0.4 mg total) under the tongue every 5 (five) minutes as needed.  . progesterone (PROMETRIUM) 200 MG capsule Take 2 capsules (400 mg total) by mouth daily.      Depression screen [facility] 2/9 09/03/2019 11/19/2016  Decreased Interest 0 0  Down, Depressed, Hopeless 0 0  PHQ - 2 Score 0 0     Objective:   Today's Vitals: BP 140/90 (BP Location: Right Arm, Patient Position: Sitting, Cuff Size: Normal)   Pulse 84   Temp 97.9 F (36.6 C) (Temporal)   Ht 5\' 6"  (1.676 m)   Wt 176 lb 6.4 oz (80 kg)   LMP 03/30/2011   SpO2 94%   BMI 28.47 kg/m  Vitals with BMI 10/16/2019 09/25/2019 09/05/2019  Height 5\' 6"  5\' 6"  5\' 6"   Weight 176  lbs 6 oz 175 lbs 175 lbs  BMI 28.49 03.49 17.91  Systolic 505 697 948  Diastolic 90 84 82  Pulse 84 - 76     Physical Exam  She looks systemically well and her weight is stable.  Blood pressure is slightly elevated.     Assessment   1. Type 2 diabetes mellitus without complication, without long-term current use of insulin ([place])   2. PCOS (polycystic ovarian syndrome)   3. Vitamin D deficiency disease   4. Female hypogonadism syndrome   5. Mild intermittent asthma with acute exacerbation       Tests ordered No orders of the defined types were placed in this encounter.    Plan: 1. She will continue to focus on nutrition for diabetic control.  Today we discussed the blue zones and the importance of reducing animal protein in having a  plant-based diet, focusing on the importance of daily beans and nuts. 2. As far as her menopausal symptoms are concerned, I want to optimize her estradiol so I have told her to take a dose of estradiol 0.75 mg daily which will be 1-1/2 tablets of estradiol 0.5 mg tablets.  Hopefully she can tolerate this increase. 3. I will send a prescription for Advair to her pharmacy for asthma. 4. Follow-up in the next couple of months to see how she is doing and we will check levels then.   Meds ordered this encounter  Medications  . Fluticasone-Salmeterol (ADVAIR) 100-50 MCG/DOSE AEPB    Sig: Inhale 1 puff into the lungs 2 (two) times daily.    Dispense:  1 each    Refill:  3    Jaquin Coy Luther Parody, MD

## 2019-10-18 ENCOUNTER — Encounter (INDEPENDENT_AMBULATORY_CARE_PROVIDER_SITE_OTHER): Payer: Self-pay | Admitting: Internal Medicine

## 2019-10-18 NOTE — Telephone Encounter (Signed)
Please advise which inhaler to use?

## 2019-11-19 ENCOUNTER — Ambulatory Visit (HOSPITAL_COMMUNITY): Payer: BC Managed Care – PPO

## 2019-12-10 ENCOUNTER — Encounter (INDEPENDENT_AMBULATORY_CARE_PROVIDER_SITE_OTHER): Payer: Self-pay | Admitting: Internal Medicine

## 2019-12-10 ENCOUNTER — Other Ambulatory Visit: Payer: Self-pay

## 2019-12-10 ENCOUNTER — Ambulatory Visit (INDEPENDENT_AMBULATORY_CARE_PROVIDER_SITE_OTHER): Payer: BC Managed Care – PPO | Admitting: Internal Medicine

## 2019-12-10 VITALS — BP 136/82 | HR 83 | Temp 97.7°F | Ht 66.0 in | Wt 176.0 lb

## 2019-12-10 DIAGNOSIS — E119 Type 2 diabetes mellitus without complications: Secondary | ICD-10-CM

## 2019-12-10 DIAGNOSIS — E282 Polycystic ovarian syndrome: Secondary | ICD-10-CM

## 2019-12-10 DIAGNOSIS — E2839 Other primary ovarian failure: Secondary | ICD-10-CM

## 2019-12-10 NOTE — Progress Notes (Signed)
Metrics: Intervention Frequency ACO  Documented Smoking Status Yearly  Screened one or more times in 24 months  Cessation Counseling or  Active cessation medication Past 24 months  Past 24 months   Guideline developer: UpToDate (See UpToDate for funding source) Date Released: 2014       Wellness Office Visit  Subjective:  Patient ID: Gwendolyn [name], female    DOB: 28-Jul-1965  Age: 54 y.o. MRN: [id number]  CC: This lady comes in for follow-up of diabetes, PCOS and menopausal symptoms. HPI  I think she has tolerated the slightly higher dose of estradiol of 0.75 mg daily.  She says she has a craving for sugar in the last week or so. She continues on Metformin for her diabetes and her last hemoglobin A1c was improved at 7.3%. She is frustrated that she has not lost further weight. Unfortunately, she continues to smoke cigarettes. Past Medical History:  Diagnosis Date  . Diabetes mellitus without complication ([place])   . History of TIAs   . Palpitations   . Polycystic ovarian disease   . Tobacco abuse    Past Surgical History:  Procedure Laterality Date  . TUBAL LIGATION       Family History  Problem Relation Age of Onset  . Hypertension Mother   . Diabetes Father   . Diabetes Brother     Social History   Social History Narrative   Exercises rarely. Smokes tobacco. 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 Eats meat fruit and vegetables. Married for 34 years,lives with husband.   Social History   Tobacco Use  . Smoking status: Current Every Day Smoker    Packs/day: 0.25    Years: 20.00    Pack years: 5.00  . Smokeless tobacco: Never Used  Substance Use Topics  . Alcohol use: Yes    Alcohol/week: 14.0 standard drinks    Types: 14 Glasses of wine per week    Current Meds  Medication Sig  . albuterol (PROAIR HFA) 108 (90 Base) MCG/ACT inhaler Inhale 2 puffs into the lungs every 6 (six) hours as needed.  .  Cholecalciferol (VITAMIN D3) 125 MCG (5000 UT) TABS Take 2 tablets by mouth daily.  [name] estradiol (ESTRACE) 0.5 MG tablet Take 1 tablet (0.5 mg total) by mouth daily. (Patient taking differently: Take 0.75 mg by mouth daily. )  . fluticasone-salmeterol (ADVAIR HFA) 45-21 MCG/ACT inhaler Inhale 2 puffs into the lungs 2 (two) times daily.  [name] LINZESS 72 MCG capsule Take 1 capsule by mouth daily at 12 noon.   . metFORMIN (GLUCOPHAGE) 500 MG tablet Take 1 tablet (500 mg total) by mouth 2 (two) times daily with a meal.  . nitroGLYCERIN (NITROSTAT) 0.4 MG SL tablet Place 1 tablet (0.4 mg total) under the tongue every 5 (five) minutes as needed.  . progesterone (PROMETRIUM) 200 MG capsule Take 2 capsules (400 mg total) by mouth daily.      Depression screen [facility] 2/9 09/03/2019 11/19/2016  Decreased Interest 0 0  Down, Depressed, Hopeless 0 0  PHQ - 2 Score 0 0     Objective:   Today's Vitals: BP 136/82   Pulse 83   Temp 97.7 F (36.5 C) (Temporal)   Ht 5\' 6"  (1.676 m)   Wt 176 lb (79.8 kg)   LMP 03/30/2011   SpO2 96%   BMI 28.41 kg/m  Vitals with BMI 12/10/2019 10/16/2019 09/25/2019  Height 5\' 6"  5\' 6"  5\' 6"   Weight 176 lbs 176  lbs 6 oz 175 lbs  BMI 28.42 83.81 84.03  Systolic 754 360 677  Diastolic 82 90 84  Pulse 83 84 -     Physical Exam  She looks systemically well.  She remains overweight.  Weight is stable.  Blood pressure acceptable.     Assessment   1. Type 2 diabetes mellitus without complication, without long-term current use of insulin ([place])   2. Female hypogonadism syndrome   3. PCOS (polycystic ovarian syndrome)       Tests ordered Orders Placed This Encounter  Procedures  . Progesterone  . Estradiol  . Hemoglobin A1c     Plan: 1. She will continue with Metformin and we will check an A1c today. 2. She will continue with estradiol and progesterone I will check these levels today. 3. Today I discussed the possibility of using Rybelsus for her diabetes  should the A1c not improve as I think this will also help her lose weight. 4. We discussed COVID-19 booster and she is certainly a candidate for this. 5. Follow-up in 3 months.   No orders of the defined types were placed in this encounter.   Doree Albee, MD

## 2019-12-11 ENCOUNTER — Other Ambulatory Visit (INDEPENDENT_AMBULATORY_CARE_PROVIDER_SITE_OTHER): Payer: Self-pay | Admitting: Internal Medicine

## 2019-12-11 LAB — HEMOGLOBIN A1C
Hgb A1c MFr Bld: 7.1 % of total Hgb — ABNORMAL HIGH (ref <5.7)
Mean Plasma Glucose: 157 (calc)
eAG (mmol/L): 8.7 (calc)

## 2019-12-11 LAB — ESTRADIOL: Estradiol: 33 pg/mL

## 2019-12-11 LAB — PROGESTERONE: Progesterone: 20.7 ng/mL

## 2019-12-11 MED ORDER — RYBELSUS 3 MG PO TABS: 3.0000 mg | ORAL_TABLET | Freq: Every day | ORAL | 3 refills | Status: DC

## 2019-12-12 ENCOUNTER — Encounter (INDEPENDENT_AMBULATORY_CARE_PROVIDER_SITE_OTHER): Payer: Self-pay | Admitting: Internal Medicine

## 2020-02-20 ENCOUNTER — Other Ambulatory Visit: Payer: Self-pay

## 2020-02-20 ENCOUNTER — Ambulatory Visit (INDEPENDENT_AMBULATORY_CARE_PROVIDER_SITE_OTHER): Payer: BC Managed Care – PPO | Admitting: Internal Medicine

## 2020-02-20 ENCOUNTER — Encounter (INDEPENDENT_AMBULATORY_CARE_PROVIDER_SITE_OTHER): Payer: Self-pay | Admitting: Internal Medicine

## 2020-02-20 VITALS — BP 144/86 | Temp 96.6°F | Resp 20 | Ht 66.0 in | Wt 173.0 lb

## 2020-02-20 DIAGNOSIS — F419 Anxiety disorder, unspecified: Secondary | ICD-10-CM | POA: Diagnosis not present

## 2020-02-20 MED ORDER — BUSPIRONE HCL 5 MG PO TABS: 5.0000 mg | ORAL_TABLET | Freq: Three times a day (TID) | ORAL | 0 refills | Status: DC

## 2020-02-20 MED ORDER — ESCITALOPRAM OXALATE 5 MG PO TABS: 5.0000 mg | ORAL_TABLET | Freq: Every day | ORAL | 3 refills | Status: DC

## 2020-02-20 NOTE — Progress Notes (Signed)
Metrics: Intervention Frequency ACO  Documented Smoking Status Yearly  Screened one or more times in 24 months  Cessation Counseling or  Active cessation medication Past 24 months  Past 24 months   Guideline developer: UpToDate (See UpToDate for funding source) Date Released: 2014       Wellness Office Visit  Subjective:  Patient ID: Gwendolyn [name], female    DOB: September 17, 1965  Age: 55 y.o. MRN: [id number]  CC: Anxiety/panic attacks  HPI This lady comes in for an acute visit.  She says that for the last 1 month, when she goes on to [employer], she feels extremely anxious and even to the point where she has air hunger.  She denies any paresthesia of her lips or hands.  She denies any palpitations.  On closer questioning, she appears to have lots of stress involving her work and family.  There is no 1 particular triggering event.  Past Medical History:  Diagnosis Date  . Diabetes mellitus without complication ([place])   . History of TIAs   . Palpitations   . Polycystic ovarian disease   . Tobacco abuse    Past Surgical History:  Procedure Laterality Date  . TUBAL LIGATION       Family History  Problem Relation Age of Onset  . Hypertension Mother   . Diabetes Father   . Diabetes Brother     Social History   Social History Narrative   Exercises rarely. Smokes tobacco. Works in a Cytogeneticist. Lives in [place]. Eats meat fruit and vegetables. Married for 19 years,lives with husband.   Social History   Tobacco Use  . Smoking status: Current Every Day Smoker    Packs/day: 0.25    Years: 20.00    Pack years: 5.00  . Smokeless tobacco: Never Used  Substance Use Topics  . Alcohol use: Yes    Alcohol/week: 14.0 standard drinks    Types: 14 Glasses of wine per week    Current Meds  Medication Sig  . albuterol (PROAIR HFA) 108 (90 Base) MCG/ACT inhaler Inhale 2 puffs into the lungs every 6 (six) hours as needed.  .  busPIRone (BUSPAR) 5 MG tablet Take 1 tablet (5 mg total) by mouth 3 (three) times daily.  . Cholecalciferol (VITAMIN D3) 125 MCG (5000 UT) TABS Take 2 tablets by mouth daily.  [name] escitalopram (LEXAPRO) 5 MG tablet Take 1 tablet (5 mg total) by mouth daily.  [name] estradiol (ESTRACE) 0.5 MG tablet Take 1 tablet (0.5 mg total) by mouth daily. (Patient taking differently: Take 0.75 mg by mouth daily.)  . fluticasone-salmeterol (ADVAIR HFA) 45-21 MCG/ACT inhaler Inhale 2 puffs into the lungs 2 (two) times daily.  . Fluticasone-Salmeterol (ADVAIR) 100-50 MCG/DOSE AEPB Inhale 1 puff into the lungs 2 (two) times daily.  [name] LINZESS 72 MCG capsule Take 1 capsule by mouth daily at 12 noon.   . metFORMIN (GLUCOPHAGE) 500 MG tablet Take 1 tablet (500 mg total) by mouth 2 (two) times daily with a meal.  . nitroGLYCERIN (NITROSTAT) 0.4 MG SL tablet Place 1 tablet (0.4 mg total) under the tongue every 5 (five) minutes as needed.  . progesterone (PROMETRIUM) 200 MG capsule Take 2 capsules (400 mg total) by mouth daily.      Depression screen [facility] 2/9 09/03/2019 11/19/2016  Decreased Interest 0 0  Down, Depressed, Hopeless 0 0  PHQ - 2 Score 0 0     Objective:   Today's Vitals: BP (!) 144/86 (BP  Location: Right Arm, Patient Position: Sitting, Cuff Size: Large)   Temp (!) 96.6 F (35.9 C) (Temporal)   Resp 20   Ht 5\' 6"  (1.676 m)   Wt 173 lb (78.5 kg)   LMP 03/30/2011   SpO2 96%   BMI 27.92 kg/m  Vitals with BMI 02/20/2020 12/10/2019 10/16/2019  Height 5\' 6"  5\' 6"  5\' 6"   Weight 173 lbs 176 lbs 176 lbs 6 oz  BMI 27.94 28.42 28.49  Systolic 144 136 10/18/2019  Diastolic 86 82 90  Pulse - 83 84     Physical Exam  She looks systemically well.     Assessment   1. Anxiety       Tests ordered No orders of the defined types were placed in this encounter.    Plan: 1. I believe she is having panic attacks compounded by overwhelming stress in her work and family life at the present time.  We will try  her on Lexapro daily but she can use BuSpar as needed, hopefully she will not need this.  Also I described to her how to meditate and she should do this every day for at least 5 to 10 minutes.  Hopefully this will help her. 2. Follow-up as scheduled in the next 3 weeks or so and we will see how she is doing then. 3. I spent 30 minutes with this patient discussing all the possibilities regarding the etiology of her anxiety/panic attacks and also discussed medications that I am prescribing including side effects.   Meds ordered this encounter  Medications  . busPIRone (BUSPAR) 5 MG tablet    Sig: Take 1 tablet (5 mg total) by mouth 3 (three) times daily.    Dispense:  30 tablet    Refill:  0  . escitalopram (LEXAPRO) 5 MG tablet    Sig: Take 1 tablet (5 mg total) by mouth daily.    Dispense:  30 tablet    Refill:  3    Skyy Mcknight , MD

## 2020-03-11 ENCOUNTER — Other Ambulatory Visit: Payer: Self-pay

## 2020-03-11 ENCOUNTER — Ambulatory Visit (INDEPENDENT_AMBULATORY_CARE_PROVIDER_SITE_OTHER): Payer: BC Managed Care – PPO | Admitting: Internal Medicine

## 2020-03-11 ENCOUNTER — Encounter (INDEPENDENT_AMBULATORY_CARE_PROVIDER_SITE_OTHER): Payer: Self-pay | Admitting: Internal Medicine

## 2020-03-11 VITALS — BP 120/80 | HR 92 | Temp 98.2°F | Ht 66.0 in | Wt 173.8 lb

## 2020-03-11 DIAGNOSIS — E282 Polycystic ovarian syndrome: Secondary | ICD-10-CM | POA: Diagnosis not present

## 2020-03-11 DIAGNOSIS — F419 Anxiety disorder, unspecified: Secondary | ICD-10-CM

## 2020-03-11 DIAGNOSIS — R5383 Other fatigue: Secondary | ICD-10-CM

## 2020-03-11 DIAGNOSIS — E2839 Other primary ovarian failure: Secondary | ICD-10-CM | POA: Diagnosis not present

## 2020-03-11 DIAGNOSIS — E119 Type 2 diabetes mellitus without complications: Secondary | ICD-10-CM

## 2020-03-11 DIAGNOSIS — R5381 Other malaise: Secondary | ICD-10-CM

## 2020-03-11 MED ORDER — METFORMIN HCL 500 MG PO TABS
500.0000 mg | ORAL_TABLET | Freq: Two times a day (BID) | ORAL | 1 refills | Status: DC
Start: 2020-03-11 — End: 2020-09-17

## 2020-03-11 MED ORDER — ESCITALOPRAM OXALATE 10 MG PO TABS: 10.0000 mg | ORAL_TABLET | Freq: Every day | ORAL | 3 refills | Status: DC

## 2020-03-11 MED ORDER — ADVAIR HFA 45-21 MCG/ACT IN AERO: 2.0000 | INHALATION_SPRAY | Freq: Two times a day (BID) | RESPIRATORY_TRACT | 12 refills | Status: DC

## 2020-03-11 MED ORDER — PROGESTERONE 200 MG PO CAPS
400.0000 mg | ORAL_CAPSULE | Freq: Every day | ORAL | 1 refills | Status: DC
Start: 2020-03-11 — End: 2020-04-14

## 2020-03-11 NOTE — Progress Notes (Signed)
Metrics: Intervention Frequency ACO  Documented Smoking Status Yearly  Screened one or more times in 24 months  Cessation Counseling or  Active cessation medication Past 24 months  Past 24 months   Guideline developer: UpToDate (See UpToDate for funding source) Date Released: 2014       Wellness Office Visit  Subjective:  Patient ID: Gwendolyn [name], female    DOB: 05-17-1965  Age: 55 y.o. MRN: [id number]  CC: This lady comes in for follow-up of diabetes, PCOS, menopausal symptoms and more recently anxiety and panic attacks. HPI  When I had seen her a few weeks ago, started on Lexapro for panic attacks but she says that these seem to have not helped her significantly.  However, she has noticed that her behavior is somewhat better than it was previously.  Unfortunately, she does tend to avoid driving on certain roads now and she is worried that she will not be able to go and see her father in [place] because she is afraid to drive on the roads. In terms of her diabetes, she continues with Metformin as before. She continues on estradiol and is tolerated the higher dose of 0.75 mg daily and she continues on progesterone for 100 mg at night. She also has PCOS and testosterone levels historically have been elevated.  She also tells me that she previously had an adrenal adenoma and this was confirmed by me looking at her CT scan results in 2016 which confirmed a 1.1 cm right adrenal adenoma. Past Medical History:  Diagnosis Date  . Diabetes mellitus without complication ([employer])   . History of TIAs   . Palpitations   . Polycystic ovarian disease   . Tobacco abuse    Past Surgical History:  Procedure Laterality Date  . TUBAL LIGATION       Family History  Problem Relation Age of Onset  . Hypertension Mother   . Diabetes Father   . Diabetes Brother     Social History   Social History Narrative   Exercises rarely. Smokes tobacco. Works in a Scientist, research (physical sciences). Lives in [place]. Eats meat fruit and vegetables. Married for 52 years,lives with husband.   Social History   Tobacco Use  . Smoking status: Current Every Day Smoker    Packs/day: 0.25    Years: 20.00    Pack years: 5.00  . Smokeless tobacco: Never Used  Substance Use Topics  . Alcohol use: Yes    Alcohol/week: 14.0 standard drinks    Types: 14 Glasses of wine per week    Current Meds  Medication Sig  . albuterol (PROAIR HFA) 108 (90 Base) MCG/ACT inhaler Inhale 2 puffs into the lungs every 6 (six) hours as needed.  . busPIRone (BUSPAR) 5 MG tablet Take 1 tablet (5 mg total) by mouth 3 (three) times daily.  . Cholecalciferol (VITAMIN D3) 125 MCG (5000 UT) TABS Take 2 tablets by mouth daily.  [name] escitalopram (LEXAPRO) 10 MG tablet Take 1 tablet (10 mg total) by mouth daily.  [name] estradiol (ESTRACE) 0.5 MG tablet Take 1 tablet (0.5 mg total) by mouth daily. (Patient taking differently: Take 0.75 mg by mouth daily.)  . LINZESS 72 MCG capsule Take 1 capsule by mouth daily at 12 noon.   . nitroGLYCERIN (NITROSTAT) 0.4 MG SL tablet Place 1 tablet (0.4 mg total) under the tongue every 5 (five) minutes as needed.  . [DISCONTINUED] escitalopram (LEXAPRO) 5 MG tablet Take 1 tablet (5 mg total) by mouth  daily.  . [DISCONTINUED] fluticasone-salmeterol (ADVAIR HFA) 45-21 MCG/ACT inhaler Inhale 2 puffs into the lungs 2 (two) times daily.  . [DISCONTINUED] Fluticasone-Salmeterol (ADVAIR) 100-50 MCG/DOSE AEPB Inhale 1 puff into the lungs 2 (two) times daily.  . [DISCONTINUED] metFORMIN (GLUCOPHAGE) 500 MG tablet Take 1 tablet (500 mg total) by mouth 2 (two) times daily with a meal.  . [DISCONTINUED] progesterone (PROMETRIUM) 200 MG capsule Take 2 capsules (400 mg total) by mouth daily.      Depression screen [facility] 2/9 03/11/2020 09/03/2019 11/19/2016  Decreased Interest 2 0 0  Down, Depressed, Hopeless 2 0 0  PHQ - 2 Score 4 0 0  Altered sleeping 3 - -  Tired,  decreased energy 3 - -  Change in appetite 1 - -  Feeling bad or failure about yourself  0 - -  Trouble concentrating 1 - -  Moving slowly or fidgety/restless 0 - -  Suicidal thoughts 0 - -  PHQ-9 Score 12 - -  Difficult doing work/chores Somewhat difficult - -     Objective:   Today's Vitals: BP 120/80   Pulse 92   Temp 98.2 F (36.8 C) (Temporal)   Ht 5\' 6"  (1.676 m)   Wt 173 lb 12.8 oz (78.8 kg)   LMP 03/30/2011   SpO2 97%   BMI 28.05 kg/m  Vitals with BMI 03/11/2020 02/20/2020 12/10/2019  Height 5\' 6"  5\' 6"  5\' 6"   Weight 173 lbs 13 oz 173 lbs 176 lbs  BMI 28.07 81.82 99.37  Systolic 169 678 938  Diastolic 80 86 82  Pulse 92 - 83     Physical Exam  She looks systemically well.  Weight is stable.  Blood pressure is excellent.     Assessment   1. Anxiety   2. Type 2 diabetes mellitus without complication, without long-term current use of insulin ([place])   3. Female hypogonadism syndrome   4. PCOS (polycystic ovarian syndrome)   5. Malaise and fatigue       Tests ordered Orders Placed This Encounter  Procedures  . COMPLETE METABOLIC PANEL WITH GFR  . Hemoglobin A1c  . T3, free  . T4, free  . TSH  . Progesterone  . Estradiol  . Testos,Total,Free and SHBG (Female)  . DHEA-sulfate     Plan: 1. In terms of her anxiety and panic attacks, I am going to increase the dose of Lexapro but I have urged her to seek a psychiatrist.  She knows of 1 in [place] and she will let me know if we need to refer her. 2. She will continue with Metformin and we will check an A1c today. 3. She will continue with estradiol and progesterone and I will check levels today along with testosterone and DHEA. 4. I will also check thyroid function again. 5. Further recommendations will depend on blood results and I will see her in 3 months time for follow-up   Meds ordered this encounter  Medications  . fluticasone-salmeterol (ADVAIR HFA) 45-21 MCG/ACT inhaler    Sig: Inhale 2  puffs into the lungs 2 (two) times daily.    Dispense:  1 each    Refill:  12  . metFORMIN (GLUCOPHAGE) 500 MG tablet    Sig: Take 1 tablet (500 mg total) by mouth 2 (two) times daily with a meal.    Dispense:  180 tablet    Refill:  1  . progesterone (PROMETRIUM) 200 MG capsule    Sig: Take 2 capsules (400 mg total) by  mouth daily.    Dispense:  180 capsule    Refill:  1  . escitalopram (LEXAPRO) 10 MG tablet    Sig: Take 1 tablet (10 mg total) by mouth daily.    Dispense:  30 tablet    Refill:  3    Oline Belk Luther Parody, MD

## 2020-03-16 LAB — DHEA-SULFATE: DHEA-SO4: 273 ug/dL — ABNORMAL HIGH (ref 8–188)

## 2020-03-16 LAB — COMPLETE METABOLIC PANEL WITH GFR
AG Ratio: 2 (calc) (ref 1.0–2.5)
ALT: 23 U/L (ref 6–29)
AST: 15 U/L (ref 10–35)
Albumin: 4.5 g/dL (ref 3.6–5.1)
Alkaline phosphatase (APISO): 54 U/L (ref 37–153)
BUN: 16 mg/dL (ref 7–25)
CO2: 27 mmol/L (ref 20–32)
Calcium: 9.8 mg/dL (ref 8.6–10.4)
Chloride: 102 mmol/L (ref 98–110)
Creat: 0.62 mg/dL (ref 0.50–1.05)
GFR, Est African American: 120 mL/min/{1.73_m2} (ref >=60)
GFR, Est Non African American: 104 mL/min/{1.73_m2} (ref >=60)
Globulin: 2.3 g/dL (calc) (ref 1.9–3.7)
Glucose, Bld: 183 mg/dL — ABNORMAL HIGH (ref 65–139)
Potassium: 4.2 mmol/L (ref 3.5–5.3)
Sodium: 139 mmol/L (ref 135–146)
Total Bilirubin: 0.3 mg/dL (ref 0.2–1.2)
Total Protein: 6.8 g/dL (ref 6.1–8.1)

## 2020-03-16 LAB — TESTOS,TOTAL,FREE AND SHBG (FEMALE)
Free Testosterone: 1.7 pg/mL (ref 0.1–6.4)
Sex Hormone Binding: 29 nmol/L (ref 17–124)
Testosterone, Total, LC-MS-MS: 11 ng/dL (ref 2–45)

## 2020-03-16 LAB — T3, FREE: T3, Free: 3.8 pg/mL (ref 2.3–4.2)

## 2020-03-16 LAB — HEMOGLOBIN A1C
Hgb A1c MFr Bld: 7.5 % of total Hgb — ABNORMAL HIGH (ref <5.7)
Mean Plasma Glucose: 169 mg/dL
eAG (mmol/L): 9.3 mmol/L

## 2020-03-16 LAB — ESTRADIOL: Estradiol: 30 pg/mL

## 2020-03-16 LAB — T4, FREE: Free T4: 1.2 ng/dL (ref 0.8–1.8)

## 2020-03-16 LAB — PROGESTERONE: Progesterone: 14.1 ng/mL

## 2020-03-16 LAB — TSH: TSH: 1.25 mIU/L

## 2020-03-17 ENCOUNTER — Encounter (INDEPENDENT_AMBULATORY_CARE_PROVIDER_SITE_OTHER): Payer: Self-pay | Admitting: Internal Medicine

## 2020-03-17 NOTE — Telephone Encounter (Signed)
Please advise 

## 2020-03-24 ENCOUNTER — Encounter (INDEPENDENT_AMBULATORY_CARE_PROVIDER_SITE_OTHER): Payer: Self-pay

## 2020-03-24 NOTE — Progress Notes (Signed)
Called pt phone contact. Not able to leave a message. Will try on next business day.Also sent a mychart message to notify that she seen results to reply back.

## 2020-04-12 ENCOUNTER — Encounter (INDEPENDENT_AMBULATORY_CARE_PROVIDER_SITE_OTHER): Payer: Self-pay | Admitting: Internal Medicine

## 2020-04-14 ENCOUNTER — Other Ambulatory Visit (INDEPENDENT_AMBULATORY_CARE_PROVIDER_SITE_OTHER): Payer: Self-pay | Admitting: Internal Medicine

## 2020-04-14 MED ORDER — ESTRADIOL 1 MG PO TABS: 1.0000 mg | ORAL_TABLET | Freq: Every day | ORAL | 1 refills | Status: DC

## 2020-04-14 MED ORDER — PROGESTERONE 200 MG PO CAPS: 400.0000 mg | ORAL_CAPSULE | Freq: Every day | ORAL | 1 refills | Status: DC

## 2020-05-30 ENCOUNTER — Encounter (INDEPENDENT_AMBULATORY_CARE_PROVIDER_SITE_OTHER): Payer: Self-pay | Admitting: Internal Medicine

## 2020-05-30 MED ORDER — ADVAIR HFA 45-21 MCG/ACT IN AERO: 2.0000 | INHALATION_SPRAY | Freq: Two times a day (BID) | RESPIRATORY_TRACT | 3 refills | Status: DC

## 2020-06-09 ENCOUNTER — Ambulatory Visit (INDEPENDENT_AMBULATORY_CARE_PROVIDER_SITE_OTHER): Payer: BC Managed Care – PPO | Admitting: Internal Medicine

## 2020-06-09 ENCOUNTER — Other Ambulatory Visit: Payer: Self-pay

## 2020-06-09 ENCOUNTER — Encounter (INDEPENDENT_AMBULATORY_CARE_PROVIDER_SITE_OTHER): Payer: Self-pay | Admitting: Internal Medicine

## 2020-06-09 VITALS — BP 128/78 | HR 86 | Temp 97.7°F | Ht 66.0 in | Wt 171.4 lb

## 2020-06-09 DIAGNOSIS — E2839 Other primary ovarian failure: Secondary | ICD-10-CM

## 2020-06-09 DIAGNOSIS — E119 Type 2 diabetes mellitus without complications: Secondary | ICD-10-CM | POA: Diagnosis not present

## 2020-06-09 DIAGNOSIS — Z1231 Encounter for screening mammogram for malignant neoplasm of breast: Secondary | ICD-10-CM

## 2020-06-09 DIAGNOSIS — E282 Polycystic ovarian syndrome: Secondary | ICD-10-CM

## 2020-06-09 MED ORDER — NP THYROID 60 MG PO TABS: 60.0000 mg | ORAL_TABLET | Freq: Every day | ORAL | 3 refills | Status: DC

## 2020-06-09 NOTE — Progress Notes (Signed)
Metrics: Intervention Frequency ACO  Documented Smoking Status Yearly  Screened one or more times in 24 months  Cessation Counseling or  Active cessation medication Past 24 months  Past 24 months   Guideline developer: UpToDate (See UpToDate for funding source) Date Released: 2014       Wellness Office Visit  Subjective:  Patient ID: Gwendolyn [name], female    DOB: 01/04/1966  Age: 55 y.o. MRN: [id number]  CC: This pleasant 55 year old lady comes in for follow-up regarding her diabetes, PCOS, biological hormone therapy. HPI  We reviewed her blood work previously which showed suboptimal free T3 levels and she does have some symptoms of thyroid hypofunction.  She also has diabetes, PCOS which all related to insulin resistance. She has managed to increase the estradiol to 1 mg daily and mostly she is tolerating it although she does have slight breast tenderness now.  She has not had vagina bleeding. Thankfully, she now is able to drive without any problems.  The increased dose of Lexapro did help and since that time she has been seeing a psychiatrist.  She has no fear of driving at the present time now. Her blood work previously also showed elevated DHEA levels. We did try her on testosterone therapy before but this produced excessive hair and therefore it was stopped.  She may be willing to try it again. Past Medical History:  Diagnosis Date  . Diabetes mellitus without complication ([place])   . History of TIAs   . Palpitations   . Polycystic ovarian disease   . Tobacco abuse    Past Surgical History:  Procedure Laterality Date  . TUBAL LIGATION       Family History  Problem Relation Age of Onset  . Hypertension Mother   . Diabetes Father   . Diabetes Brother     Social History   Social History Narrative   Exercises rarely. Smokes tobacco. 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 Eats meat fruit and vegetables. Married  for 38 years,lives with husband.   Social History   Tobacco Use  . Smoking status: Current Every Day Smoker    Packs/day: 0.25    Years: 20.00    Pack years: 5.00  . Smokeless tobacco: Never Used  Substance Use Topics  . Alcohol use: Yes    Alcohol/week: 14.0 standard drinks    Types: 14 Glasses of wine per week    Current Meds  Medication Sig  . albuterol (PROAIR HFA) 108 (90 Base) MCG/ACT inhaler Inhale 2 puffs into the lungs every 6 (six) hours as needed.  . busPIRone (BUSPAR) 15 MG tablet Take 15-30 mg by mouth 2 (two) times daily.  . Cholecalciferol (VITAMIN D3) 125 MCG (5000 UT) TABS Take 2 tablets by mouth daily.  . diazepam (VALIUM) 5 MG tablet Take 5 mg by mouth daily as needed.  [name] escitalopram (LEXAPRO) 10 MG tablet Take 1 tablet (10 mg total) by mouth daily.  [name] estradiol (ESTRACE) 1 MG tablet Take 1 tablet (1 mg total) by mouth daily.  . fluticasone-salmeterol (ADVAIR HFA) 45-21 MCG/ACT inhaler Inhale 2 puffs into the lungs 2 (two) times daily.  [name] LINZESS 72 MCG capsule Take 1 capsule by mouth daily at 12 noon.   . metFORMIN (GLUCOPHAGE) 500 MG tablet Take 1 tablet (500 mg total) by mouth 2 (two) times daily with a meal.  . nitroGLYCERIN (NITROSTAT) 0.4 MG SL tablet Place 1 tablet (0.4 mg total) under the tongue every 5 (  five) minutes as needed.  . NP THYROID 60 MG tablet Take 1 tablet (60 mg total) by mouth daily before breakfast.  . progesterone (PROMETRIUM) 200 MG capsule Take 2 capsules (400 mg total) by mouth daily.     [place] Office Visit from 06/09/2020 in [facility]  PHQ-9 Total Score 0      Objective:   Today's Vitals: BP 128/78   Pulse 86   Temp 97.7 F (36.5 C) (Temporal)   Ht 5\' 6"  (1.676 m)   Wt 171 lb 6.4 oz (77.7 kg)   LMP 03/30/2011   SpO2 93%   BMI 27.66 kg/m  Vitals with BMI 06/09/2020 03/11/2020 02/20/2020  Height 5\' 6"  5\' 6"  5\' 6"   Weight 171 lbs 6 oz 173 lbs 13 oz 173 lbs  BMI 27.68 30.16 01.09  Systolic 323 557 322   Diastolic 78 80 86  Pulse 86 92 -     Physical Exam   She looks systemically well.  She has lost a couple of pounds since last visit.  Blood pressure is acceptable.    Assessment   1. Female hypogonadism syndrome   2. PCOS (polycystic ovarian syndrome)   3. Type 2 diabetes mellitus without complication, without long-term current use of insulin ([place])   4. Encounter for screening mammogram for malignant neoplasm of breast       Tests ordered Orders Placed This Encounter  Procedures  . MM 3D SCREEN BREAST BILATERAL  . COMPLETE METABOLIC PANEL WITH GFR  . Hemoglobin A1c  . Estradiol  . Progesterone  . 17-Hydroxyprogesterone     Plan: 1. Continue with estradiol and progesterone and we will check levels today. 2. Continue to focus on nutrition for her PCOS and ongoing to help with insulin resistance by starting her on NP thyroid after shared decision making.  I explained possible side effects of the desiccated thyroid. 3. Continue with metformin for her diabetes.  She was not able to tolerate Rybelsus in the past.  We may want to try Ozempic in the future although her hemoglobin A1c was 7.5% which may be improved by nutrition alone as well as optimizing thyroid and estradiol, which will both reduce visceral fat. 4. I will check 17 hydroxyprogesterone level in case she has an adrenal tumor. 5. I will also order a mammogram for screening purposes. 6. Follow-up with me in 3 months for an annual physical exam and further recommendations will depend on blood results   Meds ordered this encounter  Medications  . NP THYROID 60 MG tablet    Sig: Take 1 tablet (60 mg total) by mouth daily before breakfast.    Dispense:  30 tablet    Refill:  3    Donnalyn Juran Luther Parody, MD

## 2020-06-12 ENCOUNTER — Other Ambulatory Visit (INDEPENDENT_AMBULATORY_CARE_PROVIDER_SITE_OTHER): Payer: Self-pay | Admitting: Internal Medicine

## 2020-06-12 LAB — COMPLETE METABOLIC PANEL WITH GFR
AG Ratio: 2 (calc) (ref 1.0–2.5)
ALT: 22 U/L (ref 6–29)
AST: 13 U/L (ref 10–35)
Albumin: 4.3 g/dL (ref 3.6–5.1)
Alkaline phosphatase (APISO): 50 U/L (ref 37–153)
BUN: 14 mg/dL (ref 7–25)
CO2: 25 mmol/L (ref 20–32)
Calcium: 9.8 mg/dL (ref 8.6–10.4)
Chloride: 105 mmol/L (ref 98–110)
Creat: 0.62 mg/dL (ref 0.50–1.05)
GFR, Est African American: 120 mL/min/{1.73_m2} (ref >=60)
GFR, Est Non African American: 104 mL/min/{1.73_m2} (ref >=60)
Globulin: 2.2 g/dL (calc) (ref 1.9–3.7)
Glucose, Bld: 182 mg/dL — ABNORMAL HIGH (ref 65–139)
Potassium: 4 mmol/L (ref 3.5–5.3)
Sodium: 139 mmol/L (ref 135–146)
Total Bilirubin: 0.2 mg/dL (ref 0.2–1.2)
Total Protein: 6.5 g/dL (ref 6.1–8.1)

## 2020-06-12 LAB — HEMOGLOBIN A1C
Hgb A1c MFr Bld: 7.6 % of total Hgb — ABNORMAL HIGH (ref <5.7)
Mean Plasma Glucose: 171 mg/dL
eAG (mmol/L): 9.5 mmol/L

## 2020-06-12 LAB — PROGESTERONE: Progesterone: 10.7 ng/mL

## 2020-06-12 LAB — 17-HYDROXYPROGESTERONE: 17-OH-Progesterone, LC/MS/MS: 20 ng/dL

## 2020-06-12 LAB — ESTRADIOL: Estradiol: 33 pg/mL

## 2020-06-12 MED ORDER — NP THYROID 90 MG PO TABS
90.0000 mg | ORAL_TABLET | Freq: Every day | ORAL | 3 refills | Status: DC
Start: 2020-06-12 — End: 2021-03-24

## 2020-06-25 ENCOUNTER — Telehealth (INDEPENDENT_AMBULATORY_CARE_PROVIDER_SITE_OTHER): Payer: Self-pay | Admitting: Internal Medicine

## 2020-06-25 NOTE — Telephone Encounter (Signed)
Called patient and was not able to leave a voice message due to not having voice mail set up. I will try back at another time.

## 2020-06-25 NOTE — Telephone Encounter (Signed)
It looks like the appointment is for an acute visit with respiratory symptoms.  If she is COVID-negative, she should be examined and seen in the office.  If she does not want to do this, she will need to go to an urgent care.

## 2020-06-25 NOTE — Telephone Encounter (Signed)
Can you call this patient back to let her know this info.  I have tried several times and can't get an answer.

## 2020-06-26 ENCOUNTER — Other Ambulatory Visit: Payer: Self-pay

## 2020-06-26 ENCOUNTER — Encounter (INDEPENDENT_AMBULATORY_CARE_PROVIDER_SITE_OTHER): Payer: Self-pay | Admitting: Internal Medicine

## 2020-06-26 ENCOUNTER — Ambulatory Visit (INDEPENDENT_AMBULATORY_CARE_PROVIDER_SITE_OTHER): Payer: BC Managed Care – PPO | Admitting: Internal Medicine

## 2020-06-26 VITALS — BP 144/80 | HR 88 | Temp 97.9°F | Resp 18 | Ht 66.0 in | Wt 170.0 lb

## 2020-06-26 DIAGNOSIS — J40 Bronchitis, not specified as acute or chronic: Secondary | ICD-10-CM

## 2020-06-26 MED ORDER — AZITHROMYCIN 250 MG PO TABS: ORAL_TABLET | ORAL | 0 refills | Status: DC

## 2020-06-26 MED ORDER — PREDNISONE 20 MG PO TABS: 40.0000 mg | ORAL_TABLET | Freq: Every day | ORAL | 1 refills | Status: DC

## 2020-06-26 NOTE — Telephone Encounter (Signed)
Called patient and she stated that she was feeling slightly better and her Covid test was negative and she asked if she could come in earlier and I have moved her up to the 3:30pm. Patient verbalized an understanding.

## 2020-06-26 NOTE — Progress Notes (Signed)
Metrics: Intervention Frequency ACO  Documented Smoking Status Yearly  Screened one or more times in 24 months  Cessation Counseling or  Active cessation medication Past 24 months  Past 24 months   Guideline developer: UpToDate (See UpToDate for funding source) Date Released: 2014       Wellness Office Visit  Subjective:  Patient ID: Gwendolyn [name], female    DOB: May 04, 1965  Age: 55 y.o. MRN: [id number]  CC: Cough, wheezing HPI  This lady comes in for an acute visit with a 2-day history of the above symptoms.  The cough has been largely nonproductive but she has noticed wheezing.  She denies any fever.  There is some nasal congestion.  She did take a COVID-19 test today and it was negative.  She continues to smoke cigarettes.  She does have Advair inhaler which she has been taking regularly. Past Medical History:  Diagnosis Date  . Diabetes mellitus without complication ([place])   . History of TIAs   . Palpitations   . Polycystic ovarian disease   . Tobacco abuse    Past Surgical History:  Procedure Laterality Date  . TUBAL LIGATION       Family History  Problem Relation Age of Onset  . Hypertension Mother   . Diabetes Father   . Diabetes Brother     Social History   Social History Narrative   Exercises rarely. Smokes tobacco. 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 Eats meat fruit and vegetables. Married for 35 years,lives with husband.   Social History   Tobacco Use  . Smoking status: Current Every Day Smoker    Packs/day: 0.25    Years: 20.00    Pack years: 5.00  . Smokeless tobacco: Never Used  Substance Use Topics  . Alcohol use: Yes    Alcohol/week: 14.0 standard drinks    Types: 14 Glasses of wine per week    Current Meds  Medication Sig  . albuterol (PROAIR HFA) 108 (90 Base) MCG/ACT inhaler Inhale 2 puffs into the lungs every 6 (six) hours as needed.  [name] azithromycin (ZITHROMAX) 250 MG  tablet Take 2 tablets the first day and then 1 tablet every day for the next 4 days  . busPIRone (BUSPAR) 15 MG tablet Take 15-30 mg by mouth 2 (two) times daily.  . busPIRone (BUSPAR) 5 MG tablet Take 1 tablet (5 mg total) by mouth 3 (three) times daily.  . Cholecalciferol (VITAMIN D3) 125 MCG (5000 UT) TABS Take 2 tablets by mouth daily.  . diazepam (VALIUM) 5 MG tablet Take 5 mg by mouth daily as needed.  [name] escitalopram (LEXAPRO) 10 MG tablet Take 1 tablet (10 mg total) by mouth daily.  [name] estradiol (ESTRACE) 1 MG tablet Take 1 tablet (1 mg total) by mouth daily.  . fluticasone-salmeterol (ADVAIR HFA) 45-21 MCG/ACT inhaler Inhale 2 puffs into the lungs 2 (two) times daily.  [name] LINZESS 72 MCG capsule Take 1 capsule by mouth daily at 12 noon.   . metFORMIN (GLUCOPHAGE) 500 MG tablet Take 1 tablet (500 mg total) by mouth 2 (two) times daily with a meal.  . nitroGLYCERIN (NITROSTAT) 0.4 MG SL tablet Place 1 tablet (0.4 mg total) under the tongue every 5 (five) minutes as needed.  . NP THYROID 90 MG tablet Take 1 tablet (90 mg total) by mouth daily.  . predniSONE (DELTASONE) 20 MG tablet Take 2 tablets (40 mg total) by mouth daily with breakfast.  . progesterone (PROMETRIUM)  200 MG capsule Take 2 capsules (400 mg total) by mouth daily.     [place] Office Visit from 06/09/2020 in [facility]  PHQ-9 Total Score 0      Objective:   Today's Vitals: BP (!) 144/80 (BP Location: Left Arm, Patient Position: Sitting, Cuff Size: Normal)   Pulse 88   Temp 97.9 F (36.6 C)   Resp 18   Ht 5\' 6"  (1.676 m)   Wt 170 lb (77.1 kg)   LMP 03/30/2011   SpO2 99%   BMI 27.44 kg/m  Vitals with BMI 06/26/2020 06/09/2020 03/11/2020  Height 5\' 6"  5\' 6"  5\' 6"   Weight 170 lbs 171 lbs 6 oz 173 lbs 13 oz  BMI 27.45 34.74 25.95  Systolic 638 756 433  Diastolic 80 78 80  Pulse 88 86 92     Physical Exam  There is no respiratory distress.  Oxygen saturations on room air are excellent at 99%.   However, she does have bilateral wheezing and her chest is not tight.  There are no crackles or bronchial breathing.     Assessment   1. Bronchitis       Tests ordered No orders of the defined types were placed in this encounter.    Plan: 1. I will treat her as bronchitis and steroids will help her probably the most but she may need antibiotics and I have sent a prescription for Zithromax to her pharmacy. 2. If she does not improve, in the next few days, she will let us know   Meds ordered this encounter  Medications  . azithromycin (ZITHROMAX) 250 MG tablet    Sig: Take 2 tablets the first day and then 1 tablet every day for the next 4 days    Dispense:  6 tablet    Refill:  0  . predniSONE (DELTASONE) 20 MG tablet    Sig: Take 2 tablets (40 mg total) by mouth daily with breakfast.    Dispense:  10 tablet    Refill:  1    Rosalena Mccorry Luther Parody, MD

## 2020-08-12 ENCOUNTER — Encounter (INDEPENDENT_AMBULATORY_CARE_PROVIDER_SITE_OTHER): Payer: Self-pay | Admitting: Internal Medicine

## 2020-08-13 ENCOUNTER — Other Ambulatory Visit (INDEPENDENT_AMBULATORY_CARE_PROVIDER_SITE_OTHER): Payer: Self-pay | Admitting: Internal Medicine

## 2020-08-13 MED ORDER — EPINEPHRINE 0.3 MG/0.3ML IJ SOAJ: 0.3000 mg | INTRAMUSCULAR | 1 refills | Status: AC | PRN

## 2020-09-03 ENCOUNTER — Encounter (INDEPENDENT_AMBULATORY_CARE_PROVIDER_SITE_OTHER): Payer: BC Managed Care – PPO | Admitting: Internal Medicine

## 2020-09-17 ENCOUNTER — Other Ambulatory Visit (INDEPENDENT_AMBULATORY_CARE_PROVIDER_SITE_OTHER): Payer: Self-pay | Admitting: Internal Medicine

## 2020-10-21 DIAGNOSIS — E039 Hypothyroidism, unspecified: Secondary | ICD-10-CM | POA: Insufficient documentation

## 2020-10-22 LAB — HM PAP SMEAR

## 2021-02-19 ENCOUNTER — Encounter: Payer: Self-pay | Admitting: Internal Medicine

## 2021-02-19 ENCOUNTER — Ambulatory Visit: Payer: 59 | Admitting: Internal Medicine

## 2021-02-19 ENCOUNTER — Other Ambulatory Visit: Payer: Self-pay

## 2021-02-19 ENCOUNTER — Other Ambulatory Visit: Payer: Self-pay | Admitting: Internal Medicine

## 2021-02-19 VITALS — BP 138/82 | HR 91 | Resp 18 | Ht 66.0 in | Wt 165.1 lb

## 2021-02-19 DIAGNOSIS — E1169 Type 2 diabetes mellitus with other specified complication: Secondary | ICD-10-CM

## 2021-02-19 DIAGNOSIS — E039 Hypothyroidism, unspecified: Secondary | ICD-10-CM

## 2021-02-19 DIAGNOSIS — Z1159 Encounter for screening for other viral diseases: Secondary | ICD-10-CM | POA: Diagnosis not present

## 2021-02-19 DIAGNOSIS — J4530 Mild persistent asthma, uncomplicated: Secondary | ICD-10-CM | POA: Diagnosis not present

## 2021-02-19 DIAGNOSIS — I10 Essential (primary) hypertension: Secondary | ICD-10-CM

## 2021-02-19 DIAGNOSIS — Z720 Tobacco use: Secondary | ICD-10-CM

## 2021-02-19 DIAGNOSIS — Z1231 Encounter for screening mammogram for malignant neoplasm of breast: Secondary | ICD-10-CM

## 2021-02-19 DIAGNOSIS — J45909 Unspecified asthma, uncomplicated: Secondary | ICD-10-CM | POA: Insufficient documentation

## 2021-02-19 DIAGNOSIS — E119 Type 2 diabetes mellitus without complications: Secondary | ICD-10-CM | POA: Insufficient documentation

## 2021-02-19 MED ORDER — TIRZEPATIDE 2.5 MG/0.5ML ~~LOC~~ SOAJ: 2.5000 mg | SUBCUTANEOUS | 0 refills | Status: DC

## 2021-02-19 MED ORDER — VARENICLINE TARTRATE 0.5 MG PO TABS: ORAL_TABLET | ORAL | 0 refills | Status: AC

## 2021-02-19 MED ORDER — VARENICLINE TARTRATE 1 MG PO TABS: 1.0000 mg | ORAL_TABLET | Freq: Two times a day (BID) | ORAL | 2 refills | Status: DC

## 2021-02-19 MED ORDER — TELMISARTAN 20 MG PO TABS: 20.0000 mg | ORAL_TABLET | Freq: Every day | ORAL | 3 refills | Status: DC

## 2021-02-19 MED ORDER — TELMISARTAN 20 MG PO TABS: 20.0000 mg | ORAL_TABLET | Freq: Every day | ORAL | 0 refills | Status: DC

## 2021-02-19 MED ORDER — ALBUTEROL SULFATE HFA 108 (90 BASE) MCG/ACT IN AERS: 2.0000 | INHALATION_SPRAY | Freq: Four times a day (QID) | RESPIRATORY_TRACT | 6 refills | Status: DC | PRN

## 2021-02-19 MED ORDER — ADVAIR HFA 45-21 MCG/ACT IN AERO: 2.0000 | INHALATION_SPRAY | Freq: Two times a day (BID) | RESPIRATORY_TRACT | 5 refills | Status: DC

## 2021-02-19 MED ORDER — TRELEGY ELLIPTA 100-62.5-25 MCG/ACT IN AEPB: 1.0000 | INHALATION_SPRAY | Freq: Every day | RESPIRATORY_TRACT | 11 refills | Status: DC

## 2021-02-19 NOTE — Patient Instructions (Addendum)
Please start taking Telmisartan instead of Lisinopril.  Please continue to follow low carb and low salt diet and perform moderate exercise/walking at least 150 mins/week.  Please start using Mounjaro as prescribed.  Please continue to take other medications as prescribed.

## 2021-02-19 NOTE — Assessment & Plan Note (Signed)
Lab Results  Component Value Date   HGBA1C 7.6 (H) 06/09/2020    On Metformin Was not able to get Mounjaro due to insurance coverage concern, new prescription and coupon provided Advised to follow diabetic diet On ARB now F/u CMP and lipid panel later Diabetic foot exam: Today Diabetic eye exam: Advised to follow up with Ophthalmology for diabetic eye exam

## 2021-02-19 NOTE — Assessment & Plan Note (Signed)
Smokes about 0.25 pack/day  Asked about quitting: confirms that he/she currently smokes cigarettes Advise to quit smoking: Educated about QUITTING to reduce the risk of cancer, cardio and cerebrovascular disease. Assess willingness: Unwilling to quit at this time, but is working on cutting back. Assist with counseling and pharmacotherapy: Counseled for 5 minutes and literature provided. Started Chantix. Arrange for follow up: follow up in 3 months and continue to offer help.

## 2021-02-19 NOTE — Assessment & Plan Note (Signed)
Lab Results  Component Value Date   TSH 1.25 03/11/2020   On NP thyroid 90 mg QD Chart review suggests normal TSH in the past as well Discussed about NP thyroid and Levothyroxine, she wants to continue NP thyroid for now.

## 2021-02-19 NOTE — Progress Notes (Signed)
New Patient Office Visit  Subjective:  Patient ID: Gwendolyn [name], female    DOB: 03-07-1965  Age: 56 y.o. MRN: [id number]  CC:  Chief Complaint  Patient presents with   New Patient (Initial Visit)    New patient was seeing gosrani pt would like to discuss bp medication feels like her medication is not working     HPI Gwendolyn [name] is a 56 y.o. female with past medical history of HTN, asthma, type II DM, hypothyroidism and tobacco abuse who presents for establishing care.  HTN: She was recently started on lisinopril for HTN.  She has been having chronic dry cough since starting lisinopril.  Her BP is well controlled now.  She denies any headache, dizziness, chest pain or palpitations.  Asthma: Well controlled with Advair and as needed albuterol.  She denies any dyspnea or wheezing currently.  Type II DM: She is taking metformin currently.  She used to take Byetta in the past.  Her OB/GYN tried to give her Darcel Bayley, but she was not able to get it from the pharmacy due to insurance coverage issue.  She has recently changed insurance and pharmacy and agrees to get a new prescription, coupon provided today.  She denies any polyuria or polydipsia currently.  She has been taking NP thyroid, although chart review suggests normal TSH in the past as well.  She states that she has lost about 10 pounds with NP thyroid and prefers to continue it for now.  She smokes about 3 to 5 cigarettes/day and wants medical help for smoking cessation.  She has had 2 doses of COVID vaccine.  Past Medical History:  Diagnosis Date   Diabetes mellitus without complication ([place])    History of TIAs    Palpitations    Polycystic ovarian disease    Tobacco abuse     Past Surgical History:  Procedure Laterality Date   TUBAL LIGATION      Family History  Problem Relation Age of Onset   Hypertension Mother    Diabetes Father    Diabetes Brother     Social History   Socioeconomic History    Marital status: Married    Spouse name: Not on file   Number of children: Not on file   Years of education: Not on file   Highest education level: Not on file  Occupational History   Not on file  Tobacco Use   Smoking status: Every Day    Packs/day: 0.25    Years: 20.00    Pack years: 5.00    Types: Cigarettes   Smokeless tobacco: Never  Vaping Use   Vaping Use: Former  Substance and Sexual Activity   Alcohol use: Yes    Alcohol/week: 14.0 standard drinks    Types: 14 Glasses of wine per week   Drug use: No   Sexual activity: Not Currently    Birth control/protection: Post-menopausal  Other Topics Concern   Not on file  Social History Narrative   Exercises rarely. Smokes tobacco. Works in a [employer]. Lives in [place]. Eats meat fruit and vegetables. Married for 74 years,lives with husband.   Social Determinants of Health   Financial Resource Strain: Not on file  Food Insecurity: Not on file  Transportation Needs: Not on file  Physical Activity: Not on file  Stress: Not on file  Social Connections: Not on file  Intimate Partner Violence: Not on file    ROS Review of Systems  Constitutional:  Positive for fatigue. Negative for chills and fever.  HENT:  Negative for congestion, sinus pressure, sinus pain and sore throat.   Eyes:  Negative for pain and discharge.  Respiratory:  Positive for cough. Negative for shortness of breath.   Cardiovascular:  Negative for chest pain and palpitations.  Gastrointestinal:  Negative for abdominal pain, diarrhea, nausea and vomiting.  Endocrine: Negative for polydipsia and polyuria.  Genitourinary:  Negative for dysuria and hematuria.  Musculoskeletal:  Negative for neck pain and neck stiffness.  Skin:  Negative for rash.  Neurological:  Negative for dizziness and weakness.  Psychiatric/Behavioral:  Negative for agitation and behavioral problems.    Objective:   Today's  Vitals: BP 138/82 (BP Location: Left Arm, Patient Position: Sitting, Cuff Size: Normal)    Pulse 91    Resp 18    Ht 5\' 6"  (1.676 m)    Wt 165 lb 1.9 oz (74.9 kg)    LMP 03/30/2011    SpO2 94%    BMI 26.65 kg/m   Physical Exam Vitals reviewed.  Constitutional:      General: She is not in acute distress.    Appearance: She is not diaphoretic.  HENT:     Head: Normocephalic and atraumatic.     Nose: Nose normal.     Mouth/Throat:     Mouth: Mucous membranes are moist.  Eyes:     General: No scleral icterus.    Extraocular Movements: Extraocular movements intact.  Cardiovascular:     Rate and Rhythm: Normal rate and regular rhythm.     Pulses: Normal pulses.     Heart sounds: Normal heart sounds. No murmur heard. Pulmonary:     Breath sounds: Normal breath sounds. No wheezing or rales.  Musculoskeletal:     Cervical back: Neck supple. No tenderness.     Right lower leg: No edema.     Left lower leg: No edema.  Skin:    General: Skin is warm.     Findings: No rash.  Neurological:     General: No focal deficit present.     Mental Status: She is alert and oriented to person, place, and time.     Sensory: No sensory deficit.     Motor: No weakness.  Psychiatric:        Mood and Affect: Mood normal.        Behavior: Behavior normal.    Assessment & Plan:   Problem List Items Addressed This Visit       Cardiovascular and Mediastinum   Essential hypertension    BP Readings from Last 1 Encounters:  02/19/21 138/82  Well-controlled with Lisinopril, but has chronic cough since starting it Switched to Telmisartan 20 mg QD Counseled for compliance with the medications Advised DASH diet and moderate exercise/walking, at least 150 mins/week       Relevant Medications   telmisartan (MICARDIS) 20 MG tablet     Respiratory   Asthma - Primary    Well-controlled with Advair and PRN Albuterol Due to insurance coverage issue, have to change to Trelegy today      Relevant  Medications   albuterol (PROAIR HFA) 108 (90 Base) MCG/ACT inhaler   Fluticasone-Umeclidin-Vilant (TRELEGY ELLIPTA) 100-62.5-25 MCG/ACT AEPB     Endocrine   Hypothyroidism    Lab Results  Component Value Date   TSH 1.25 03/11/2020  On NP thyroid 90 mg QD Chart review suggests normal TSH in the past as well Discussed about NP thyroid and  Levothyroxine, she wants to continue NP thyroid for now.      Relevant Orders   TSH + free T4   Type 2 diabetes mellitus (HCC)    Lab Results  Component Value Date   HGBA1C 7.6 (H) 06/09/2020   On Metformin Was not able to get Mounjaro due to insurance coverage concern, new prescription and coupon provided Advised to follow diabetic diet On ARB now F/u CMP and lipid panel later Diabetic foot exam: Today Diabetic eye exam: Advised to follow up with Ophthalmology for diabetic eye exam      Relevant Medications   tirzepatide (MOUNJARO) 2.5 MG/0.5ML Pen   telmisartan (MICARDIS) 20 MG tablet   Other Relevant Orders   Basic Metabolic Panel (BMET)   Hemoglobin A1c     Other   Tobacco abuse    Smokes about 0.25 pack/day  Asked about quitting: confirms that he/she currently smokes cigarettes Advise to quit smoking: Educated about QUITTING to reduce the risk of cancer, cardio and cerebrovascular disease. Assess willingness: Unwilling to quit at this time, but is working on cutting back. Assist with counseling and pharmacotherapy: Counseled for 5 minutes and literature provided. Started Chantix. Arrange for follow up: follow up in 3 months and continue to offer help.      Relevant Medications   varenicline (CHANTIX) 0.5 MG tablet   varenicline (CHANTIX CONTINUING MONTH PAK) 1 MG tablet (Start on 02/26/2021)   Other Visit Diagnoses     Screening mammogram for breast cancer       Relevant Orders   MM 3D SCREEN BREAST BILATERAL   Need for hepatitis C screening test       Relevant Orders   Hepatitis C Antibody       Outpatient Encounter  Medications as of 02/19/2021  Medication Sig   Cholecalciferol (VITAMIN D3) 125 MCG (5000 UT) TABS Take 2 tablets by mouth daily.   EPINEPHrine (EPIPEN 2-PAK) 0.3 mg/0.3 mL IJ SOAJ injection Inject 0.3 mg into the muscle as needed for anaphylaxis.   escitalopram (LEXAPRO) 20 MG tablet Take 20 mg by mouth daily.   estradiol (ESTRACE) 1 MG tablet Take 1 tablet (1 mg total) by mouth daily.   Fluticasone-Umeclidin-Vilant (TRELEGY ELLIPTA) 100-62.5-25 MCG/ACT AEPB Inhale 1 puff into the lungs daily.   metFORMIN (GLUCOPHAGE) 500 MG tablet TAKE 1 TABLET(500 MG) BY MOUTH TWICE DAILY WITH A MEAL   nitroGLYCERIN (NITROSTAT) 0.4 MG SL tablet Place 1 tablet (0.4 mg total) under the tongue every 5 (five) minutes as needed.   NP THYROID 90 MG tablet Take 1 tablet (90 mg total) by mouth daily.   progesterone (PROMETRIUM) 200 MG capsule Take 2 capsules (400 mg total) by mouth daily.   tirzepatide [facility]) 2.5 MG/0.5ML Pen Inject 2.5 mg into the skin once a week.   [START ON 02/26/2021] varenicline (CHANTIX CONTINUING MONTH PAK) 1 MG tablet Take 1 tablet (1 mg total) by mouth 2 (two) times daily.   varenicline (CHANTIX) 0.5 MG tablet Take 1 tablet (0.5 mg total) by mouth daily for 3 days, THEN 1 tablet (0.5 mg total) 2 (two) times daily for 4 days.   [DISCONTINUED] albuterol (PROAIR HFA) 108 (90 Base) MCG/ACT inhaler Inhale 2 puffs into the lungs every 6 (six) hours as needed.   [DISCONTINUED] lisinopril (ZESTRIL) 10 MG tablet lisinopril 10 mg tablet  TAKE 1 TABLET BY MOUTH EVERY DAY   [DISCONTINUED] telmisartan (MICARDIS) 20 MG tablet Take 1 tablet (20 mg total) by mouth daily.   albuterol (PROAIR  HFA) 108 (90 Base) MCG/ACT inhaler Inhale 2 puffs into the lungs every 6 (six) hours as needed.   telmisartan (MICARDIS) 20 MG tablet Take 1 tablet (20 mg total) by mouth daily.   [DISCONTINUED] azithromycin (ZITHROMAX) 250 MG tablet Take 2 tablets the first day and then 1 tablet every day for the next 4 days (Patient  not taking: Reported on 02/19/2021)   [DISCONTINUED] busPIRone (BUSPAR) 15 MG tablet Take 15-30 mg by mouth 2 (two) times daily. (Patient not taking: Reported on 02/19/2021)   [DISCONTINUED] busPIRone (BUSPAR) 5 MG tablet Take 1 tablet (5 mg total) by mouth 3 (three) times daily. (Patient not taking: Reported on 02/19/2021)   [DISCONTINUED] diazepam (VALIUM) 5 MG tablet Take 5 mg by mouth daily as needed. (Patient not taking: Reported on 02/19/2021)   [DISCONTINUED] escitalopram (LEXAPRO) 10 MG tablet Take 1 tablet (10 mg total) by mouth daily. (Patient not taking: Reported on 02/19/2021)   [DISCONTINUED] fluticasone-salmeterol (ADVAIR HFA) 45-21 MCG/ACT inhaler Inhale 2 puffs into the lungs 2 (two) times daily. (Patient not taking: Reported on 02/19/2021)   [DISCONTINUED] fluticasone-salmeterol (ADVAIR HFA) 45-21 MCG/ACT inhaler Inhale 2 puffs into the lungs 2 (two) times daily.   [DISCONTINUED] LINZESS 72 MCG capsule Take 1 capsule by mouth daily at 12 noon.  (Patient not taking: Reported on 02/19/2021)   [DISCONTINUED] predniSONE (DELTASONE) 20 MG tablet Take 2 tablets (40 mg total) by mouth daily with breakfast. (Patient not taking: Reported on 02/19/2021)   No facility-administered encounter medications on file as of 02/19/2021.    Follow-up: Return in about 3 months (around 05/20/2021) for HTN and DM.   Lindell Spar, MD

## 2021-02-19 NOTE — Assessment & Plan Note (Signed)
BP Readings from Last 1 Encounters:  02/19/21 138/82   Well-controlled with Lisinopril, but has chronic cough since starting it Switched to Telmisartan 20 mg QD Counseled for compliance with the medications Advised DASH diet and moderate exercise/walking, at least 150 mins/week

## 2021-02-19 NOTE — Assessment & Plan Note (Signed)
Well-controlled with Advair and PRN Albuterol Due to insurance coverage issue, have to change to Trelegy today

## 2021-02-20 LAB — HEPATITIS C ANTIBODY: Hep C Virus Ab: <0.1 s/co ratio (ref 0.0–0.9)

## 2021-02-20 LAB — BASIC METABOLIC PANEL
BUN/Creatinine Ratio: 29 — ABNORMAL HIGH (ref 9–23)
BUN: 15 mg/dL (ref 6–24)
CO2: 22 mmol/L (ref 20–29)
Calcium: 9.8 mg/dL (ref 8.7–10.2)
Chloride: 100 mmol/L (ref 96–106)
Creatinine, Ser: 0.51 mg/dL — ABNORMAL LOW (ref 0.57–1.00)
Glucose: 121 mg/dL — ABNORMAL HIGH (ref 70–99)
Potassium: 4.5 mmol/L (ref 3.5–5.2)
Sodium: 137 mmol/L (ref 134–144)
eGFR: 112 mL/min/{1.73_m2} (ref >59)

## 2021-02-20 LAB — TSH+FREE T4
Free T4: 1.07 ng/dL (ref 0.82–1.77)
TSH: 0.828 u[IU]/mL (ref 0.450–4.500)

## 2021-02-20 LAB — HEMOGLOBIN A1C
Est. average glucose Bld gHb Est-mCnc: 171 mg/dL
Hgb A1c MFr Bld: 7.6 % — ABNORMAL HIGH (ref 4.8–5.6)

## 2021-03-24 ENCOUNTER — Telehealth: Payer: Self-pay

## 2021-03-24 ENCOUNTER — Other Ambulatory Visit: Payer: Self-pay

## 2021-03-24 MED ORDER — METFORMIN HCL 500 MG PO TABS: ORAL_TABLET | ORAL | 0 refills | Status: DC

## 2021-03-24 MED ORDER — NP THYROID 90 MG PO TABS: 90.0000 mg | ORAL_TABLET | Freq: Every day | ORAL | 3 refills | Status: DC

## 2021-03-24 NOTE — Telephone Encounter (Signed)
Patient called need med refill  metFORMIN (GLUCOPHAGE) 500 MG tablet  NP THYROID 90 MG tablet   Pharmacy: [facility]

## 2021-03-24 NOTE — Telephone Encounter (Signed)
Refills sent

## 2021-03-31 ENCOUNTER — Other Ambulatory Visit: Payer: Self-pay | Admitting: *Deleted

## 2021-03-31 ENCOUNTER — Telehealth: Payer: Self-pay | Admitting: Internal Medicine

## 2021-03-31 MED ORDER — NP THYROID 90 MG PO TABS: 90.0000 mg | ORAL_TABLET | Freq: Every day | ORAL | 3 refills | Status: DC

## 2021-03-31 MED ORDER — METFORMIN HCL 500 MG PO TABS: ORAL_TABLET | ORAL | 0 refills | Status: DC

## 2021-03-31 NOTE — Telephone Encounter (Signed)
Pt [facility] For All new meds and refills   Pt needs refill on   metFORMIN (GLUCOPHAGE) 500 MG tablet   NP THYROID 90 MG tablet

## 2021-03-31 NOTE — Telephone Encounter (Signed)
[facility] [address]

## 2021-05-13 DIAGNOSIS — N951 Menopausal and female climacteric states: Secondary | ICD-10-CM | POA: Diagnosis not present

## 2021-05-13 DIAGNOSIS — N950 Postmenopausal bleeding: Secondary | ICD-10-CM | POA: Diagnosis not present

## 2021-05-14 DIAGNOSIS — F410 Panic disorder [episodic paroxysmal anxiety] without agoraphobia: Secondary | ICD-10-CM | POA: Diagnosis not present

## 2021-05-14 DIAGNOSIS — R69 Illness, unspecified: Secondary | ICD-10-CM | POA: Diagnosis not present

## 2021-05-21 ENCOUNTER — Ambulatory Visit: Payer: 59 | Admitting: Internal Medicine

## 2021-05-21 LAB — HM DIABETES EYE EXAM

## 2021-06-02 ENCOUNTER — Ambulatory Visit: Payer: 59 | Admitting: Internal Medicine

## 2021-06-02 ENCOUNTER — Encounter: Payer: Self-pay | Admitting: Internal Medicine

## 2021-06-02 ENCOUNTER — Encounter: Payer: 59 | Admitting: Internal Medicine

## 2021-06-02 DIAGNOSIS — K529 Noninfective gastroenteritis and colitis, unspecified: Secondary | ICD-10-CM

## 2021-06-02 DIAGNOSIS — K589 Irritable bowel syndrome without diarrhea: Secondary | ICD-10-CM

## 2021-06-02 MED ORDER — AMOXICILLIN-POT CLAVULANATE 875-125 MG PO TABS: 1.0000 | ORAL_TABLET | Freq: Two times a day (BID) | ORAL | 0 refills | Status: DC

## 2021-06-02 MED ORDER — DICYCLOMINE HCL 10 MG PO CAPS: 10.0000 mg | ORAL_CAPSULE | Freq: Three times a day (TID) | ORAL | 0 refills | Status: DC | PRN

## 2021-06-02 NOTE — Patient Instructions (Addendum)
Please take Augmentin as prescribed for possible gastroenteritis. ? ?Please take Bentyl as prescribed for abdominal cramps. ?

## 2021-06-02 NOTE — Progress Notes (Signed)
?  ? ?Virtual Visit via Telephone Note  ? ?This visit type was conducted due to national recommendations for restrictions regarding the COVID-19 Pandemic (e.g. social distancing) in an effort to limit this patient's exposure and mitigate transmission in our community.  Due to her co-morbid illnesses, this patient is at least at moderate risk for complications without adequate follow up.  This format is felt to be most appropriate for this patient at this time.  The patient did not have access to video technology/had technical difficulties with video requiring transitioning to audio format only (telephone).  All issues noted in this document were discussed and addressed.  No physical exam could be performed with this format. ? ?Evaluation Performed:  Follow-up visit ? ?Date:  06/02/2021  ? ?ID:  Gwendolyn [name], DOB [birth year], MRN [id number] ? ?Patient Location: Home ?Provider Location: Office/Clinic ? ?Participants: Patient ?Location of Patient: Home ?Location of Provider: Telehealth ?Consent was obtain for visit to be over via telehealth. ?I verified that I am speaking with the correct person using two identifiers. ? ?PCP:  Lindell Spar, MD  ? ?Chief Complaint: Stomach pain ? ?History of Present Illness:   ? ?Gwendolyn [name] is a 56 y.o. female who has a televisit for complaint of lower abdominal pain and cramps since yesterday.  She has also noticed mucoid discharge with her stool once, but denies any melena or hematochezia currently.  Denies any fever or chills currently.  She has a history of IBS.  She has regular BM currently, but reports pain while passing stool. ? ?The patient does not have symptoms concerning for COVID-19 infection (fever, chills, cough, or new shortness of breath).  ? ?Past Medical, Surgical, Social History, Allergies, and Medications have been Reviewed. ? ?Past Medical History:  ?Diagnosis Date  ? Diabetes mellitus without complication ([place])   ? History of TIAs   ? Palpitations   ?  Polycystic ovarian disease   ? Tobacco abuse   ? ?Past Surgical History:  ?Procedure Laterality Date  ? TUBAL LIGATION    ?  ? ?Current Meds  ?Medication Sig  ? ADVAIR HFA 45-21 MCG/ACT inhaler INHALE 2 PUFFS INTO THE LUNGS TWICE A DAY  ? albuterol (PROAIR HFA) 108 (90 Base) MCG/ACT inhaler Inhale 2 puffs into the lungs every 6 (six) hours as needed.  ? Cholecalciferol (VITAMIN D3) 125 MCG (5000 UT) TABS Take 2 tablets by mouth daily.  ? EPINEPHrine (EPIPEN 2-PAK) 0.3 mg/0.3 mL IJ SOAJ injection Inject 0.3 mg into the muscle as needed for anaphylaxis.  ? escitalopram (LEXAPRO) 20 MG tablet Take 20 mg by mouth daily.  ? estradiol (ESTRACE) 1 MG tablet Take 1 tablet (1 mg total) by mouth daily.  ? Fluticasone-Umeclidin-Vilant (TRELEGY ELLIPTA) 100-62.5-25 MCG/ACT AEPB Inhale 1 puff into the lungs daily.  ? metFORMIN (GLUCOPHAGE) 500 MG tablet TAKE 1 TABLET(500 MG) BY MOUTH TWICE DAILY WITH A MEAL  ? nitroGLYCERIN (NITROSTAT) 0.4 MG SL tablet Place 1 tablet (0.4 mg total) under the tongue every 5 (five) minutes as needed.  ? NP THYROID 90 MG tablet Take 1 tablet (90 mg total) by mouth daily.  ? progesterone (PROMETRIUM) 200 MG capsule Take 2 capsules (400 mg total) by mouth daily.  ? telmisartan (MICARDIS) 20 MG tablet Take 1 tablet (20 mg total) by mouth daily.  ? tirzepatide [facility]) 2.5 MG/0.5ML Pen Inject 2.5 mg into the skin once a week.  ? varenicline (CHANTIX CONTINUING MONTH PAK) 1 MG tablet Take 1 tablet (1 mg  total) by mouth 2 (two) times daily.  ?  ? ?Allergies:   Lidocaine and Other  ? ?ROS:   ?Please see the history of present illness.    ? ?All other systems reviewed and are negative. ? ? ?Labs/Other Tests and Data Reviewed:   ? ?Recent Labs: ?06/09/2020: ALT 22 ?02/19/2021: BUN 15; Creatinine, Ser 0.51; Potassium 4.5; Sodium 137; TSH 0.828  ? ?Recent Lipid Panel ?Lab Results  ?Component Value Date/Time  ? CHOL 165 09/03/2019 02:47 PM  ? TRIG 107 09/03/2019 02:47 PM  ? HDL 67 09/03/2019 02:47 PM  ? CHOLHDL  2.5 09/03/2019 02:47 PM  ? LDLCALC 79 09/03/2019 02:47 PM  ? ? ?Wt Readings from Last 3 Encounters:  ?02/19/21 165 lb 1.9 oz (74.9 kg)  ?06/26/20 170 lb (77.1 kg)  ?06/09/20 171 lb 6.4 oz (77.7 kg)  ?  ?ASSESSMENT & PLAN:   ? ?Gastroenteritis ?IBS ?Started empiric Augmentin for possible colitis ?Maintain adequate hydration ?Bentyl as needed for abdominal cramps, could be related to IBS ?If persistent symptoms, will get imaging and/or GI referral ? ?Time:   ?Today, I have spent 13 minutes reviewing the chart, including problem list, medications, and with the patient with telehealth technology discussing the above problems. ? ? ?Medication Adjustments/Labs and Tests Ordered: ?Current medicines are reviewed at length with the patient today.  Concerns regarding medicines are outlined above.  ? ?Tests Ordered: ?No orders of the defined types were placed in this encounter. ? ? ?Medication Changes: ?No orders of the defined types were placed in this encounter. ? ? ? ?Note: This dictation was prepared with Dragon dictation along with smaller phrase technology. Similar sounding words can be transcribed inadequately or may not be corrected upon review. Any transcriptional errors that result from this process are unintentional.  ?  ? ? ?Disposition:  Follow up  ?Signed, ?Lindell Spar, MD  ?06/02/2021 11:44 AM    ? ?[facility] ?[facility] ?

## 2021-06-03 ENCOUNTER — Encounter: Payer: Self-pay | Admitting: *Deleted

## 2021-06-03 NOTE — Progress Notes (Signed)
ERRONEOUS ENCOUNTER

## 2021-06-04 ENCOUNTER — Encounter: Payer: Self-pay | Admitting: Internal Medicine

## 2021-06-11 ENCOUNTER — Other Ambulatory Visit: Payer: Self-pay | Admitting: *Deleted

## 2021-06-11 ENCOUNTER — Encounter: Payer: Self-pay | Admitting: Internal Medicine

## 2021-06-11 ENCOUNTER — Encounter (INDEPENDENT_AMBULATORY_CARE_PROVIDER_SITE_OTHER): Payer: Self-pay | Admitting: *Deleted

## 2021-06-11 DIAGNOSIS — D225 Melanocytic nevi of trunk: Secondary | ICD-10-CM | POA: Diagnosis not present

## 2021-06-11 DIAGNOSIS — Z1283 Encounter for screening for malignant neoplasm of skin: Secondary | ICD-10-CM | POA: Diagnosis not present

## 2021-06-11 DIAGNOSIS — K529 Noninfective gastroenteritis and colitis, unspecified: Secondary | ICD-10-CM

## 2021-06-11 DIAGNOSIS — K589 Irritable bowel syndrome without diarrhea: Secondary | ICD-10-CM

## 2021-06-11 DIAGNOSIS — I820 Budd-Chiari syndrome: Secondary | ICD-10-CM | POA: Diagnosis not present

## 2021-06-30 ENCOUNTER — Ambulatory Visit: Payer: 59 | Admitting: Internal Medicine

## 2021-06-30 ENCOUNTER — Other Ambulatory Visit: Payer: Self-pay | Admitting: Internal Medicine

## 2021-06-30 ENCOUNTER — Encounter: Payer: Self-pay | Admitting: Internal Medicine

## 2021-06-30 VITALS — BP 126/72 | HR 92 | Ht 66.0 in | Wt 165.4 lb

## 2021-06-30 DIAGNOSIS — F17210 Nicotine dependence, cigarettes, uncomplicated: Secondary | ICD-10-CM

## 2021-06-30 DIAGNOSIS — R69 Illness, unspecified: Secondary | ICD-10-CM | POA: Diagnosis not present

## 2021-06-30 DIAGNOSIS — Z23 Encounter for immunization: Secondary | ICD-10-CM | POA: Diagnosis not present

## 2021-06-30 DIAGNOSIS — E039 Hypothyroidism, unspecified: Secondary | ICD-10-CM | POA: Diagnosis not present

## 2021-06-30 DIAGNOSIS — E1169 Type 2 diabetes mellitus with other specified complication: Secondary | ICD-10-CM

## 2021-06-30 DIAGNOSIS — I10 Essential (primary) hypertension: Secondary | ICD-10-CM | POA: Diagnosis not present

## 2021-06-30 DIAGNOSIS — Z720 Tobacco use: Secondary | ICD-10-CM

## 2021-06-30 DIAGNOSIS — J4530 Mild persistent asthma, uncomplicated: Secondary | ICD-10-CM

## 2021-06-30 LAB — POCT GLYCOSYLATED HEMOGLOBIN (HGB A1C): HbA1c POC (<> result, manual entry): 7.6 % (ref 4.0–5.6)

## 2021-06-30 MED ORDER — TELMISARTAN 20 MG PO TABS: 20.0000 mg | ORAL_TABLET | Freq: Every day | ORAL | 1 refills | Status: DC

## 2021-06-30 MED ORDER — TRULICITY 0.75 MG/0.5ML ~~LOC~~ SOAJ: 0.7500 mg | SUBCUTANEOUS | 0 refills | Status: DC

## 2021-06-30 NOTE — Assessment & Plan Note (Signed)
Lab Results  ?Component Value Date  ? HGBA1C 7.6 06/30/2021  ? ? ?On Metformin ?Was not able to get [facility] due to insurance coverage concern ?Started Trulicity instead ?Advised to follow diabetic diet ?On ARB now ?F/u CMP and lipid panel later ?Diabetic eye exam: Advised to follow up with Ophthalmology for diabetic eye exam ?

## 2021-06-30 NOTE — Patient Instructions (Signed)
Please start taking Trulicity as prescribed. ? ?Please continue to follow low carb diet and perform moderate exercise/walking at least 150 mins/week. ?

## 2021-06-30 NOTE — Assessment & Plan Note (Signed)
BP Readings from Last 1 Encounters:  ?06/30/21 126/72  ? ?Well-controlled with Telmisartan 20 mg QD ?Cough resolved now since stopping Lisinopril ?Counseled for compliance with the medications ?Advised DASH diet and moderate exercise/walking, at least 150 mins/week ? ?

## 2021-06-30 NOTE — Assessment & Plan Note (Signed)
Smokes about 0.25 pack/day ? ?Asked about quitting: confirms that he/she currently smokes cigarettes ?Advise to quit smoking: Educated about QUITTING to reduce the risk of cancer, cardio and cerebrovascular disease. ?Assess willingness: Unwilling to quit at this time, but is working on cutting back. ?Assist with counseling and pharmacotherapy: Counseled for 5 minutes and literature provided. Started Chantix. ?Arrange for follow up: follow up in 3 months and continue to offer help. ?

## 2021-06-30 NOTE — Assessment & Plan Note (Signed)
Lab Results  Component Value Date   TSH 0.828 02/19/2021   On NP thyroid 90 mg QD Chart review suggests normal TSH in the past as well Discussed about NP thyroid and Levothyroxine, she wants to continue NP thyroid for now. 

## 2021-06-30 NOTE — Assessment & Plan Note (Signed)
Well-controlled with Advair and PRN Albuterol 

## 2021-06-30 NOTE — Progress Notes (Signed)
? ?Established Patient Office Visit ? ?Subjective:  ?Patient ID: Gwendolyn [name], female    DOB: 19-Mar-1965  Age: 56 y.o. MRN: [id number] ? ?CC:  ?Chief Complaint  ?Patient presents with  ? Follow-up  ?  Pt would like to discuss different diabetes medication since her insurance would not cover what was prescribed last time.   ? ? ?HPI ?Gwendolyn [name] is a 56 y.o. female with past medical history of HTN, asthma, type II DM, hypothyroidism and tobacco abuse who presents for f/u of her chronic medical conditions. ? ?Type II DM: She was not able to get [facility] as it was not covered by her insurance.  She currently takes metformin.  Her HbA1c was stable at 7.6 today.  She could not tolerate Rybelsus in the past.  She denies any polyuria or polyphagia currently. ? ?Her diarrhea has resolved now with Augmentin.  She also reported mucus in stool at that time.  Denies any melena or hematochezia currently. ? ?HTN: BP is well-controlled. Takes medications regularly. Patient denies headache, dizziness, chest pain, dyspnea or palpitations. ? ?She received first dose of Shingrix vaccine today. ? ? ? ?Past Medical History:  ?Diagnosis Date  ? Diabetes mellitus without complication ([place])   ? History of TIAs   ? Palpitations   ? Polycystic ovarian disease   ? Tobacco abuse   ? ? ?Past Surgical History:  ?Procedure Laterality Date  ? TUBAL LIGATION    ? ? ?Family History  ?Problem Relation Age of Onset  ? Hypertension Mother   ? Diabetes Father   ? Diabetes Brother   ? ? ?Social History  ? ?Socioeconomic History  ? Marital status: Married  ?  Spouse name: Not on file  ? Number of children: Not on file  ? Years of education: Not on file  ? Highest education level: Not on file  ?Occupational History  ? Not on file  ?Tobacco Use  ? Smoking status: Every Day  ?  Packs/day: 0.25  ?  Years: 20.00  ?  Pack years: 5.00  ?  Types: Cigarettes  ? Smokeless tobacco: Never  ?Vaping Use  ? Vaping Use: Former  ?Substance and Sexual Activity  ?  Alcohol use: Yes  ?  Alcohol/week: 14.0 standard drinks  ?  Types: 14 Glasses of wine per week  ? Drug use: No  ? Sexual activity: Not Currently  ?  Birth control/protection: Post-menopausal  ?Other Topics Concern  ? Not on file  ?Social History Narrative  ? Exercises rarely. Smokes tobacco. Works in a Cytogeneticist. Lives in [place]. Eats meat fruit and vegetables. Married for 69 years,lives with husband.  ? ?Social Determinants of Health  ? ?Financial Resource Strain: Not on file  ?Food Insecurity: Not on file  ?Transportation Needs: Not on file  ?Physical Activity: Not on file  ?Stress: Not on file  ?Social Connections: Not on file  ?Intimate Partner Violence: Not on file  ? ? ?Outpatient Medications Prior to Visit  ?Medication Sig Dispense Refill  ? ADVAIR HFA 45-21 MCG/ACT inhaler INHALE 2 PUFFS INTO THE LUNGS TWICE A DAY 24 each 5  ? albuterol (PROAIR HFA) 108 (90 Base) MCG/ACT inhaler Inhale 2 puffs into the lungs every 6 (six) hours as needed. 18 g 6  ? Cholecalciferol (VITAMIN D3) 125 MCG (5000 UT) TABS Take 2 tablets by mouth daily.    ? dicyclomine (BENTYL) 10 MG capsule Take 1 capsule (10 mg total) by mouth 3 (  three) times daily as needed for spasms. 20 capsule 0  ? EPINEPHrine (EPIPEN 2-PAK) 0.3 mg/0.3 mL IJ SOAJ injection Inject 0.3 mg into the muscle as needed for anaphylaxis. 1 each 1  ? escitalopram (LEXAPRO) 20 MG tablet Take 20 mg by mouth daily.    ? estradiol (ESTRACE) 1 MG tablet Take 1 tablet (1 mg total) by mouth daily. 90 tablet 1  ? metFORMIN (GLUCOPHAGE) 500 MG tablet TAKE 1 TABLET(500 MG) BY MOUTH TWICE DAILY WITH A MEAL 180 tablet 0  ? nitroGLYCERIN (NITROSTAT) 0.4 MG SL tablet Place 1 tablet (0.4 mg total) under the tongue every 5 (five) minutes as needed. 25 tablet 3  ? NP THYROID 90 MG tablet Take 1 tablet (90 mg total) by mouth daily. 30 tablet 3  ? progesterone (PROMETRIUM) 200 MG capsule Take 2 capsules (400 mg total) by mouth  daily. 180 capsule 1  ? varenicline (CHANTIX CONTINUING MONTH PAK) 1 MG tablet Take 1 tablet (1 mg total) by mouth 2 (two) times daily. 60 tablet 2  ? amoxicillin-clavulanate (AUGMENTIN) 875-125 MG tablet Take 1 tablet by mouth 2 (two) times daily. 14 tablet 0  ? Fluticasone-Umeclidin-Vilant (TRELEGY ELLIPTA) 100-62.5-25 MCG/ACT AEPB Inhale 1 puff into the lungs daily. 1 each 11  ? telmisartan (MICARDIS) 20 MG tablet Take 1 tablet (20 mg total) by mouth daily. 90 tablet 0  ? tirzepatide (MOUNJARO) 2.5 MG/0.5ML Pen Inject 2.5 mg into the skin once a week. 2 mL 0  ? ?No facility-administered medications prior to visit.  ? ? ?Allergies  ?Allergen Reactions  ? Lidocaine Anaphylaxis, Other (See Comments) and Nausea And Vomiting  ? Other Hives and Rash  ?  SEA MOSS  ? ? ?ROS ?Review of Systems  ?Constitutional:  Positive for fatigue. Negative for chills and fever.  ?HENT:  Negative for congestion, sinus pressure, sinus pain and sore throat.   ?Eyes:  Negative for pain and discharge.  ?Respiratory:  Negative for cough and shortness of breath.   ?Cardiovascular:  Negative for chest pain and palpitations.  ?Gastrointestinal:  Negative for abdominal pain, diarrhea, nausea and vomiting.  ?Endocrine: Negative for polydipsia and polyuria.  ?Genitourinary:  Negative for dysuria and hematuria.  ?Musculoskeletal:  Negative for neck pain and neck stiffness.  ?Skin:  Negative for rash.  ?Neurological:  Negative for dizziness and weakness.  ?Psychiatric/Behavioral:  Negative for agitation and behavioral problems.   ? ?  ?Objective:  ?  ?Physical Exam ?Vitals reviewed.  ?Constitutional:   ?   General: She is not in acute distress. ?   Appearance: She is not diaphoretic.  ?HENT:  ?   Head: Normocephalic and atraumatic.  ?   Nose: Nose normal.  ?   Mouth/Throat:  ?   Mouth: Mucous membranes are moist.  ?Eyes:  ?   General: No scleral icterus. ?   Extraocular Movements: Extraocular movements intact.  ?Cardiovascular:  ?   Rate and Rhythm:  Normal rate and regular rhythm.  ?   Pulses: Normal pulses.  ?   Heart sounds: Normal heart sounds. No murmur heard. ?Pulmonary:  ?   Breath sounds: Normal breath sounds. No wheezing or rales.  ?Abdominal:  ?   Palpations: Abdomen is soft.  ?   Tenderness: There is no abdominal tenderness.  ?Musculoskeletal:  ?   Cervical back: Neck supple. No tenderness.  ?   Right lower leg: No edema.  ?   Left lower leg: No edema.  ?Skin: ?   General: Skin is  warm.  ?   Findings: No rash.  ?Neurological:  ?   General: No focal deficit present.  ?   Mental Status: She is alert and oriented to person, place, and time.  ?   Sensory: No sensory deficit.  ?   Motor: No weakness.  ?Psychiatric:     ?   Mood and Affect: Mood normal.     ?   Behavior: Behavior normal.  ? ? ?BP 126/72   Pulse 92   Ht 5' 6"  (1.676 m)   Wt 165 lb 6.4 oz (75 kg)   LMP 03/30/2011   SpO2 93%   BMI 26.70 kg/m?  ?Wt Readings from Last 3 Encounters:  ?06/30/21 165 lb 6.4 oz (75 kg)  ?02/19/21 165 lb 1.9 oz (74.9 kg)  ?06/26/20 170 lb (77.1 kg)  ? ? ?Lab Results  ?Component Value Date  ? TSH 0.828 02/19/2021  ? ?Lab Results  ?Component Value Date  ? WBC 6.7 09/03/2019  ? HGB 14.2 09/03/2019  ? HCT 41.5 09/03/2019  ? MCV 95.4 09/03/2019  ? PLT 235 09/03/2019  ? ?Lab Results  ?Component Value Date  ? NA 137 02/19/2021  ? K 4.5 02/19/2021  ? CO2 22 02/19/2021  ? GLUCOSE 121 (H) 02/19/2021  ? BUN 15 02/19/2021  ? CREATININE 0.51 (L) 02/19/2021  ? BILITOT 0.2 06/09/2020  ? AST 13 06/09/2020  ? ALT 22 06/09/2020  ? PROT 6.5 06/09/2020  ? CALCIUM 9.8 02/19/2021  ? EGFR 112 02/19/2021  ? GFR 105.90 04/22/2010  ? ?Lab Results  ?Component Value Date  ? CHOL 165 09/03/2019  ? ?Lab Results  ?Component Value Date  ? HDL 67 09/03/2019  ? ?Lab Results  ?Component Value Date  ? [place] 79 09/03/2019  ? ?Lab Results  ?Component Value Date  ? TRIG 107 09/03/2019  ? ?Lab Results  ?Component Value Date  ? CHOLHDL 2.5 09/03/2019  ? ?Lab Results  ?Component Value Date  ? HGBA1C  7.6 06/30/2021  ? ? ?  ?Assessment & Plan:  ? ?Problem List Items Addressed This Visit   ? ?  ? Cardiovascular and Mediastinum  ? Essential hypertension  ?  BP Readings from Last 1 Encounters:  ?06/30/21 126/

## 2021-07-04 ENCOUNTER — Other Ambulatory Visit: Payer: Self-pay | Admitting: Internal Medicine

## 2021-07-30 ENCOUNTER — Encounter: Payer: Self-pay | Admitting: Internal Medicine

## 2021-07-31 ENCOUNTER — Other Ambulatory Visit: Payer: Self-pay | Admitting: *Deleted

## 2021-07-31 DIAGNOSIS — E1169 Type 2 diabetes mellitus with other specified complication: Secondary | ICD-10-CM

## 2021-07-31 MED ORDER — TRULICITY 0.75 MG/0.5ML ~~LOC~~ SOAJ: 0.7500 mg | SUBCUTANEOUS | 0 refills | Status: DC

## 2021-08-10 ENCOUNTER — Ambulatory Visit (INDEPENDENT_AMBULATORY_CARE_PROVIDER_SITE_OTHER): Payer: 59 | Admitting: Gastroenterology

## 2021-08-10 ENCOUNTER — Encounter (INDEPENDENT_AMBULATORY_CARE_PROVIDER_SITE_OTHER): Payer: Self-pay | Admitting: Gastroenterology

## 2021-08-10 DIAGNOSIS — R1030 Lower abdominal pain, unspecified: Secondary | ICD-10-CM | POA: Diagnosis not present

## 2021-08-10 DIAGNOSIS — K589 Irritable bowel syndrome without diarrhea: Secondary | ICD-10-CM | POA: Insufficient documentation

## 2021-08-10 DIAGNOSIS — K582 Mixed irritable bowel syndrome: Secondary | ICD-10-CM

## 2021-08-10 DIAGNOSIS — R109 Unspecified abdominal pain: Secondary | ICD-10-CM | POA: Insufficient documentation

## 2021-08-10 MED ORDER — PEG 3350-KCL-NA BICARB-NACL 420 G PO SOLR: 4000.0000 mL | Freq: Once | ORAL | 0 refills | Status: AC

## 2021-08-10 MED ORDER — TRULANCE 3 MG PO TABS: 1.0000 | ORAL_TABLET | Freq: Every day | ORAL | 1 refills | Status: DC

## 2021-08-11 LAB — CELIAC DISEASE PANEL
(tTG) Ab, IgA: <1 U/mL
(tTG) Ab, IgG: <1 U/mL
Gliadin IgA: 7.9 U/mL
Gliadin IgG: <1 U/mL
Immunoglobulin A: 169 mg/dL (ref 47–310)

## 2021-08-11 LAB — C-REACTIVE PROTEIN: CRP: 0.4 mg/L

## 2021-09-03 ENCOUNTER — Ambulatory Visit (HOSPITAL_COMMUNITY)
Admission: RE | Admit: 2021-09-03 | Discharge: 2021-09-03 | Disposition: A | Discharge status: Home / Self Care | Payer: 59 | Source: Ambulatory Visit | Attending: Gastroenterology | Admitting: Gastroenterology

## 2021-09-03 DIAGNOSIS — N281 Cyst of kidney, acquired: Secondary | ICD-10-CM | POA: Diagnosis not present

## 2021-09-03 DIAGNOSIS — K760 Fatty (change of) liver, not elsewhere classified: Secondary | ICD-10-CM | POA: Diagnosis not present

## 2021-09-03 DIAGNOSIS — R1030 Lower abdominal pain, unspecified: Secondary | ICD-10-CM | POA: Insufficient documentation

## 2021-09-03 LAB — POCT I-STAT CREATININE: Creatinine, Ser: 0.5 mg/dL (ref 0.44–1.00)

## 2021-09-03 MED ORDER — IOHEXOL 300 MG/ML  SOLN
100.0000 mL | Freq: Once | INTRAMUSCULAR | Status: AC | PRN
  Administered 2021-09-03: 100 mL via INTRAVENOUS

## 2021-09-09 ENCOUNTER — Other Ambulatory Visit: Payer: Self-pay | Admitting: Internal Medicine

## 2021-09-09 DIAGNOSIS — E1169 Type 2 diabetes mellitus with other specified complication: Secondary | ICD-10-CM

## 2021-10-01 ENCOUNTER — Other Ambulatory Visit: Payer: Self-pay | Admitting: Internal Medicine

## 2021-10-07 ENCOUNTER — Telehealth: Payer: Self-pay

## 2021-10-07 NOTE — Telephone Encounter (Signed)
RX request received from [facility] Phone [phone].  Patient requested to start a new statin therapy.  Last ov 10/2020 @ [facility]. Patient does not have appointment scheduled for [facility] at [facility].   LM for pharmacy---patient needs to schedule office visit.

## 2021-10-25 ENCOUNTER — Other Ambulatory Visit: Payer: Self-pay | Admitting: Radiology

## 2021-11-01 ENCOUNTER — Other Ambulatory Visit: Payer: Self-pay | Admitting: Internal Medicine

## 2021-11-03 ENCOUNTER — Ambulatory Visit: Payer: 59 | Admitting: Internal Medicine

## 2021-11-03 ENCOUNTER — Encounter: Payer: Self-pay | Admitting: Internal Medicine

## 2021-11-03 VITALS — BP 130/70 | HR 89 | Ht 66.0 in | Wt 157.0 lb

## 2021-11-03 DIAGNOSIS — I10 Essential (primary) hypertension: Secondary | ICD-10-CM | POA: Diagnosis not present

## 2021-11-03 DIAGNOSIS — E1169 Type 2 diabetes mellitus with other specified complication: Secondary | ICD-10-CM

## 2021-11-03 DIAGNOSIS — E039 Hypothyroidism, unspecified: Secondary | ICD-10-CM | POA: Diagnosis not present

## 2021-11-03 DIAGNOSIS — J0100 Acute maxillary sinusitis, unspecified: Secondary | ICD-10-CM | POA: Diagnosis not present

## 2021-11-03 MED ORDER — AMOXICILLIN-POT CLAVULANATE 875-125 MG PO TABS: 1.0000 | ORAL_TABLET | Freq: Two times a day (BID) | ORAL | 0 refills | Status: DC

## 2021-11-03 MED ORDER — TRULICITY 1.5 MG/0.5ML ~~LOC~~ SOAJ: 1.5000 mg | SUBCUTANEOUS | 1 refills | Status: DC

## 2021-11-03 NOTE — Assessment & Plan Note (Signed)
BP Readings from Last 1 Encounters:  11/03/21 130/70   Well-controlled with Telmisartan 20 mg QD Counseled for compliance with the medications Advised DASH diet and moderate exercise/walking, at least 150 mins/week

## 2021-11-03 NOTE — Patient Instructions (Addendum)
Please start taking Trulicity 1.5 mg instead of 0.75 mg.  Please take Augmentin as prescribed for sinusitis.  Please continue taking other medications as prescribed.  Please continue to follow low carb diet and perform moderate exercise/walking at least 150 mins/week.

## 2021-11-03 NOTE — Progress Notes (Signed)
Established Patient Office Visit  Subjective:  Patient ID: Gwendolyn [name], female    DOB: 08-15-1965  Age: 56 y.o. MRN: [id number]  CC:  Chief Complaint  Patient presents with   Follow-up    Follow up     HPI Gwendolyn [name] is a 56 y.o. female with past medical history of HTN, asthma, type II DM, hypothyroidism and tobacco abuse who presents for f/u of her chronic medical conditions.  She complains of nasal congestion, postnasal drip and sinus pressure related headache for the last 1 week.  She denies any fever, chills, dyspnea or wheezing currently.  HTN: BP is well-controlled. Takes medications regularly. Patient denies headache, dizziness, chest pain, dyspnea or palpitations.  Type II DM: She currently takes metformin and Trulicity.  Her HbA1c was stable at 7.6 in 05/23. She denies any polyuria or polyphagia currently. She has been tolerating Trulicity well.  She does not want to take Shingrix vaccine today as she is feeling weak due to sinusitis.   Past Medical History:  Diagnosis Date   Diabetes mellitus without complication ([place])    History of TIAs    Palpitations    Polycystic ovarian disease    Tobacco abuse     Past Surgical History:  Procedure Laterality Date   TUBAL LIGATION      Family History  Problem Relation Age of Onset   Hypertension Mother    Diabetes Father    Diabetes Brother     Social History   Socioeconomic History   Marital status: Married    Spouse name: Not on file   Number of children: Not on file   Years of education: Not on file   Highest education level: Not on file  Occupational History   Not on file  Tobacco Use   Smoking status: Every Day    Packs/day: 0.25    Years: 20.00    Total pack years: 5.00    Types: Cigarettes   Smokeless tobacco: Never  Vaping Use   Vaping Use: Former  Substance and Sexual Activity   Alcohol use: Yes    Alcohol/week: 14.0 standard drinks of alcohol    Types: 14 Glasses of wine per week     Comment: 2-3 times per week   Drug use: No   Sexual activity: Not Currently    Birth control/protection: Post-menopausal  Other Topics Concern   Not on file  Social History Narrative   Exercises rarely. Smokes tobacco. 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 Eats meat fruit and vegetables. Married for 36 years,lives with husband.   Social Determinants of Health   Financial Resource Strain: Not on file  Food Insecurity: Not on file  Transportation Needs: Not on file  Physical Activity: Not on file  Stress: Not on file  Social Connections: Not on file  Intimate Partner Violence: Not on file    Outpatient Medications Prior to Visit  Medication Sig Dispense Refill   ADVAIR HFA 45-21 MCG/ACT inhaler INHALE 2 PUFFS INTO THE LUNGS TWICE A DAY 24 each 5   albuterol (PROAIR HFA) 108 (90 Base) MCG/ACT inhaler Inhale 2 puffs into the lungs every 6 (six) hours as needed. 18 g 6   dicyclomine (BENTYL) 10 MG capsule Take 1 capsule (10 mg total) by mouth 3 (three) times daily as needed for spasms. 20 capsule 0   EPINEPHrine (EPIPEN 2-PAK) 0.3 mg/0.3 mL IJ SOAJ injection Inject 0.3 mg into the muscle as needed for  anaphylaxis. 1 each 1   escitalopram (LEXAPRO) 20 MG tablet Take 20 mg by mouth daily.     estradiol (ESTRACE) 1 MG tablet Take 1 tablet (1 mg total) by mouth daily. (Patient taking differently: Take 1 mg by mouth daily. 0.5 daily.) 90 tablet 1   metFORMIN (GLUCOPHAGE) 500 MG tablet TAKE 1 TABLET(500 MG) BY MOUTH TWICE DAILY WITH A MEAL 180 tablet 0   nitroGLYCERIN (NITROSTAT) 0.4 MG SL tablet Place 1 tablet (0.4 mg total) under the tongue every 5 (five) minutes as needed. 25 tablet 3   NP THYROID 90 MG tablet TAKE 1 TABLET BY MOUTH EVERY DAY 90 tablet 1   Plecanatide (TRULANCE) 3 MG TABS Take 1 each by mouth daily. 90 tablet 1   progesterone (PROMETRIUM) 200 MG capsule Take 2 capsules (400 mg total) by mouth daily. 180 capsule  1   telmisartan (MICARDIS) 20 MG tablet Take 1 tablet (20 mg total) by mouth daily. 90 tablet 1   TRULICITY 6.38 GT/3.6IW SOPN INJECT 0.75 MG SUBCUTANEOUSLY ONE TIME PER WEEK 2 mL 2   No facility-administered medications prior to visit.    Allergies  Allergen Reactions   Lidocaine Anaphylaxis, Other (See Comments) and Nausea And Vomiting   Other Hives and Rash    SEA MOSS    ROS Review of Systems  Constitutional:  Positive for fatigue. Negative for chills and fever.  HENT:  Positive for congestion, postnasal drip, sinus pressure and sinus pain. Negative for sore throat.   Eyes:  Negative for pain and discharge.  Respiratory:  Negative for cough and shortness of breath.   Cardiovascular:  Negative for chest pain and palpitations.  Gastrointestinal:  Negative for abdominal pain, diarrhea, nausea and vomiting.  Endocrine: Negative for polydipsia and polyuria.  Genitourinary:  Negative for dysuria and hematuria.  Musculoskeletal:  Negative for neck pain and neck stiffness.  Skin:  Negative for rash.  Neurological:  Negative for dizziness and weakness.  Psychiatric/Behavioral:  Negative for agitation and behavioral problems.       Objective:    Physical Exam Vitals reviewed.  Constitutional:      General: She is not in acute distress.    Appearance: She is not diaphoretic.  HENT:     Head: Normocephalic and atraumatic.     Nose: Congestion present.     Right Sinus: Maxillary sinus tenderness present.     Left Sinus: Maxillary sinus tenderness present.     Mouth/Throat:     Mouth: Mucous membranes are moist.  Eyes:     General: No scleral icterus.    Extraocular Movements: Extraocular movements intact.  Cardiovascular:     Rate and Rhythm: Normal rate and regular rhythm.     Pulses: Normal pulses.     Heart sounds: Normal heart sounds. No murmur heard. Pulmonary:     Breath sounds: Normal breath sounds. No wheezing or rales.  Musculoskeletal:     Cervical back: Neck  supple. No tenderness.     Right lower leg: No edema.     Left lower leg: No edema.  Skin:    General: Skin is warm.     Findings: No rash.  Neurological:     General: No focal deficit present.     Mental Status: She is alert and oriented to person, place, and time.     Sensory: No sensory deficit.     Motor: No weakness.  Psychiatric:        Mood and Affect: Mood normal.  Behavior: Behavior normal.     BP 130/70 (BP Location: Left Arm, Patient Position: Sitting)   Pulse 89   Ht 5' 6"  (1.676 m)   Wt 157 lb (71.2 kg)   LMP 03/30/2011   SpO2 99%   BMI 25.34 kg/m  Wt Readings from Last 3 Encounters:  11/03/21 157 lb (71.2 kg)  08/10/21 163 lb 14.4 oz (74.3 kg)  06/30/21 165 lb 6.4 oz (75 kg)    Lab Results  Component Value Date   TSH 0.275 (L) 11/03/2021   Lab Results  Component Value Date   WBC 6.7 09/03/2019   HGB 14.2 09/03/2019   HCT 41.5 09/03/2019   MCV 95.4 09/03/2019   PLT 235 09/03/2019   Lab Results  Component Value Date   NA 136 11/03/2021   K 4.5 11/03/2021   CO2 23 11/03/2021   GLUCOSE 100 (H) 11/03/2021   BUN 9 11/03/2021   CREATININE 0.50 (L) 11/03/2021   BILITOT 0.2 06/09/2020   AST 13 06/09/2020   ALT 22 06/09/2020   PROT 6.5 06/09/2020   CALCIUM 9.5 11/03/2021   EGFR 111 11/03/2021   GFR 105.90 04/22/2010   Lab Results  Component Value Date   CHOL 165 09/03/2019   Lab Results  Component Value Date   HDL 67 09/03/2019   Lab Results  Component Value Date   LDLCALC 79 09/03/2019   Lab Results  Component Value Date   TRIG 107 09/03/2019   Lab Results  Component Value Date   CHOLHDL 2.5 09/03/2019   Lab Results  Component Value Date   HGBA1C 6.6 (H) 11/03/2021      Assessment & Plan:   Problem List Items Addressed This Visit       Cardiovascular and Mediastinum   Essential hypertension - Primary    BP Readings from Last 1 Encounters:  11/03/21 130/70  Well-controlled with Telmisartan 20 mg QD Counseled for  compliance with the medications Advised DASH diet and moderate exercise/walking, at least 150 mins/week        Endocrine   Hypothyroidism    Lab Results  Component Value Date   TSH 0.828 02/19/2021  On NP thyroid 90 mg QD Chart review suggests normal TSH in the past as well Discussed about NP thyroid and Levothyroxine, she wants to continue NP thyroid for now.      Type 2 diabetes mellitus ([place])    Lab Results  Component Value Date   HGBA1C 7.6 06/30/2021  Associated with HTN and HLD On Metformin and Trulicity Increased dose to 1.5 mg QD Advised to follow diabetic diet On ARB now F/u CMP and lipid panel later Diabetic eye exam: Advised to follow up with Ophthalmology for diabetic eye exam      Relevant Medications   Dulaglutide (TRULICITY) 1.5 XG/3.3PO SOPN   Other Visit Diagnoses     Acute non-recurrent maxillary sinusitis     Started Augmentin as she has persistent symptoms despite symptomatic treatment Nasal saline spray PRN for nasal congestion   Relevant Medications   amoxicillin-clavulanate (AUGMENTIN) 875-125 MG tablet       Meds ordered this encounter  Medications   Dulaglutide (TRULICITY) 1.5 IP/1.8FQ SOPN    Sig: Inject 1.5 mg into the skin once a week.    Dispense:  6 mL    Refill:  1   amoxicillin-clavulanate (AUGMENTIN) 875-125 MG tablet    Sig: Take 1 tablet by mouth 2 (two) times daily.    Dispense:  14 tablet  Refill:  0    Follow-up: Return in about 4 months (around 03/05/2022) for DM and hypothyroidism.    Lindell Spar, MD

## 2021-11-03 NOTE — Assessment & Plan Note (Signed)
Lab Results  Component Value Date   TSH 0.828 02/19/2021   On NP thyroid 90 mg QD Chart review suggests normal TSH in the past as well Discussed about NP thyroid and Levothyroxine, she wants to continue NP thyroid for now.

## 2021-11-03 NOTE — Assessment & Plan Note (Addendum)
Lab Results  Component Value Date   HGBA1C 7.6 06/30/2021   Associated with HTN and HLD On Metformin and Trulicity Increased dose to 1.5 mg QD Advised to follow diabetic diet On ARB now F/u CMP and lipid panel later Diabetic eye exam: Advised to follow up with Ophthalmology for diabetic eye exam

## 2021-11-04 LAB — HEMOGLOBIN A1C
Est. average glucose Bld gHb Est-mCnc: 143 mg/dL
Hgb A1c MFr Bld: 6.6 % — ABNORMAL HIGH (ref 4.8–5.6)

## 2021-11-04 LAB — TSH+FREE T4
Free T4: 0.97 ng/dL (ref 0.82–1.77)
TSH: 0.275 u[IU]/mL — ABNORMAL LOW (ref 0.450–4.500)

## 2021-11-04 LAB — BASIC METABOLIC PANEL
BUN/Creatinine Ratio: 18 (ref 9–23)
BUN: 9 mg/dL (ref 6–24)
CO2: 23 mmol/L (ref 20–29)
Calcium: 9.5 mg/dL (ref 8.7–10.2)
Chloride: 97 mmol/L (ref 96–106)
Creatinine, Ser: 0.5 mg/dL — ABNORMAL LOW (ref 0.57–1.00)
Glucose: 100 mg/dL — ABNORMAL HIGH (ref 70–99)
Potassium: 4.5 mmol/L (ref 3.5–5.2)
Sodium: 136 mmol/L (ref 134–144)
eGFR: 111 mL/min/{1.73_m2} (ref >59)

## 2021-11-05 DIAGNOSIS — F410 Panic disorder [episodic paroxysmal anxiety] without agoraphobia: Secondary | ICD-10-CM | POA: Diagnosis not present

## 2021-11-05 DIAGNOSIS — R69 Illness, unspecified: Secondary | ICD-10-CM | POA: Diagnosis not present

## 2021-11-07 ENCOUNTER — Other Ambulatory Visit: Payer: Self-pay | Admitting: Radiology

## 2021-11-14 ENCOUNTER — Encounter: Payer: Self-pay | Admitting: Internal Medicine

## 2021-12-09 DIAGNOSIS — Z6826 Body mass index (BMI) 26.0-26.9, adult: Secondary | ICD-10-CM | POA: Diagnosis not present

## 2021-12-09 DIAGNOSIS — Z01419 Encounter for gynecological examination (general) (routine) without abnormal findings: Secondary | ICD-10-CM | POA: Diagnosis not present

## 2021-12-09 DIAGNOSIS — Z79890 Hormone replacement therapy (postmenopausal): Secondary | ICD-10-CM | POA: Diagnosis not present

## 2021-12-09 DIAGNOSIS — Z124 Encounter for screening for malignant neoplasm of cervix: Secondary | ICD-10-CM | POA: Diagnosis not present

## 2021-12-09 DIAGNOSIS — N951 Menopausal and female climacteric states: Secondary | ICD-10-CM | POA: Diagnosis not present

## 2021-12-14 ENCOUNTER — Ambulatory Visit (INDEPENDENT_AMBULATORY_CARE_PROVIDER_SITE_OTHER): Payer: 59 | Admitting: Gastroenterology

## 2022-01-18 ENCOUNTER — Ambulatory Visit
Admission: RE | Admit: 2022-01-18 | Discharge: 2022-01-18 | Disposition: A | Discharge status: Home / Self Care | Payer: 59 | Source: Ambulatory Visit | Attending: Internal Medicine | Admitting: Internal Medicine

## 2022-01-18 DIAGNOSIS — Z1231 Encounter for screening mammogram for malignant neoplasm of breast: Secondary | ICD-10-CM | POA: Diagnosis not present

## 2022-01-19 ENCOUNTER — Other Ambulatory Visit: Payer: Self-pay | Admitting: Internal Medicine

## 2022-01-19 ENCOUNTER — Encounter: Payer: Self-pay | Admitting: Internal Medicine

## 2022-01-19 DIAGNOSIS — R112 Nausea with vomiting, unspecified: Secondary | ICD-10-CM

## 2022-01-19 MED ORDER — ONDANSETRON HCL 4 MG PO TABS: 4.0000 mg | ORAL_TABLET | Freq: Three times a day (TID) | ORAL | 0 refills | Status: DC | PRN

## 2022-01-25 ENCOUNTER — Encounter: Payer: Self-pay | Admitting: Internal Medicine

## 2022-01-25 ENCOUNTER — Other Ambulatory Visit: Payer: Self-pay | Admitting: Internal Medicine

## 2022-01-25 DIAGNOSIS — E039 Hypothyroidism, unspecified: Secondary | ICD-10-CM

## 2022-01-25 MED ORDER — LEVOTHYROXINE SODIUM 125 MCG PO TABS: 125.0000 ug | ORAL_TABLET | Freq: Every day | ORAL | 3 refills | Status: DC

## 2022-02-03 ENCOUNTER — Ambulatory Visit: Payer: 59 | Admitting: Internal Medicine

## 2022-02-03 ENCOUNTER — Encounter: Payer: Self-pay | Admitting: Internal Medicine

## 2022-02-03 VITALS — BP 133/86 | HR 87 | Ht 66.0 in | Wt 154.8 lb

## 2022-02-03 DIAGNOSIS — E1169 Type 2 diabetes mellitus with other specified complication: Secondary | ICD-10-CM

## 2022-02-03 DIAGNOSIS — E039 Hypothyroidism, unspecified: Secondary | ICD-10-CM | POA: Diagnosis not present

## 2022-02-03 DIAGNOSIS — I10 Essential (primary) hypertension: Secondary | ICD-10-CM

## 2022-02-03 DIAGNOSIS — N951 Menopausal and female climacteric states: Secondary | ICD-10-CM | POA: Diagnosis not present

## 2022-02-03 DIAGNOSIS — J4530 Mild persistent asthma, uncomplicated: Secondary | ICD-10-CM

## 2022-02-03 MED ORDER — VEOZAH 45 MG PO TABS: 1.0000 | ORAL_TABLET | Freq: Every day | ORAL | 0 refills | Status: DC

## 2022-02-03 MED ORDER — TIRZEPATIDE 2.5 MG/0.5ML ~~LOC~~ SOAJ: 2.5000 mg | SUBCUTANEOUS | 0 refills | Status: DC

## 2022-02-03 NOTE — Patient Instructions (Signed)
Please start taking Mounjaro as prescribed instead of Trulicity.  Please continue to take other medications as prescribed.  Please continue to follow low carb diet and perform moderate exercise/walking at least 150 mins/week.

## 2022-02-03 NOTE — Assessment & Plan Note (Signed)
Lab Results  Component Value Date   HGBA1C 6.6 (H) 11/03/2021   Associated with HTN and HLD On Metformin and Trulicity She has persistent nausea with Trulicity, switched to [facility] Advised to follow diabetic diet On ARB now F/u CMP and lipid panel later Diabetic eye exam: Advised to follow up with Ophthalmology for diabetic eye exam

## 2022-02-04 DIAGNOSIS — N951 Menopausal and female climacteric states: Secondary | ICD-10-CM | POA: Insufficient documentation

## 2022-02-04 NOTE — Assessment & Plan Note (Signed)
Lab Results  Component Value Date   TSH 0.275 (L) 11/03/2021   Currently on levothyroxine 125 mcg QD Was on NP thyroid 90 mg QD Chart review suggests normal TSH in the past as well

## 2022-02-04 NOTE — Assessment & Plan Note (Signed)
Currently on HRT Still has persistent hot flashes due to recent decrease in dose of estradiol HRT is not appropriate for her due to her active smoking state Started [place]ozah instead

## 2022-02-04 NOTE — Assessment & Plan Note (Signed)
Well-controlled with Advair and PRN Albuterol

## 2022-02-04 NOTE — Progress Notes (Signed)
Established Patient Office Visit  Subjective:  Patient ID: Gwendolyn [name], female    DOB: Sep 28, 1965  Age: 56 y.o. MRN: [id number]  CC:  Chief Complaint  Patient presents with   Follow-up    K[facility]neys    HPI Gwendolyn [name] is a 56 y.o. female with past medical history of HTN, asthma, type II DM, hypothyroidism and tobacco abuse who presents for f/u of her chronic medical conditions.  Type II DM: She currently takes metformin and Trulicity. She has been having persistent nausea with Trulicity. She also reports bloating and abdominal pain with it. Her HbA1c was stable at 6.6 in 09/23. She denies any polyuria or polyphagia currently.  Hypothyroidism: She has been taking levothyroxine 125 mcg now.  Denies any recent change in weight or appetite.  Postmenopausal hot flashes: She is currently getting HRT through OB/GYN clinic, but her dose of estradiol was recently decreased due to recent diagnosis of uterine fibroid.  Of note, she is active smoker.  After discussion, she agrees to switch to nonhormonal treatments for hot flashes.   Past Medical History:  Diagnosis Date   Diabetes mellitus without complication ([place])    History of TIAs    Palpitations    Polycystic ovarian disease    Tobacco abuse     Past Surgical History:  Procedure Laterality Date   BREAST BIOPSY Left    TUBAL LIGATION      Family History  Problem Relation Age of Onset   Hypertension Mother    Diabetes Father    Breast cancer Maternal Grandmother    Diabetes Brother     Social History   Socioeconomic History   Marital status: Married    Spouse name: Not on file   Number of children: Not on file   Years of education: Not on file   Highest education level: Not on file  Occupational History   Not on file  Tobacco Use   Smoking status: Every Day    Packs/day: 0.25    Years: 20.00    Total pack years: 5.00    Types: Cigarettes   Smokeless tobacco: Never  Vaping Use   Vaping Use: Former   Substance and Sexual Activity   Alcohol use: Yes    Alcohol/week: 14.0 standard drinks of alcohol    Types: 14 Glasses of wine per week    Comment: 2-3 times per week   Drug use: No   Sexual activity: Not Currently    Birth control/protection: Post-menopausal  Other Topics Concern   Not on file  Social History Narrative   Exercises rarely. Smokes tobacco. Works in a [employer]. Lives in [place]. Eats meat fruit and vegetables. Married for 32 years,lives with husband.   Social Determinants of Health   Financial Resource Strain: Not on file  Food Insecurity: Not on file  Transportation Needs: Not on file  Physical Activity: Not on file  Stress: Not on file  Social Connections: Not on file  Intimate Partner Violence: Not on file    Outpatient Medications Prior to Visit  Medication Sig Dispense Refill   ondansetron (ZOFRAN) 4 MG tablet Take 1 tablet (4 mg total) by mouth every 8 (eight) hours as needed for nausea or vomiting. 20 tablet 0   polyethylene glycol-electrolytes (NULYTELY) 420 g solution Take 4,000 mLs by mouth once.     traZODone (DESYREL) 100 MG tablet Take 50-100 mg by mouth at bedtime.     ADVAIR HFA 45-21 MCG/ACT  inhaler INHALE 2 PUFFS INTO THE LUNGS TWICE A DAY 24 each 5   albuterol (PROAIR HFA) 108 (90 Base) MCG/ACT inhaler Inhale 2 puffs into the lungs every 6 (six) hours as needed. 18 g 6   Albuterol Sulfate, sensor, (PROAIR DIGIHALER) 108 (90 Base) MCG/ACT AEPB Inhale 2 puffs every 4 hours by inhalation route.     diazepam (VALIUM) 5 MG tablet TAKE 1 TABLET BY MOUTH DAILY AS NEEDED FOR SEVERE ANXIETY/ PANIC     dicyclomine (BENTYL) 10 MG capsule Take 1 capsule (10 mg total) by mouth 3 (three) times daily as needed for spasms. 20 capsule 0   EPINEPHrine (EPIPEN 2-PAK) 0.3 mg/0.3 mL IJ SOAJ injection Inject 0.3 mg into the muscle as needed for anaphylaxis. 1 each 1   escitalopram (LEXAPRO) 20 MG tablet Take  20 mg by mouth daily.     estradiol (ESTRACE) 1 MG tablet Take 1 tablet (1 mg total) by mouth daily. (Patient taking differently: Take 1 mg by mouth daily. 0.5 daily.) 90 tablet 1   levothyroxine (SYNTHROID) 125 MCG tablet Take 1 tablet (125 mcg total) by mouth daily. 30 tablet 3   metFORMIN (GLUCOPHAGE) 500 MG tablet TAKE 1 TABLET(500 MG) BY MOUTH TWICE DAILY WITH A MEAL 180 tablet 0   nitroGLYCERIN (NITROSTAT) 0.4 MG SL tablet Place 1 tablet (0.4 mg total) under the tongue every 5 (five) minutes as needed. 25 tablet 3   Plecanatide (TRULANCE) 3 MG TABS Take 1 each by mouth daily. 90 tablet 1   progesterone (PROMETRIUM) 200 MG capsule Take 2 capsules (400 mg total) by mouth daily. 180 capsule 1   telmisartan (MICARDIS) 20 MG tablet Take 1 tablet (20 mg total) by mouth daily. 90 tablet 1   amoxicillin-clavulanate (AUGMENTIN) 875-125 MG tablet Take 1 tablet by mouth 2 (two) times daily. 14 tablet 0   busPIRone (BUSPAR) 15 MG tablet Take 1-2 tablets by mouth 2 (two) times daily.     Dulaglutide (TRULICITY) 1.5 FX/5.8IT SOPN Inject 1.5 mg into the skin once a week. 6 mL 1   thyroid (NP THYROID) 90 MG tablet Take 1 tablet by mouth daily.     No facility-administered medications prior to visit.    Allergies  Allergen Reactions   Lidocaine Anaphylaxis, Other (See Comments) and Nausea And Vomiting   Other Hives and Rash    SEA MOSS    ROS Review of Systems  Constitutional:  Positive for fatigue. Negative for chills and fever.  HENT:  Negative for congestion, postnasal drip, sinus pressure, sinus pain and sore throat.   Eyes:  Negative for pain and discharge.  Respiratory:  Negative for cough and shortness of breath.   Cardiovascular:  Negative for chest pain and palpitations.  Gastrointestinal:  Negative for abdominal pain, diarrhea, nausea and vomiting.  Endocrine: Positive for heat intolerance. Negative for polydipsia and polyuria.  Genitourinary:  Negative for dysuria and hematuria.   Musculoskeletal:  Negative for neck pain and neck stiffness.  Skin:  Negative for rash.  Neurological:  Negative for dizziness and weakness.  Psychiatric/Behavioral:  Negative for agitation and behavioral problems.       Objective:    Physical Exam Vitals reviewed.  Constitutional:      General: She is not in acute distress.    Appearance: She is not diaphoretic.  HENT:     Head: Normocephalic and atraumatic.     Nose: No congestion.     Mouth/Throat:     Mouth: Mucous membranes are moist.  Eyes:     General: No scleral icterus.    Extraocular Movements: Extraocular movements intact.  Cardiovascular:     Rate and Rhythm: Normal rate and regular rhythm.     Pulses: Normal pulses.     Heart sounds: Normal heart sounds. No murmur heard. Pulmonary:     Breath sounds: Normal breath sounds. No wheezing or rales.  Musculoskeletal:     Cervical back: Neck supple. No tenderness.     Right lower leg: No edema.     Left lower leg: No edema.  Skin:    General: Skin is warm.     Findings: No rash.  Neurological:     General: No focal deficit present.     Mental Status: She is alert and oriented to person, place, and time.     Sensory: No sensory deficit.     Motor: No weakness.  Psychiatric:        Mood and Affect: Mood normal.        Behavior: Behavior normal.     BP 133/86 (BP Location: Left Arm, Patient Position: Sitting, Cuff Size: Normal)   Pulse 87   Ht _0  (1.676 m)   Wt 154 lb 12.8 oz (70.2 kg)   LMP 03/30/2011   SpO2 94%   BMI 24.99 kg/m  Wt Readings from Last 3 Encounters:  02/03/22 154 lb 12.8 oz (70.2 kg)  11/03/21 157 lb (71.2 kg)  08/10/21 163 lb 14.4 oz (74.3 kg)    Lab Results  Component Value Date   TSH 0.275 (L) 11/03/2021   Lab Results  Component Value Date   WBC 6.7 09/03/2019   HGB 14.2 09/03/2019   HCT 41.5 09/03/2019   MCV 95.4 09/03/2019   PLT 235 09/03/2019   Lab Results  Component Value Date   NA 136 11/03/2021   K 4.5  11/03/2021   CO2 23 11/03/2021   GLUCOSE 100 (H) 11/03/2021   BUN 9 11/03/2021   CREATININE 0.50 (L) 11/03/2021   BILITOT 0.2 06/09/2020   AST 13 06/09/2020   ALT 22 06/09/2020   PROT 6.5 06/09/2020   CALCIUM 9.5 11/03/2021   EGFR 111 11/03/2021   GFR 105.90 04/22/2010   Lab Results  Component Value Date   CHOL 165 09/03/2019   Lab Results  Component Value Date   HDL 67 09/03/2019   Lab Results  Component Value Date   LDLCALC 79 09/03/2019   Lab Results  Component Value Date   TRIG 107 09/03/2019   Lab Results  Component Value Date   CHOLHDL 2.5 09/03/2019   Lab Results  Component Value Date   HGBA1C 6.6 (H) 11/03/2021      Assessment & Plan:   Problem List Items Addressed This Visit       Cardiovascular and Mediastinum   Essential hypertension    BP Readings from Last 1 Encounters:  02/03/22 133/86  Well-controlled with Telmisartan 20 mg QD Counseled for compliance with the medications Advised DASH diet and moderate exercise/walking, at least 150 mins/week      Menopausal hot flushes    Currently on HRT Still has persistent hot flashes due to recent decrease in dose of estradiol HRT is not appropriate for her due to her active smoking state Started Veozah instead      Relevant Medications   Fezolinetant (VEOZAH) 45 MG TABS     Respiratory   Asthma    Well-controlled with Advair and PRN Albuterol      Relevant  Medications   Albuterol Sulfate, sensor, (PROAIR DIGIHALER) 108 (90 Base) MCG/ACT AEPB   Other Relevant Orders   CBC with Differential/Platelet     Endocrine   Hypothyroidism    Lab Results  Component Value Date   TSH 0.275 (L) 11/03/2021  Currently on levothyroxine 125 mcg QD Was on NP thyroid 90 mg QD Chart review suggests normal TSH in the past as well      Relevant Orders   TSH + free T4   Type 2 diabetes mellitus ([place]) - Primary    Lab Results  Component Value Date   HGBA1C 6.6 (H) 11/03/2021  Associated with HTN and  HLD On Metformin and Trulicity She has persistent nausea with Trulicity, switched to [facility] Advised to follow diabetic diet On ARB now F/u CMP and lipid panel later Diabetic eye exam: Advised to follow up with Ophthalmology for diabetic eye exam      Relevant Medications   tirzepatide (MOUNJARO) 2.5 MG/0.5ML Pen   Other Relevant Orders   Urine Microalbumin w/creat. ratio   CMP14+EGFR   Hemoglobin A1c    Meds ordered this encounter  Medications   tirzepatide (MOUNJARO) 2.5 MG/0.5ML Pen    Sig: Inject 2.5 mg into the skin once a week.    Dispense:  2 mL    Refill:  0   Fezolinetant (VEOZAH) 45 MG TABS    Sig: Take 1 tablet by mouth daily.    Dispense:  30 tablet    Refill:  0    Follow-up: Return if symptoms worsen or fail to improve.    Lindell Spar, MD

## 2022-02-04 NOTE — Assessment & Plan Note (Signed)
BP Readings from Last 1 Encounters:  02/03/22 133/86   Well-controlled with Telmisartan 20 mg QD Counseled for compliance with the medications Advised DASH diet and moderate exercise/walking, at least 150 mins/week

## 2022-02-05 LAB — MICROALBUMIN / CREATININE URINE RATIO
Creatinine, Urine: 84.9 mg/dL
Microalb/Creat Ratio: 26 mg/g creat (ref 0–29)
Microalbumin, Urine: 22.1 ug/mL

## 2022-02-10 ENCOUNTER — Other Ambulatory Visit: Payer: Self-pay

## 2022-02-22 ENCOUNTER — Encounter: Payer: Self-pay | Admitting: Internal Medicine

## 2022-02-23 DIAGNOSIS — E1169 Type 2 diabetes mellitus with other specified complication: Secondary | ICD-10-CM | POA: Diagnosis not present

## 2022-02-23 DIAGNOSIS — J4530 Mild persistent asthma, uncomplicated: Secondary | ICD-10-CM | POA: Diagnosis not present

## 2022-02-23 DIAGNOSIS — E039 Hypothyroidism, unspecified: Secondary | ICD-10-CM | POA: Diagnosis not present

## 2022-02-24 ENCOUNTER — Other Ambulatory Visit: Payer: Self-pay | Admitting: Internal Medicine

## 2022-02-24 DIAGNOSIS — E039 Hypothyroidism, unspecified: Secondary | ICD-10-CM

## 2022-02-24 LAB — CMP14+EGFR
ALT: 26 IU/L (ref 0–32)
AST: 19 IU/L (ref 0–40)
Albumin/Globulin Ratio: 2 (ref 1.2–2.2)
Albumin: 4.7 g/dL (ref 3.8–4.9)
Alkaline Phosphatase: 60 IU/L (ref 44–121)
BUN/Creatinine Ratio: 15 (ref 9–23)
BUN: 10 mg/dL (ref 6–24)
Bilirubin Total: 0.3 mg/dL (ref 0.0–1.2)
CO2: 21 mmol/L (ref 20–29)
Calcium: 9.9 mg/dL (ref 8.7–10.2)
Chloride: 99 mmol/L (ref 96–106)
Creatinine, Ser: 0.68 mg/dL (ref 0.57–1.00)
Globulin, Total: 2.4 g/dL (ref 1.5–4.5)
Glucose: 125 mg/dL — ABNORMAL HIGH (ref 70–99)
Potassium: 5 mmol/L (ref 3.5–5.2)
Sodium: 136 mmol/L (ref 134–144)
Total Protein: 7.1 g/dL (ref 6.0–8.5)
eGFR: 103 mL/min/{1.73_m2} (ref >59)

## 2022-02-24 LAB — CBC WITH DIFFERENTIAL/PLATELET
Basophils Absolute: 0 10*3/uL (ref 0.0–0.2)
Basos: 1 %
EOS (ABSOLUTE): 0.1 10*3/uL (ref 0.0–0.4)
Eos: 1 %
Hematocrit: 45.5 % (ref 34.0–46.6)
Hemoglobin: 15.6 g/dL (ref 11.1–15.9)
Immature Grans (Abs): 0 10*3/uL (ref 0.0–0.1)
Immature Granulocytes: 0 %
Lymphocytes Absolute: 2.7 10*3/uL (ref 0.7–3.1)
Lymphs: 41 %
MCH: 33.1 pg — ABNORMAL HIGH (ref 26.6–33.0)
MCHC: 34.3 g/dL (ref 31.5–35.7)
MCV: 97 fL (ref 79–97)
Monocytes Absolute: 0.5 10*3/uL (ref 0.1–0.9)
Monocytes: 8 %
Neutrophils Absolute: 3.2 10*3/uL (ref 1.4–7.0)
Neutrophils: 49 %
Platelets: 263 10*3/uL (ref 150–450)
RBC: 4.71 x10E6/uL (ref 3.77–5.28)
RDW: 12.9 % (ref 11.7–15.4)
WBC: 6.5 10*3/uL (ref 3.4–10.8)

## 2022-02-24 LAB — TSH+FREE T4
Free T4: 1.87 ng/dL — ABNORMAL HIGH (ref 0.82–1.77)
TSH: 0.071 u[IU]/mL — ABNORMAL LOW (ref 0.450–4.500)

## 2022-02-24 LAB — HEMOGLOBIN A1C
Est. average glucose Bld gHb Est-mCnc: 128 mg/dL
Hgb A1c MFr Bld: 6.1 % — ABNORMAL HIGH (ref 4.8–5.6)

## 2022-02-24 MED ORDER — LEVOTHYROXINE SODIUM 100 MCG PO TABS: 100.0000 ug | ORAL_TABLET | Freq: Every day | ORAL | 3 refills | Status: DC

## 2022-03-01 ENCOUNTER — Other Ambulatory Visit: Payer: Self-pay | Admitting: Internal Medicine

## 2022-03-01 DIAGNOSIS — E1169 Type 2 diabetes mellitus with other specified complication: Secondary | ICD-10-CM

## 2022-03-04 ENCOUNTER — Ambulatory Visit: Payer: 59 | Admitting: Internal Medicine

## 2022-03-04 ENCOUNTER — Encounter: Payer: Self-pay | Admitting: Internal Medicine

## 2022-03-04 VITALS — BP 128/84 | HR 90 | Ht 66.0 in | Wt 154.6 lb

## 2022-03-04 DIAGNOSIS — E039 Hypothyroidism, unspecified: Secondary | ICD-10-CM | POA: Diagnosis not present

## 2022-03-04 DIAGNOSIS — I10 Essential (primary) hypertension: Secondary | ICD-10-CM | POA: Diagnosis not present

## 2022-03-04 DIAGNOSIS — E1169 Type 2 diabetes mellitus with other specified complication: Secondary | ICD-10-CM

## 2022-03-04 DIAGNOSIS — N951 Menopausal and female climacteric states: Secondary | ICD-10-CM | POA: Diagnosis not present

## 2022-03-04 DIAGNOSIS — E782 Mixed hyperlipidemia: Secondary | ICD-10-CM | POA: Diagnosis not present

## 2022-03-04 MED ORDER — TIRZEPATIDE 5 MG/0.5ML ~~LOC~~ SOAJ
5.0000 mg | SUBCUTANEOUS | 1 refills | Status: DC
  Filled 2022-05-06: qty 2, 28d supply, fill #0

## 2022-03-04 MED ORDER — METFORMIN HCL ER 500 MG PO TB24: 500.0000 mg | ORAL_TABLET | Freq: Every day | ORAL | 1 refills | Status: DC

## 2022-03-04 NOTE — Assessment & Plan Note (Signed)
Lab Results  Component Value Date   TSH 0.071 (L) 02/23/2022   Decreased dose of levothyroxine to 100 mcg daily Symptoms slowly improving Was on NP thyroid 90 mg QD Chart review suggests normal TSH in the past as well

## 2022-03-04 NOTE — Patient Instructions (Addendum)
Please start taking Mounjaro 5 mg once weekly and Metformin 500 mg once daily instead of twice daily.  Please stop taking Estradiol and Progesterone.  Please continue taking other medications as prescribed.  Please continue to follow low carb diet and perform moderate exercise/walking at least 150 mins/week.  Please get fasting blood tests done before your next visit.

## 2022-03-04 NOTE — Assessment & Plan Note (Signed)
BP Readings from Last 1 Encounters:  03/04/22 128/84   Well-controlled with Telmisartan 20 mg QD Counseled for compliance with the medications Advised DASH diet and moderate exercise/walking, at least 150 mins/week

## 2022-03-04 NOTE — Assessment & Plan Note (Addendum)
Lab Results  Component Value Date   HGBA1C 6.1 (H) 02/23/2022   Associated with HTN and HLD On Metformin and Trulicity She had persistent nausea with Trulicity, switched to [facility], tolerating it better, increased dose to 5 mg qw Decreased metformin to 500 mg daily Advised to follow diabetic diet On ARB now F/u CMP and lipid panel later Diabetic eye exam: Advised to follow up with Ophthalmology for diabetic eye exam

## 2022-03-04 NOTE — Assessment & Plan Note (Signed)
Was on HRT Still has persistent hot flashes due to recent decrease in dose of estradiol HRT is not appropriate for her due to her active smoking state Started Veozah instead, but was not effective

## 2022-03-04 NOTE — Assessment & Plan Note (Signed)
Discussed about statin Check lipid profile

## 2022-03-04 NOTE — Progress Notes (Addendum)
Established Patient Office Visit  Subjective:  Patient ID: Gwendolyn [name], female    DOB: Nov 03, 1965  Age: 57 y.o. MRN: [id number]  CC:  Chief Complaint  Patient presents with   Diabetes    Patient following up on her diabetes and hypothroidism    HPI Gwendolyn [name] is a 57 y.o. female with past medical history of HTN, asthma, type II DM, hypothyroidism and tobacco abuse who presents for f/u of her chronic medical conditions.  Type II DM: She currently takes metformin and has started taking Mounjaro.  She denies any nausea or vomiting with Mounjaro now. Her HbA1c has improved to 6.1 now. She denies any polyuria or polyphagia currently.  Hypothyroidism: She was having jitteriness, tremors and palpitations with levothyroxine 125 mcg now.  Her thyroid function testing shows oversupplementation.  She has started taking levothyroxine 100 mcg daily now, and has started noticing improvement in her tremors.  Denies any recent change in weight or appetite.  Postmenopausal hot flashes: She is currently getting HRT through OB/GYN clinic, but her dose of estradiol was recently decreased due to recent diagnosis of uterine fibroid.  Of note, she is active smoker.  She has stopped taking estrogen and asks if she can stop progesterone.  She was given trial of Veozah, but has stopped taking it as it was not effective.  Past Medical History:  Diagnosis Date   Diabetes mellitus without complication ([facility])    History of TIAs    Palpitations    Polycystic ovarian disease    Tobacco abuse     Past Surgical History:  Procedure Laterality Date   BREAST BIOPSY Left    TUBAL LIGATION      Family History  Problem Relation Age of Onset   Hypertension Mother    Diabetes Father    Breast cancer Maternal Grandmother    Diabetes Brother     Social History   Socioeconomic History   Marital status: Married    Spouse name: Not on file   Number of children: Not on file   Years of education: Not on  file   Highest education level: Not on file  Occupational History   Not on file  Tobacco Use   Smoking status: Every Day    Packs/day: 0.25    Years: 20.00    Total pack years: 5.00    Types: Cigarettes   Smokeless tobacco: Never  Vaping Use   Vaping Use: Former  Substance and Sexual Activity   Alcohol use: Yes    Alcohol/week: 14.0 standard drinks of alcohol    Types: 14 Glasses of wine per week    Comment: 2-3 times per week   Drug use: No   Sexual activity: Not Currently    Birth control/protection: Post-menopausal  Other Topics Concern   Not on file  Social History Narrative   Exercises rarely. Smokes tobacco. Works in a Theme park manager. Lives in [place]. Eats meat fruit and vegetables. Married for 18 years,lives with husband.   Social Determinants of Health   Financial Resource Strain: Not on file  Food Insecurity: Not on file  Transportation Needs: Not on file  Physical Activity: Not on file  Stress: Not on file  Social Connections: Not on file  Intimate Partner Violence: Not on file    Outpatient Medications Prior to Visit  Medication Sig Dispense Refill   ondansetron (ZOFRAN) 4 MG tablet Take 1 tablet (4 mg total) by mouth every 8 (eight) hours  as needed for nausea or vomiting. 20 tablet 0   ADVAIR HFA 45-21 MCG/ACT inhaler INHALE 2 PUFFS INTO THE LUNGS TWICE A DAY 24 each 5   albuterol (PROAIR HFA) 108 (90 Base) MCG/ACT inhaler Inhale 2 puffs into the lungs every 6 (six) hours as needed. 18 g 6   Albuterol Sulfate, sensor, (PROAIR DIGIHALER) 108 (90 Base) MCG/ACT AEPB Inhale 2 puffs every 4 hours by inhalation route.     diazepam (VALIUM) 5 MG tablet TAKE 1 TABLET BY MOUTH DAILY AS NEEDED FOR SEVERE ANXIETY/ PANIC     EPINEPHrine (EPIPEN 2-PAK) 0.3 mg/0.3 mL IJ SOAJ injection Inject 0.3 mg into the muscle as needed for anaphylaxis. 1 each 1   escitalopram (LEXAPRO) 20 MG tablet Take 20 mg by mouth daily.      levothyroxine (SYNTHROID) 100 MCG tablet Take 1 tablet (100 mcg total) by mouth daily. 30 tablet 3   nitroGLYCERIN (NITROSTAT) 0.4 MG SL tablet Place 1 tablet (0.4 mg total) under the tongue every 5 (five) minutes as needed. 25 tablet 3   Plecanatide (TRULANCE) 3 MG TABS Take 1 each by mouth daily. 90 tablet 1   polyethylene glycol-electrolytes (NULYTELY) 420 g solution Take 4,000 mLs by mouth once.     telmisartan (MICARDIS) 20 MG tablet Take 1 tablet (20 mg total) by mouth daily. 90 tablet 1   dicyclomine (BENTYL) 10 MG capsule Take 1 capsule (10 mg total) by mouth 3 (three) times daily as needed for spasms. 20 capsule 0   estradiol (ESTRACE) 1 MG tablet Take 1 tablet (1 mg total) by mouth daily. (Patient taking differently: Take 1 mg by mouth daily. 0.5 daily.) 90 tablet 1   Fezolinetant (VEOZAH) 45 MG TABS Take 1 tablet by mouth daily. 30 tablet 0   metFORMIN (GLUCOPHAGE) 500 MG tablet TAKE 1 TABLET(500 MG) BY MOUTH TWICE DAILY WITH A MEAL 180 tablet 0   progesterone (PROMETRIUM) 200 MG capsule Take 2 capsules (400 mg total) by mouth daily. 180 capsule 1   tirzepatide (MOUNJARO) 2.5 MG/0.5ML Pen INJECT 2.5 MG SUBCUTANEOUSLY WEEKLY 2 mL 1   TRULICITY 0.75 MG/0.5ML SOPN Inject into the skin.     TRULICITY 1.5 MG/0.5ML SOPN Inject into the skin.     No facility-administered medications prior to visit.    Allergies  Allergen Reactions   Lidocaine Anaphylaxis, Other (See Comments) and Nausea And Vomiting   Other Hives and Rash    SEA MOSS    ROS Review of Systems  Constitutional:  Positive for fatigue. Negative for chills and fever.  HENT:  Negative for congestion, postnasal drip, sinus pressure, sinus pain and sore throat.   Eyes:  Negative for pain and discharge.  Respiratory:  Negative for cough and shortness of breath.   Cardiovascular:  Positive for palpitations. Negative for chest pain.  Gastrointestinal:  Negative for abdominal pain, diarrhea, nausea and vomiting.  Endocrine:  Positive for heat intolerance. Negative for polydipsia and polyuria.  Genitourinary:  Negative for dysuria and hematuria.  Musculoskeletal:  Negative for neck pain and neck stiffness.  Skin:  Negative for rash.  Neurological:  Positive for tremors. Negative for dizziness and weakness.  Psychiatric/Behavioral:  Negative for agitation and behavioral problems. The patient is nervous/anxious.       Objective:    Physical Exam Vitals reviewed.  Constitutional:      General: She is not in acute distress.    Appearance: She is not diaphoretic.  HENT:     Head: Normocephalic and  atraumatic.     Nose: No congestion.     Mouth/Throat:     Mouth: Mucous membranes are moist.  Eyes:     General: No scleral icterus.    Extraocular Movements: Extraocular movements intact.  Cardiovascular:     Rate and Rhythm: Normal rate and regular rhythm.     Pulses: Normal pulses.     Heart sounds: Normal heart sounds. No murmur heard. Pulmonary:     Breath sounds: Normal breath sounds. No wheezing or rales.  Musculoskeletal:     Cervical back: Neck supple. No tenderness.     Right lower leg: No edema.     Left lower leg: No edema.  Skin:    General: Skin is warm.     Findings: No rash.  Neurological:     General: No focal deficit present.     Mental Status: She is alert and oriented to person, place, and time.     Sensory: No sensory deficit.     Motor: Tremor (L > R hand) present. No weakness.  Psychiatric:        Mood and Affect: Mood normal.        Behavior: Behavior normal.     BP 128/84 (BP Location: Left Arm, Patient Position: Sitting, Cuff Size: Normal)   Pulse 90   Ht  (1.676 m)   Wt 154 lb 9.6 oz (70.1 kg)   LMP 03/30/2011   SpO2 92%   BMI 24.95 kg/m  Wt Readings from Last 3 Encounters:  03/04/22 154 lb 9.6 oz (70.1 kg)  02/03/22 154 lb 12.8 oz (70.2 kg)  11/03/21 157 lb (71.2 kg)    Lab Results  Component Value Date   TSH 0.071 (L) 02/23/2022   Lab Results   Component Value Date   WBC 6.5 02/23/2022   HGB 15.6 02/23/2022   HCT 45.5 02/23/2022   MCV 97 02/23/2022   PLT 263 02/23/2022   Lab Results  Component Value Date   NA 136 02/23/2022   K 5.0 02/23/2022   CO2 21 02/23/2022   GLUCOSE 125 (H) 02/23/2022   BUN 10 02/23/2022   CREATININE 0.68 02/23/2022   BILITOT 0.3 02/23/2022   ALKPHOS 60 02/23/2022   AST 19 02/23/2022   ALT 26 02/23/2022   PROT 7.1 02/23/2022   ALBUMIN 4.7 02/23/2022   CALCIUM 9.9 02/23/2022   EGFR 103 02/23/2022   GFR 105.90 04/22/2010   Lab Results  Component Value Date   CHOL 165 09/03/2019   Lab Results  Component Value Date   HDL 67 09/03/2019   Lab Results  Component Value Date   LDLCALC 79 09/03/2019   Lab Results  Component Value Date   TRIG 107 09/03/2019   Lab Results  Component Value Date   CHOLHDL 2.5 09/03/2019   Lab Results  Component Value Date   HGBA1C 6.1 (H) 02/23/2022      Assessment & Plan:   Problem List Items Addressed This Visit       Cardiovascular and Mediastinum   Essential hypertension    BP Readings from Last 1 Encounters:  03/04/22 128/84  Well-controlled with Telmisartan 20 mg QD Counseled for compliance with the medications Advised DASH diet and moderate exercise/walking, at least 150 mins/week      Menopausal hot flushes    Was on HRT Still has persistent hot flashes due to recent decrease in dose of estradiol HRT is not appropriate for her due to her active smoking state Started [employer]   instead, but was not effective        Endocrine   Hypothyroidism    Lab Results  Component Value Date   TSH 0.071 (L) 02/23/2022  Decreased dose of levothyroxine to 100 mcg daily Symptoms slowly improving Was on NP thyroid 90 mg QD Chart review suggests normal TSH in the past as well      Relevant Orders   TSH + free T4   Type 2 diabetes mellitus ([facility]) - Primary    Lab Results  Component Value Date   HGBA1C 6.1 (H) 02/23/2022  Associated with HTN  and HLD On Metformin and Trulicity She had persistent nausea with Trulicity, switched to [facility], tolerating it better, increased dose to 5 mg qw Decreased metformin to 500 mg daily Advised to follow diabetic diet On ARB now F/u CMP and lipid panel later Diabetic eye exam: Advised to follow up with Ophthalmology for diabetic eye exam      Relevant Medications   tirzepatide (MOUNJARO) 5 MG/0.5ML Pen   metFORMIN (GLUCOPHAGE-XR) 500 MG 24 hr tablet     Other   Mixed hyperlipidemia    Discussed about statin Check lipid profile      Relevant Orders   Lipid Profile   Meds ordered this encounter  Medications   tirzepatide (MOUNJARO) 5 MG/0.5ML Pen    Sig: Inject 5 mg into the skin once a week.    Dispense:  6 mL    Refill:  1   metFORMIN (GLUCOPHAGE-XR) 500 MG 24 hr tablet    Sig: Take 1 tablet (500 mg total) by mouth daily with breakfast.    Dispense:  90 tablet    Refill:  1    Follow-up: Return in about 3 months (around 06/03/2022) for DM and hypothyroidism.    Anabel Halon, MD

## 2022-03-08 ENCOUNTER — Other Ambulatory Visit: Payer: Self-pay | Admitting: Internal Medicine

## 2022-03-08 DIAGNOSIS — I10 Essential (primary) hypertension: Secondary | ICD-10-CM

## 2022-03-11 ENCOUNTER — Other Ambulatory Visit: Payer: Self-pay | Admitting: Internal Medicine

## 2022-03-11 DIAGNOSIS — N951 Menopausal and female climacteric states: Secondary | ICD-10-CM

## 2022-04-05 ENCOUNTER — Encounter: Payer: Self-pay | Admitting: Internal Medicine

## 2022-04-06 ENCOUNTER — Encounter: Payer: Self-pay | Admitting: Internal Medicine

## 2022-04-06 ENCOUNTER — Ambulatory Visit (HOSPITAL_COMMUNITY)
Admission: RE | Admit: 2022-04-06 | Discharge: 2022-04-06 | Disposition: A | Discharge status: Home / Self Care | Payer: 59 | Source: Ambulatory Visit | Attending: Internal Medicine | Admitting: Internal Medicine

## 2022-04-06 ENCOUNTER — Ambulatory Visit: Payer: 59 | Admitting: Internal Medicine

## 2022-04-06 ENCOUNTER — Other Ambulatory Visit: Payer: Self-pay | Admitting: Internal Medicine

## 2022-04-06 VITALS — BP 136/82 | HR 114 | Ht 66.0 in | Wt 152.2 lb

## 2022-04-06 DIAGNOSIS — E039 Hypothyroidism, unspecified: Secondary | ICD-10-CM | POA: Diagnosis not present

## 2022-04-06 DIAGNOSIS — F331 Major depressive disorder, recurrent, moderate: Secondary | ICD-10-CM

## 2022-04-06 DIAGNOSIS — R69 Illness, unspecified: Secondary | ICD-10-CM | POA: Diagnosis not present

## 2022-04-06 DIAGNOSIS — M545 Low back pain, unspecified: Secondary | ICD-10-CM | POA: Diagnosis not present

## 2022-04-06 DIAGNOSIS — F339 Major depressive disorder, recurrent, unspecified: Secondary | ICD-10-CM | POA: Insufficient documentation

## 2022-04-06 DIAGNOSIS — M5136 Other intervertebral disc degeneration, lumbar region: Secondary | ICD-10-CM | POA: Diagnosis not present

## 2022-04-06 DIAGNOSIS — M51369 Other intervertebral disc degeneration, lumbar region without mention of lumbar back pain or lower extremity pain: Secondary | ICD-10-CM

## 2022-04-06 DIAGNOSIS — M898X6 Other specified disorders of bone, lower leg: Secondary | ICD-10-CM | POA: Diagnosis not present

## 2022-04-06 MED ORDER — KETOROLAC TROMETHAMINE 60 MG/2ML IM SOLN
60.0000 mg | Freq: Once | INTRAMUSCULAR | Status: AC
  Administered 2022-04-06: 60 mg via INTRAMUSCULAR

## 2022-04-06 MED ORDER — PREDNISONE 20 MG PO TABS: 20.0000 mg | ORAL_TABLET | Freq: Every day | ORAL | 0 refills | Status: DC

## 2022-04-06 MED ORDER — METHYLPREDNISOLONE ACETATE 80 MG/ML IJ SUSP
80.0000 mg | Freq: Once | INTRAMUSCULAR | Status: AC
  Administered 2022-04-06: 80 mg via INTRAMUSCULAR

## 2022-04-06 MED ORDER — LEVOTHYROXINE SODIUM 75 MCG PO TABS: 75.0000 ug | ORAL_TABLET | Freq: Every day | ORAL | 3 refills | Status: DC

## 2022-04-06 NOTE — Patient Instructions (Signed)
Please start taking Prednisone as prescribed.  Please start taking Levothyroxine 75 mcg once daily instead of 100 mcg.  Please avoid heavy lifting and frequent bending.

## 2022-04-06 NOTE — Assessment & Plan Note (Signed)
Has acute on chronic low back pain Has lumbar radiculopathy Check x-ray of lumbar spine Toradol and Depo-Medrol IM today Prednisone 20 mg X 5 days Advised to avoid NSAIDs while taking prednisone Avoid heavy lifting and frequent bending Simple back exercises advised Referred to PT

## 2022-04-06 NOTE — Progress Notes (Signed)
Established Patient Office Visit  Subjective:  Patient ID: Gwendolyn [name], female    DOB: [birth year]  Age: 57 y.o. MRN: [id number]  CC:  Chief Complaint  Patient presents with   Back Pain    Patient is having back pain that radiates down both of her legs    HPI Gwendolyn [name] is a 57 y.o. female with past medical history of HTN, asthma, type II DM, hypothyroidism and tobacco abuse who presents for f/u of her chronic medical conditions and back pain.  She complains of acute on chronic low back pain, bilateral, worse for the last 1 week, since 03/30/22, sharp, radiating to bilateral LE and has intermittent numbness of the bilateral thigh area.  She denies any recent injury.  She reports history of DDD of lumbar spine.  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  Hypothyroidism: She is still having jitteriness, tremors and palpitations with levothyroxine 100 mcg now.  Her thyroid function testing shows oversupplementation.  She has been taking levothyroxine 100 mcg daily now, and although has started noticing improvement in her tremors, but still bothers her. Denies any recent change in weight or appetite.   Past Medical History:  Diagnosis Date   Diabetes mellitus without complication ([place])    History of TIAs    Palpitations    Polycystic ovarian disease    Tobacco abuse     Past Surgical History:  Procedure Laterality Date   BREAST BIOPSY Left    TUBAL LIGATION      Family History  Problem Relation Age of Onset   Hypertension Mother    Diabetes Father    Breast cancer Maternal Grandmother    Diabetes Brother     Social History   Socioeconomic History   Marital status: Married    Spouse name: Not on file   Number of children: Not on file   Years of education: Not on file   Highest education level: Not on file  Occupational History   Not on file  Tobacco Use   Smoking status: Every Day    Packs/day: 0.25     Years: 20.00    Total pack years: 5.00    Types: Cigarettes   Smokeless tobacco: Never  Vaping Use   Vaping Use: Former  Substance and Sexual Activity   Alcohol use: Yes    Alcohol/week: 14.0 standard drinks of alcohol    Types: 14 Glasses of wine per week    Comment: 2-3 times per week   Drug use: No   Sexual activity: Not Currently    Birth control/protection: Post-menopausal  Other Topics Concern   Not on file  Social History Narrative   Exercises rarely. Smokes tobacco. Works in a [employer]. Lives in [place]. Eats meat fruit and vegetables. Married for 9 years,lives with husband.   Social Determinants of Health   Financial Resource Strain: Not on file  Food Insecurity: Not on file  Transportation Needs: Not on file  Physical Activity: Not on file  Stress: Not on file  Social Connections: Not on file  Intimate Partner Violence: Not on file    Outpatient Medications Prior to Visit  Medication Sig Dispense Refill   ondansetron (ZOFRAN) 4 MG tablet Take 1 tablet (4 mg total) by mouth every 8 (eight) hours as needed for nausea or vomiting. 20 tablet 0   ADVAIR HFA 45-21 MCG/ACT inhaler INHALE 2 PUFFS INTO THE  LUNGS TWICE A DAY 24 each 5   albuterol (PROAIR HFA) 108 (90 Base) MCG/ACT inhaler Inhale 2 puffs into the lungs every 6 (six) hours as needed. 18 g 6   Albuterol Sulfate, sensor, (PROAIR DIGIHALER) 108 (90 Base) MCG/ACT AEPB Inhale 2 puffs every 4 hours by inhalation route.     diazepam (VALIUM) 5 MG tablet TAKE 1 TABLET BY MOUTH DAILY AS NEEDED FOR SEVERE ANXIETY/ PANIC     EPINEPHrine (EPIPEN 2-PAK) 0.3 mg/0.3 mL IJ SOAJ injection Inject 0.3 mg into the muscle as needed for anaphylaxis. 1 each 1   escitalopram (LEXAPRO) 20 MG tablet Take 20 mg by mouth daily.     metFORMIN (GLUCOPHAGE-XR) 500 MG 24 hr tablet Take 1 tablet (500 mg total) by mouth daily with breakfast. 90 tablet 1   nitroGLYCERIN (NITROSTAT) 0.4  MG SL tablet Place 1 tablet (0.4 mg total) under the tongue every 5 (five) minutes as needed. 25 tablet 3   Plecanatide (TRULANCE) 3 MG TABS Take 1 each by mouth daily. 90 tablet 1   polyethylene glycol-electrolytes (NULYTELY) 420 g solution Take 4,000 mLs by mouth once.     telmisartan (MICARDIS) 20 MG tablet TAKE 1 TABLET BY MOUTH EVERY DAY 90 tablet 1   tirzepatide (MOUNJARO) 5 MG/0.5ML Pen Inject 5 mg into the skin once a week. 6 mL 1   levothyroxine (SYNTHROID) 100 MCG tablet Take 1 tablet (100 mcg total) by mouth daily. 30 tablet 3   No facility-administered medications prior to visit.    Allergies  Allergen Reactions   Lidocaine Anaphylaxis, Other (See Comments) and Nausea And Vomiting   Other Hives and Rash    SEA MOSS    ROS Review of Systems  Constitutional:  Positive for fatigue. Negative for chills and fever.  HENT:  Negative for congestion, postnasal drip, sinus pressure, sinus pain and sore throat.   Eyes:  Negative for pain and discharge.  Respiratory:  Negative for cough and shortness of breath.   Cardiovascular:  Positive for palpitations. Negative for chest pain.  Gastrointestinal:  Negative for abdominal pain, diarrhea, nausea and vomiting.  Endocrine: Positive for heat intolerance. Negative for polydipsia and polyuria.  Genitourinary:  Negative for dysuria and hematuria.  Musculoskeletal:  Positive for back pain. Negative for neck pain and neck stiffness.  Skin:  Negative for rash.  Neurological:  Positive for tremors. Negative for dizziness and weakness.  Psychiatric/Behavioral:  Negative for agitation and behavioral problems. The patient is nervous/anxious.       Objective:    Physical Exam Vitals reviewed.  Constitutional:      General: She is not in acute distress.    Appearance: She is not diaphoretic.  HENT:     Head: Normocephalic and atraumatic.     Nose: No congestion.     Mouth/Throat:     Mouth: Mucous membranes are moist.  Eyes:      General: No scleral icterus.    Extraocular Movements: Extraocular movements intact.  Cardiovascular:     Rate and Rhythm: Normal rate and regular rhythm.     Pulses: Normal pulses.     Heart sounds: Normal heart sounds. No murmur heard. Pulmonary:     Breath sounds: Normal breath sounds. No wheezing or rales.  Musculoskeletal:     Cervical back: Neck supple. No tenderness.     Lumbar back: Tenderness present. Decreased range of motion. Positive right straight leg raise test and positive left straight leg raise test.  Right lower leg: No edema.     Left lower leg: No edema.  Skin:    General: Skin is warm.     Findings: No rash.  Neurological:     General: No focal deficit present.     Mental Status: She is alert and oriented to person, place, and time.     Sensory: No sensory deficit.     Motor: Tremor (L > R hand) present. No weakness.  Psychiatric:        Mood and Affect: Mood normal.        Behavior: Behavior normal.     BP 136/82 (BP Location: Left Arm, Cuff Size: Normal)   Pulse (!) 114   Ht 5' 6"$  (1.676 m)   Wt 152 lb 3.2 oz (69 kg)   LMP 03/30/2011   SpO2 95%   BMI 24.57 kg/m  Wt Readings from Last 3 Encounters:  04/06/22 152 lb 3.2 oz (69 kg)  03/04/22 154 lb 9.6 oz (70.1 kg)  02/03/22 154 lb 12.8 oz (70.2 kg)    Lab Results  Component Value Date   TSH 0.071 (L) 02/23/2022   Lab Results  Component Value Date   WBC 6.5 02/23/2022   HGB 15.6 02/23/2022   HCT 45.5 02/23/2022   MCV 97 02/23/2022   PLT 263 02/23/2022   Lab Results  Component Value Date   NA 136 02/23/2022   K 5.0 02/23/2022   CO2 21 02/23/2022   GLUCOSE 125 (H) 02/23/2022   BUN 10 02/23/2022   CREATININE 0.68 02/23/2022   BILITOT 0.3 02/23/2022   ALKPHOS 60 02/23/2022   AST 19 02/23/2022   ALT 26 02/23/2022   PROT 7.1 02/23/2022   ALBUMIN 4.7 02/23/2022   CALCIUM 9.9 02/23/2022   EGFR 103 02/23/2022   GFR 105.90 04/22/2010   Lab Results  Component Value Date   CHOL 165  09/03/2019   Lab Results  Component Value Date   HDL 67 09/03/2019   Lab Results  Component Value Date   LDLCALC 79 09/03/2019   Lab Results  Component Value Date   TRIG 107 09/03/2019   Lab Results  Component Value Date   CHOLHDL 2.5 09/03/2019   Lab Results  Component Value Date   HGBA1C 6.1 (H) 02/23/2022      Assessment & Plan:   Problem List Items Addressed This Visit       Endocrine   Hypothyroidism    Lab Results  Component Value Date   TSH 0.071 (L) 02/23/2022  Decreased dose of levothyroxine to 75 mcg daily Symptoms slowly improving Was on NP thyroid 90 mg QD Chart review suggests normal TSH in the past as well      Relevant Medications   levothyroxine (SYNTHROID) 75 MCG tablet     Musculoskeletal and Integument   DDD (degenerative disc disease), lumbar - Primary    Has acute on chronic low back pain Has lumbar radiculopathy Check x-ray of lumbar spine Toradol and Depo-Medrol IM today Prednisone 20 mg X 5 days Advised to avoid NSAIDs while taking prednisone Avoid heavy lifting and frequent bending Simple back exercises advised Referred to PT      Relevant Medications   predniSONE (DELTASONE) 20 MG tablet   Other Relevant Orders   DG Lumbar Spine Complete   Ambulatory referral to Physical Therapy     Other   Tibial mass    His history of tibial mass removal Reports nerve injuries during the procedure Currently has intermittent  numbness of the LLE      Moderate episode of recurrent major depressive disorder ([place])    Uncontrolled Currently has uncontrolled thyroid profile, which can also contribute to anxiety/depression She is on Lexapro and Valium currently, followed by psychiatry      Meds ordered this encounter  Medications   levothyroxine (SYNTHROID) 75 MCG tablet    Sig: Take 1 tablet (75 mcg total) by mouth daily.    Dispense:  30 tablet    Refill:  3   predniSONE (DELTASONE) 20 MG tablet    Sig: Take 1 tablet (20 mg  total) by mouth daily with breakfast.    Dispense:  5 tablet    Refill:  0   ketorolac (TORADOL) injection 60 mg   methylPREDNISolone acetate (DEPO-MEDROL) injection 80 mg    Follow-up: Return if symptoms worsen or fail to improve.    Lindell Spar, MD

## 2022-04-06 NOTE — Assessment & Plan Note (Signed)
His history of tibial mass removal Reports nerve injuries during the procedure Currently has intermittent numbness of the LLE

## 2022-04-06 NOTE — Assessment & Plan Note (Signed)
Uncontrolled Currently has uncontrolled thyroid profile, which can also contribute to anxiety/depression She is on Lexapro and Valium currently, followed by psychiatry

## 2022-04-06 NOTE — Assessment & Plan Note (Signed)
Lab Results  Component Value Date   TSH 0.071 (L) 02/23/2022   Decreased dose of levothyroxine to 75 mcg daily Symptoms slowly improving Was on NP thyroid 90 mg QD Chart review suggests normal TSH in the past as well

## 2022-04-15 ENCOUNTER — Other Ambulatory Visit: Payer: Self-pay | Admitting: Internal Medicine

## 2022-04-27 ENCOUNTER — Other Ambulatory Visit: Payer: Self-pay

## 2022-04-27 ENCOUNTER — Ambulatory Visit (HOSPITAL_COMMUNITY): Payer: 59 | Attending: Internal Medicine | Admitting: Physical Therapy

## 2022-04-27 ENCOUNTER — Encounter (HOSPITAL_COMMUNITY): Payer: Self-pay | Admitting: Physical Therapy

## 2022-04-27 DIAGNOSIS — R29898 Other symptoms and signs involving the musculoskeletal system: Secondary | ICD-10-CM

## 2022-04-27 DIAGNOSIS — M5459 Other low back pain: Secondary | ICD-10-CM | POA: Diagnosis not present

## 2022-04-27 DIAGNOSIS — M6281 Muscle weakness (generalized): Secondary | ICD-10-CM | POA: Diagnosis not present

## 2022-04-27 DIAGNOSIS — M5136 Other intervertebral disc degeneration, lumbar region: Secondary | ICD-10-CM | POA: Diagnosis not present

## 2022-04-27 DIAGNOSIS — R2689 Other abnormalities of gait and mobility: Secondary | ICD-10-CM | POA: Diagnosis not present

## 2022-04-27 NOTE — Therapy (Signed)
OUTPATIENT PHYSICAL THERAPY THORACOLUMBAR EVALUATION   Patient Name: Gwendolyn [name] MRN: [id number] DOB:1965/07/23, 57 y.o., female Today's Date: 04/27/2022  END OF SESSION:  PT End of Session - 04/27/22 1352     Visit Number 1    Number of Visits 12    Authorization Type [employer]    Progress Note Due on Visit 10    PT Start Time [id number]    PT Stop Time 1451    PT Time Calculation (min) 58 min    Activity Tolerance Patient tolerated treatment well    Behavior During Therapy WFL for tasks assessed/performed             Past Medical History:  Diagnosis Date   Diabetes mellitus without complication ([place])    History of TIAs    Palpitations    Polycystic ovarian disease    Tobacco abuse    Past Surgical History:  Procedure Laterality Date   BREAST BIOPSY Left    TUBAL LIGATION     Patient Active Problem List   Diagnosis Date Noted   DDD (degenerative disc disease), lumbar 04/06/2022   Moderate episode of recurrent major depressive disorder ([place]) 04/06/2022   Mixed hyperlipidemia 03/04/2022   Menopausal hot flushes 02/04/2022   IBS (irritable bowel syndrome) 08/10/2021   Asthma 02/19/2021   Type 2 diabetes mellitus ([place]) 02/19/2021   Essential hypertension 02/19/2021   Hypothyroidism 10/21/2020   Tibial mass 05/17/2017   Lipoma of lower extremity 01/13/2017   Tobacco abuse    NIGHT SWEATS 04/21/2010    PCP: Lindell Spar, MD  REFERRING PROVIDER: Lindell Spar, MD  REFERRING DIAG: M51.36 (ICD-10-CM) - DDD (degenerative disc disease), lumbar  Rationale for Evaluation and Treatment: Rehabilitation  THERAPY DIAG:  Other low back pain  Muscle weakness (generalized)  Other abnormalities of gait and mobility  Other symptoms and signs involving the musculoskeletal system  ONSET DATE: one month  SUBJECTIVE:                                                                                                                                                                                            SUBJECTIVE STATEMENT: Patient states low back and leg symptoms. Symptoms began about a month ago after a trip to the beach causing back and leg pain. Acute on chronic symptoms. Has to do a lot of walking at work. Has to carry a book bag which presses on back and it hurts holding bag especially with increased weight. Enjoys gardening and shelling. Legs ache with increased activity.  PERTINENT HISTORY:  HTN, DM, hypothyroidism, Hx TIA, anxiety, hx LBP  PAIN:  Are you having pain? No none currently in low back  PRECAUTIONS: None  WEIGHT BEARING RESTRICTIONS: No  FALLS:  Has patient fallen in last 6 months? 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 No acute abnormalities.  PATIENT SURVEYS:  FOTO 44% function  SCREENING FOR RED FLAGS: Bowel or bladder incontinence: No Spinal tumors: No Cauda equina syndrome: No Compression fracture: No Abdominal aneurysm: No  COGNITION: Overall cognitive status: Within functional limits for tasks assessed     SENSATION: WFL   POSTURE: rounded shoulders, forward head, decreased lumbar lordosis, and decreased thoracic kyphosis  PALPATION: Grossly tender lumbar spine paraspinals, glute med/min, piriformis; hypomobile thoracic and lumbar spine; tender nodule/trigger point in L lower lumbar spine medial to psis on L   LUMBAR ROM:   AROM eval  Flexion 0% limited -  pain with returning to standing  Extension 25% limited *   Right lateral flexion 25% limited *   Left lateral flexion 25% limited *   Right rotation 0% limited  Left rotation 0% limited   (Blank rows = not tested)  LOWER EXTREMITY ROM:   WFL for tasks assessed  Active  Right eval Left eval  Hip flexion    Hip  extension    Hip abduction    Hip adduction    Hip internal rotation    Hip external rotation    Knee flexion    Knee extension    Ankle dorsiflexion    Ankle plantarflexion    Ankle inversion    Ankle eversion     (Blank rows = not tested)  LOWER EXTREMITY MMT:    MMT Right eval Left eval  Hip flexion 4+ 4  Hip extension 3+ 3+  Hip abduction 4 4  Hip adduction    Hip internal rotation    Hip external rotation    Knee flexion 5 4+*  Knee extension 5 5  Ankle dorsiflexion 5 5  Ankle plantarflexion    Ankle inversion    Ankle eversion     (Blank rows = not tested)  LUMBAR SPECIAL TESTS:  Femoral nerve tension test: muscles stretching, no change in symptoms Repeated prone press ups, limited ROM but no change in symptoms  GAIT: Distance walked: 400 feet Assistive device utilized: None Level of assistance: Complete Independence Comments: 2MWT, L sided low back pain gradually increasing, slight sway back posture  TODAY'S TREATMENT:                                                                                                                              DATE:  04/27/22 Evaluation and education  Repeated press ups - no change in symptoms   PATIENT EDUCATION:  Education details: Patient educated on exam findings, POC,  scope of PT,  and posture, dry needling, spinal anatomy. Person educated: Patient Education method: Explanation, Demonstration, and Handouts Education comprehension: verbalized understanding, returned demonstration, verbal cues required, and tactile cues required  HOME EXERCISE PROGRAM: Begin next session  ASSESSMENT:  CLINICAL IMPRESSION: Patient a 57 y.o. y.o. female who was seen today for physical therapy evaluation and treatment for low back pain. Patient presents with pain limited deficits in lumbar spine and LE strength, ROM, endurance, activity tolerance, posture, gait,  and functional mobility with ADL. Patient is having to modify and  restrict ADL as indicated by outcome measure score as well as subjective information and objective measures which is affecting overall participation. Patient will benefit from skilled physical therapy in order to improve function and reduce impairment.  OBJECTIVE IMPAIRMENTS: Abnormal gait, decreased activity tolerance, decreased balance, decreased endurance, decreased mobility, difficulty walking, decreased ROM, decreased strength, increased muscle spasms, impaired flexibility, improper body mechanics, postural dysfunction, and pain.   ACTIVITY LIMITATIONS: carrying, lifting, bending, standing, squatting, stairs, transfers, reach over head, locomotion level, and caring for others  PARTICIPATION LIMITATIONS: meal prep, cleaning, laundry, shopping, community activity, and yard work  PERSONAL FACTORS: 3+ comorbidities: HTN, DM, hypothyroidism, Hx TIA, anxiety, hx LBP  are also affecting patient's functional outcome.   REHAB POTENTIAL: Good  CLINICAL DECISION MAKING: Stable/uncomplicated  EVALUATION COMPLEXITY: Low   GOALS: Goals reviewed with patient? Yes  SHORT TERM GOALS: Target date: 05/18/2022   Patient will be independent with HEP in order to improve functional outcomes. Baseline:  Goal status: INITIAL  2.  Patient will report at least 25% improvement in symptoms for improved quality of life. Baseline:  Goal status: INITIAL    LONG TERM GOALS: Target date: 06/08/2022   Patient will report at least 75% improvement in symptoms for improved quality of life. Baseline:  Goal status: INITIAL  2.  Patient will improve FOTO score by at least 18 points in order to indicate improved tolerance to activity. Baseline: 44% function Goal status: INITIAL  3.  Patient will demonstrate at least 25% improvement in lumbar ROM in all restricted planes for improved ability to move trunk while completing chores. Baseline: see above Goal status: INITIAL  4.  Patient will be able to ambulate at  least 475 feet in 2MWT in order to demonstrate improved tolerance to activity. Baseline: 400 feet Goal status: INITIAL  5.  Patient will demonstrate grade of 5/5 MMT grade in all tested musculature as evidence of improved strength to assist with stair ambulation and gait.   Baseline: see above Goal status: INITIAL     PLAN:  PT FREQUENCY: 1-2x/week  PT DURATION: 6 weeks  PLANNED INTERVENTIONS: Therapeutic exercises, Therapeutic activity, Neuromuscular re-education, Balance training, Gait training, Patient/Family education, Joint manipulation, Joint mobilization, Stair training, Orthotic/Fit training, DME instructions, Aquatic Therapy, Dry Needling, Electrical stimulation, Spinal manipulation, Spinal mobilization, Cryotherapy, Moist heat, Compression bandaging, scar mobilization, Splintting, Taping, Traction, Ultrasound, Ionotophoresis '4mg'$ /ml Dexamethasone, and Manual therapy  PLAN FOR NEXT SESSION: possibly begin DN, core and glute strength, postural strength   Vianne Bulls Greogry Goodwyn, PT 04/27/2022, 3:12 PM

## 2022-05-05 ENCOUNTER — Encounter (HOSPITAL_COMMUNITY): Payer: 59 | Admitting: Physical Therapy

## 2022-05-05 ENCOUNTER — Other Ambulatory Visit (HOSPITAL_COMMUNITY): Payer: Self-pay

## 2022-05-05 DIAGNOSIS — F411 Generalized anxiety disorder: Secondary | ICD-10-CM | POA: Diagnosis not present

## 2022-05-05 DIAGNOSIS — F410 Panic disorder [episodic paroxysmal anxiety] without agoraphobia: Secondary | ICD-10-CM | POA: Diagnosis not present

## 2022-05-06 ENCOUNTER — Other Ambulatory Visit (HOSPITAL_COMMUNITY): Payer: Self-pay

## 2022-05-06 ENCOUNTER — Ambulatory Visit (HOSPITAL_COMMUNITY): Payer: 59 | Admitting: Physical Therapy

## 2022-05-06 DIAGNOSIS — M5136 Other intervertebral disc degeneration, lumbar region: Secondary | ICD-10-CM | POA: Diagnosis not present

## 2022-05-06 DIAGNOSIS — M5459 Other low back pain: Secondary | ICD-10-CM | POA: Diagnosis not present

## 2022-05-06 DIAGNOSIS — M6281 Muscle weakness (generalized): Secondary | ICD-10-CM | POA: Diagnosis not present

## 2022-05-06 DIAGNOSIS — R2689 Other abnormalities of gait and mobility: Secondary | ICD-10-CM | POA: Diagnosis not present

## 2022-05-06 DIAGNOSIS — R29898 Other symptoms and signs involving the musculoskeletal system: Secondary | ICD-10-CM | POA: Diagnosis not present

## 2022-05-06 NOTE — Patient Instructions (Signed)

## 2022-05-06 NOTE — Therapy (Signed)
OUTPATIENT PHYSICAL THERAPY THORACOLUMBAR EVALUATION   Patient Name: Gwendolyn [name] MRN: [id number] DOB:[birth year], 57 y.o., female Today's Date: 05/06/2022  END OF SESSION:  PT End of Session - 05/06/22 0913     Visit Number 2    Number of Visits 12    Authorization Type [employer]    Progress Note Due on Visit 10    PT Start Time 0904    PT Stop Time 0943    PT Time Calculation (min) 39 min    Activity Tolerance Patient tolerated treatment well    Behavior During Therapy [facility] for tasks assessed/performed             Past Medical History:  Diagnosis Date   Diabetes mellitus without complication ([place])    History of TIAs    Palpitations    Polycystic ovarian disease    Tobacco abuse    Past Surgical History:  Procedure Laterality Date   BREAST BIOPSY Left    TUBAL LIGATION     Patient Active Problem List   Diagnosis Date Noted   DDD (degenerative disc disease), lumbar 04/06/2022   Moderate episode of recurrent major depressive disorder ([place]) 04/06/2022   Mixed hyperlipidemia 03/04/2022   Menopausal hot flushes 02/04/2022   IBS (irritable bowel syndrome) 08/10/2021   Asthma 02/19/2021   Type 2 diabetes mellitus ([place]) 02/19/2021   Essential hypertension 02/19/2021   Hypothyroidism 10/21/2020   Tibial mass 05/17/2017   Lipoma of lower extremity 01/13/2017   Tobacco abuse    NIGHT SWEATS 04/21/2010    PCP: Lindell Spar, MD  REFERRING PROVIDER: Lindell Spar, MD  REFERRING DIAG: M51.36 (ICD-10-CM) - DDD (degenerative disc disease), lumbar  Rationale for Evaluation and Treatment: Rehabilitation  THERAPY DIAG:  Other low back pain  ONSET DATE: one month  SUBJECTIVE:                                                                                                                                                                                           SUBJECTIVE STATEMENT: Patient reports ongoing low back pain.   Eval: Patient states low back and leg  symptoms. Symptoms began about a month ago after a trip to the beach causing back and leg pain. Acute on chronic symptoms. Has to do a lot of walking at work. Has to carry a book bag which presses on back and it hurts holding bag especially with increased weight. Enjoys gardening and shelling. Legs ache with increased activity.  PERTINENT HISTORY:  HTN, DM, hypothyroidism, Hx TIA, anxiety, hx LBP  PAIN:  Are you having pain? No and Yes: NPRS scale: 5/10 Pain  location: Low back  Pain description: aching, sharp Aggravating factors: walking, sitting for too long  Relieving factors: sitting    PRECAUTIONS: None  WEIGHT BEARING RESTRICTIONS: No  FALLS:  Has patient fallen in last 6 months? 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 No acute abnormalities.  PATIENT SURVEYS:  FOTO 44% function  SCREENING FOR RED FLAGS: Bowel or bladder incontinence: No Spinal tumors: No Cauda equina syndrome: No Compression fracture: No Abdominal aneurysm: No  COGNITION: Overall cognitive status: Within functional limits for tasks assessed     SENSATION: WFL   POSTURE: rounded shoulders, forward head, decreased lumbar lordosis, and decreased thoracic kyphosis  PALPATION: Grossly tender lumbar spine paraspinals, glute med/min, piriformis; hypomobile thoracic and lumbar spine; tender nodule/trigger point in L lower lumbar spine medial to psis on L   LUMBAR ROM:   AROM eval  Flexion 0% limited -  pain with returning to standing  Extension 25% limited *   Right lateral flexion 25% limited *   Left lateral flexion 25% limited *   Right rotation 0% limited  Left rotation 0% limited   (Blank rows = not tested)  LOWER EXTREMITY ROM:   WFL for tasks assessed  Active   Right eval Left eval  Hip flexion    Hip extension    Hip abduction    Hip adduction    Hip internal rotation    Hip external rotation    Knee flexion    Knee extension    Ankle dorsiflexion    Ankle plantarflexion    Ankle inversion    Ankle eversion     (Blank rows = not tested)  LOWER EXTREMITY MMT:    MMT Right eval Left eval  Hip flexion 4+ 4  Hip extension 3+ 3+  Hip abduction 4 4   Hip adduction    Hip internal rotation    Hip external rotation    Knee flexion 5 4+*  Knee extension 5 5  Ankle dorsiflexion 5 5  Ankle plantarflexion    Ankle inversion    Ankle eversion     (Blank rows = not tested)  LUMBAR SPECIAL TESTS:  Femoral nerve tension test: muscles stretching, no change in symptoms Repeated prone press ups, limited ROM but no change in symptoms  GAIT: Distance walked: 400 feet Assistive device utilized: None Level of assistance: Complete Independence Comments: 2MWT, L sided low back pain gradually increasing, slight sway back posture  TODAY'S TREATMENT:                                                                                                                              DATE:  05/06/22 SKTC 10 x 5" LTR 10 x 5"  Bridge 5 x 5"  Manual STM to LT lateral erectors, multifidus around L4 pre and post dry needling for trigger point identification and surface area preparation    Trigger Point Dry-Needling  Treatment instructions: Expect mild to moderate muscle soreness. S/S of pneumothorax if dry needled over a lung field, and to seek immediate medical attention should they occur. Patient verbalized understanding of these instructions and education.  Patient Consent Given: Yes Education handout provided: Yes Muscles treated: LT lateral erectors, multifidus around L4 Electrical stimulation performed: No Parameters: N/A Treatment response/outcome: Notes trigger point pain during    04/27/22 Evaluation and education  Repeated press ups - no  change in symptoms   PATIENT EDUCATION:  Education details: Patient educated on exam findings, POC, scope of PT,  and posture, dry needling, spinal anatomy. Person educated: Patient Education method: Explanation, Demonstration, and Handouts Education comprehension: verbalized understanding, returned demonstration, verbal cues required, and tactile cues required  HOME EXERCISE PROGRAM: Access Code: [email or web address] URL: [email or web address] Date: 05/06/2022 Prepared by: Josue Hector  Exercises - Hooklying Single Knee to Chest Stretch  - 2 x daily - 7 x weekly - 1 sets - 10 reps - 5 second hold - Supine Lower Trunk Rotation  - 2 x daily - 7 x weekly - 1 sets - 10 reps - 5 second hold - Supine Bridge  - 2 x daily - 7 x weekly - 1-2 sets - 10 reps - 5 second hold  ASSESSMENT:  CLINICAL IMPRESSION: Initiated there ex. Educated patient on dry needling and lumbar anatomy. Performed dry needling with patient reporting increased trigger point pain during, but slight improvement in pain sx following. Added stretching for lumbar and hip mobility, also bridge for glute strengthening. Patient educated on purpose and function of all added activity. Issued HEP handout. Patient will continue to benefit from skilled therapy services to reduce remaining deficits and improve functional ability.    OBJECTIVE IMPAIRMENTS: Abnormal gait, decreased activity tolerance, decreased balance, decreased endurance, decreased mobility, difficulty walking, decreased ROM, decreased strength, increased muscle spasms, impaired flexibility, improper body mechanics, postural dysfunction, and pain.   ACTIVITY LIMITATIONS: carrying, lifting, bending, standing, squatting, stairs, transfers, reach over head, locomotion level, and caring for others  PARTICIPATION LIMITATIONS: meal prep, cleaning, laundry, shopping, community activity, and yard work  PERSONAL FACTORS: 3+ comorbidities: HTN, DM, hypothyroidism, Hx TIA,  anxiety, hx LBP  are also affecting patient's functional outcome.   REHAB POTENTIAL: Good  CLINICAL DECISION MAKING: Stable/uncomplicated  EVALUATION COMPLEXITY: Low   GOALS: Goals reviewed with patient? Yes  SHORT TERM GOALS: Target date: 05/18/2022   Patient will be independent with HEP in order to improve functional outcomes. Baseline:  Goal status: INITIAL  2.  Patient will report at least 25% improvement in symptoms for improved quality of life. Baseline:  Goal status: INITIAL    LONG TERM GOALS: Target date: 06/08/2022   Patient will report at least 75% improvement in symptoms for improved quality of life. Baseline:  Goal status: INITIAL  2.  Patient will improve FOTO score by at least 18 points in order to indicate improved tolerance to activity. Baseline: 44% function Goal status: INITIAL  3.  Patient will demonstrate at least 25% improvement in lumbar ROM in all restricted planes for improved ability to move trunk while completing chores. Baseline: see above Goal status: INITIAL  4.  Patient will be able to ambulate at least 475 feet in 2MWT in order to demonstrate improved tolerance to activity. Baseline: 400 feet Goal status: INITIAL  5.  Patient will demonstrate grade of 5/5 MMT grade in all tested musculature as evidence of improved strength to assist with stair ambulation and gait.   Baseline: see above Goal status: INITIAL     PLAN:  PT FREQUENCY: 1-2x/week  PT DURATION: 6 weeks  PLANNED INTERVENTIONS: Therapeutic exercises, Therapeutic activity, Neuromuscular re-education, Balance training, Gait training, Patient/Family education, Joint manipulation, Joint mobilization, Stair training, Orthotic/Fit training, DME instructions, Aquatic Therapy, Dry Needling, Electrical stimulation, Spinal manipulation, Spinal mobilization, Cryotherapy, Moist heat, Compression bandaging, scar mobilization, Splintting, Taping, Traction, Ultrasound, Ionotophoresis 4mg /ml  Dexamethasone, and Manual therapy  PLAN FOR NEXT SESSION: f/u on DN, core and glute strength, postural strength   9:45 AM, 05/06/22 Josue Hector PT DPT  Physical Therapist with [facility]  [phone]

## 2022-05-13 ENCOUNTER — Ambulatory Visit (HOSPITAL_COMMUNITY): Payer: 59 | Admitting: Physical Therapy

## 2022-05-13 DIAGNOSIS — R2689 Other abnormalities of gait and mobility: Secondary | ICD-10-CM | POA: Diagnosis not present

## 2022-05-13 DIAGNOSIS — M5459 Other low back pain: Secondary | ICD-10-CM | POA: Diagnosis not present

## 2022-05-13 DIAGNOSIS — M5136 Other intervertebral disc degeneration, lumbar region: Secondary | ICD-10-CM | POA: Diagnosis not present

## 2022-05-13 DIAGNOSIS — R29898 Other symptoms and signs involving the musculoskeletal system: Secondary | ICD-10-CM | POA: Diagnosis not present

## 2022-05-13 DIAGNOSIS — M6281 Muscle weakness (generalized): Secondary | ICD-10-CM | POA: Diagnosis not present

## 2022-05-13 NOTE — Therapy (Signed)
OUTPATIENT PHYSICAL THERAPY THORACOLUMBAR EVALUATION   Patient Name: Gwendolyn [name] MRN: [id number] DOB:11-02-1965, 57 y.o., female Today's Date: 05/13/2022  END OF SESSION:  PT End of Session - 05/13/22 0908     Visit Number 3    Number of Visits 12    Authorization Type Aetna    Progress Note Due on Visit 10    PT Start Time 0906    PT Stop Time 0944    PT Time Calculation (min) 38 min    Activity Tolerance Patient tolerated treatment well    Behavior During Therapy [facility] for tasks assessed/performed             Past Medical History:  Diagnosis Date   Diabetes mellitus without complication ([place])    History of TIAs    Palpitations    Polycystic ovarian disease    Tobacco abuse    Past Surgical History:  Procedure Laterality Date   BREAST BIOPSY Left    TUBAL LIGATION     Patient Active Problem List   Diagnosis Date Noted   DDD (degenerative disc disease), lumbar 04/06/2022   Moderate episode of recurrent major depressive disorder ([place]) 04/06/2022   Mixed hyperlipidemia 03/04/2022   Menopausal hot flushes 02/04/2022   IBS (irritable bowel syndrome) 08/10/2021   Asthma 02/19/2021   Type 2 diabetes mellitus ([place]) 02/19/2021   Essential hypertension 02/19/2021   Hypothyroidism 10/21/2020   Tibial mass 05/17/2017   Lipoma of lower extremity 01/13/2017   Tobacco abuse    NIGHT SWEATS 04/21/2010    PCP: Lindell Spar, MD  REFERRING PROVIDER: Lindell Spar, MD  REFERRING DIAG: M51.36 (ICD-10-CM) - DDD (degenerative disc disease), lumbar  Rationale for Evaluation and Treatment: Rehabilitation  THERAPY DIAG:  Other low back pain  ONSET DATE: one month  SUBJECTIVE:                                                                                                                                                                                           SUBJECTIVE STATEMENT: Patient states dry needling helped. She had little to no pain for a few days. Is  having some pain today. About a 4. Did fine with HEP.    Eval: Patient states low back and leg symptoms. Symptoms began about a month ago after a trip to the beach causing back and leg pain. Acute on chronic symptoms. Has to do a lot of walking at work. Has to carry a book bag which presses on back and it hurts holding bag especially with increased weight. Enjoys gardening and shelling. Legs ache with increased activity.  PERTINENT HISTORY:  HTN, DM, hypothyroidism, Hx TIA, anxiety, hx LBP  PAIN:  Are you having pain? No and Yes: NPRS scale: 4/10 Pain location: Low back  Pain description: aching, sharp Aggravating factors: walking, sitting for too long  Relieving factors: sitting    PRECAUTIONS: None  WEIGHT BEARING RESTRICTIONS: No  FALLS:  Has patient fallen in last 6 months? 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 No acute abnormalities.  PATIENT SURVEYS:  FOTO 44% function  SCREENING FOR RED FLAGS: Bowel or bladder incontinence: No Spinal tumors: No Cauda equina syndrome: No Compression fracture: No Abdominal aneurysm: No  COGNITION: Overall cognitive status: Within functional limits for tasks assessed     SENSATION: WFL   POSTURE: rounded shoulders, forward head, decreased lumbar lordosis, and decreased thoracic kyphosis  PALPATION: Grossly tender lumbar spine paraspinals, glute med/min, piriformis; hypomobile thoracic and lumbar spine; tender nodule/trigger point in L lower lumbar spine medial to psis on L   LUMBAR ROM:   AROM eval  Flexion 0% limited -  pain with returning to standing  Extension 25% limited *   Right lateral flexion 25% limited *   Left lateral flexion 25% limited *   Right rotation 0% limited  Left rotation  0% limited   (Blank rows = not tested)  LOWER EXTREMITY ROM:   WFL for tasks assessed  Active  Right eval Left eval  Hip flexion    Hip extension    Hip abduction    Hip adduction    Hip internal rotation    Hip external rotation    Knee flexion    Knee extension    Ankle dorsiflexion    Ankle plantarflexion    Ankle inversion    Ankle eversion     (Blank rows = not tested)  LOWER EXTREMITY MMT:    MMT Right eval Left eval  Hip flexion 4+ 4  Hip extension 3+ 3+  Hip abduction 4 4   Hip adduction    Hip internal rotation    Hip external rotation    Knee flexion 5 4+*  Knee extension 5 5  Ankle dorsiflexion 5 5  Ankle plantarflexion    Ankle inversion    Ankle eversion     (Blank rows = not tested)  LUMBAR SPECIAL TESTS:  Femoral nerve tension test: muscles stretching, no change in symptoms Repeated prone press ups, limited ROM but no change in symptoms  GAIT: Distance walked: 400 feet Assistive device utilized: None Level of assistance: Complete Independence Comments: 2MWT, L sided low back pain gradually increasing, slight sway back posture  TODAY'S TREATMENT:  DATE:  05/13/22 SKTC 10 x 5" LTR 10 x 5"  Ab brace 10 x 5"  Ab march 2 x 10 SLR with ab brace 2 x10   Bridge 10 x 5"   Sidelying hip abduction 2 x 10  Manual STM to LT lateral erectors, multifidus and lumbar paraspinal at L3-4 pre and post dry needling for trigger point identification and surface area preparation    Trigger Point Dry-Needling  Treatment instructions: Expect mild to moderate muscle soreness. S/S of pneumothorax if dry needled over a lung field, and to seek immediate medical attention should they occur. Patient verbalized understanding of these instructions and education.  Patient Consent Given: Yes Education handout provided: Yes Muscles treated: LT  lateral erectors, multifidus and lumbar paraspinal at L3-4 Electrical stimulation performed: No Parameters: N/A Treatment response/outcome: Notes trigger point pain during    05/06/22 SKTC 10 x 5" LTR 10 x 5"  Bridge 5 x 5"    Manual STM to LT lateral erectors, multifidus around L4 pre and post dry needling for trigger point identification and surface area preparation    Trigger Point Dry-Needling  Treatment instructions: Expect mild to moderate muscle soreness. S/S of pneumothorax if dry needled over a lung field, and to seek immediate medical attention should they occur. Patient verbalized understanding of these instructions and education.  Patient Consent Given: Yes Education handout provided: Yes Muscles treated: LT lateral erectors, multifidus around L4 Electrical stimulation performed: No Parameters: N/A Treatment response/outcome: Notes trigger point pain during    04/27/22 Evaluation and education  Repeated press ups - no change in symptoms   PATIENT EDUCATION:  Education details: Patient educated on exam findings, POC, scope of PT,  and posture, dry needling, spinal anatomy. Person educated: Patient Education method: Explanation, Demonstration, and Handouts Education comprehension: verbalized understanding, returned demonstration, verbal cues required, and tactile cues required  HOME EXERCISE PROGRAM: Access Code: [email or web address] URL: [email or web address]  05/13/22 - Supine Transversus Abdominis Bracing - Hands on Stomach  - 2 x daily - 7 x weekly - 1 sets - 10 reps - 5 second hold - Supine March  - 2 x daily - 7 x weekly - 2 sets - 10 reps - Supine Straight Leg Raises  - 2 x daily - 7 x weekly - 2 sets - 10 reps - Sidelying Hip Abduction  - 2 x daily - 7 x weekly - 2 sets - 10 reps  Date: 05/06/2022 Prepared by: Josue Hector  Exercises - Hooklying Single Knee to Chest Stretch  - 2 x daily - 7 x weekly - 1 sets - 10 reps - 5 second hold - Supine Lower  Trunk Rotation  - 2 x daily - 7 x weekly - 1 sets - 10 reps - 5 second hold - Supine Bridge  - 2 x daily - 7 x weekly - 1-2 sets - 10 reps - 5 second hold  ASSESSMENT:  CLINICAL IMPRESSION: Progressed glute and core strengthening activity today. Patient performed well overall. Patient educated on purpose of added exercise and cued on proper form and hold times for improved target muscle activity. Patient noting mild muscle fatigue and occasional RT hamstring cramps with knees in flexed position. Added lumbar paraspinal to dry needling target today. Patient with some hypersensitivity about L4 paraspinals but demos decreased reaction about LT lateral spinal erectors/ multifidus at same level. Patient will continue to benefit from skilled therapy services to reduce remaining deficits and improve functional ability.    OBJECTIVE IMPAIRMENTS: Abnormal  gait, decreased activity tolerance, decreased balance, decreased endurance, decreased mobility, difficulty walking, decreased ROM, decreased strength, increased muscle spasms, impaired flexibility, improper body mechanics, postural dysfunction, and pain.   ACTIVITY LIMITATIONS: carrying, lifting, bending, standing, squatting, stairs, transfers, reach over head, locomotion level, and caring for others  PARTICIPATION LIMITATIONS: meal prep, cleaning, laundry, shopping, community activity, and yard work  PERSONAL FACTORS: 3+ comorbidities: HTN, DM, hypothyroidism, Hx TIA, anxiety, hx LBP  are also affecting patient's functional outcome.   REHAB POTENTIAL: Good  CLINICAL DECISION MAKING: Stable/uncomplicated  EVALUATION COMPLEXITY: Low   GOALS: Goals reviewed with patient? Yes  SHORT TERM GOALS: Target date: 05/18/2022   Patient will be independent with HEP in order to improve functional outcomes. Baseline:  Goal status: INITIAL  2.  Patient will report at least 25% improvement in symptoms for improved quality of life. Baseline:  Goal status:  INITIAL    LONG TERM GOALS: Target date: 06/08/2022   Patient will report at least 75% improvement in symptoms for improved quality of life. Baseline:  Goal status: INITIAL  2.  Patient will improve FOTO score by at least 18 points in order to indicate improved tolerance to activity. Baseline: 44% function Goal status: INITIAL  3.  Patient will demonstrate at least 25% improvement in lumbar ROM in all restricted planes for improved ability to move trunk while completing chores. Baseline: see above Goal status: INITIAL  4.  Patient will be able to ambulate at least 475 feet in 2MWT in order to demonstrate improved tolerance to activity. Baseline: 400 feet Goal status: INITIAL  5.  Patient will demonstrate grade of 5/5 MMT grade in all tested musculature as evidence of improved strength to assist with stair ambulation and gait.   Baseline: see above Goal status: INITIAL     PLAN:  PT FREQUENCY: 1-2x/week  PT DURATION: 6 weeks  PLANNED INTERVENTIONS: Therapeutic exercises, Therapeutic activity, Neuromuscular re-education, Balance training, Gait training, Patient/Family education, Joint manipulation, Joint mobilization, Stair training, Orthotic/Fit training, DME instructions, Aquatic Therapy, Dry Needling, Electrical stimulation, Spinal manipulation, Spinal mobilization, Cryotherapy, Moist heat, Compression bandaging, scar mobilization, Splintting, Taping, Traction, Ultrasound, Ionotophoresis 4mg /ml Dexamethasone, and Manual therapy  PLAN FOR NEXT SESSION: f/u on DN, core and glute strength, postural strength   9:43 AM, 05/13/22  Josue Hector PT DPT  Physical Therapist with [facility]  [phone]

## 2022-05-14 ENCOUNTER — Encounter: Payer: Self-pay | Admitting: Internal Medicine

## 2022-05-14 ENCOUNTER — Other Ambulatory Visit: Payer: Self-pay

## 2022-05-14 ENCOUNTER — Other Ambulatory Visit: Payer: Self-pay | Admitting: Internal Medicine

## 2022-05-14 DIAGNOSIS — E1169 Type 2 diabetes mellitus with other specified complication: Secondary | ICD-10-CM

## 2022-05-14 MED ORDER — METFORMIN HCL ER 500 MG PO TB24: 500.0000 mg | ORAL_TABLET | Freq: Every day | ORAL | 1 refills | Status: DC

## 2022-05-18 ENCOUNTER — Ambulatory Visit (HOSPITAL_COMMUNITY): Payer: 59 | Attending: Internal Medicine | Admitting: Physical Therapy

## 2022-05-18 DIAGNOSIS — R29898 Other symptoms and signs involving the musculoskeletal system: Secondary | ICD-10-CM | POA: Diagnosis not present

## 2022-05-18 DIAGNOSIS — R2689 Other abnormalities of gait and mobility: Secondary | ICD-10-CM | POA: Insufficient documentation

## 2022-05-18 DIAGNOSIS — M5459 Other low back pain: Secondary | ICD-10-CM | POA: Insufficient documentation

## 2022-05-18 DIAGNOSIS — M6281 Muscle weakness (generalized): Secondary | ICD-10-CM | POA: Insufficient documentation

## 2022-05-18 NOTE — Therapy (Signed)
OUTPATIENT PHYSICAL THERAPY THORACOLUMBAR EVALUATION   Patient Name: Gwendolyn [name] MRN: [id number] DOB:18-Apr-1965, 57 y.o., female Today's Date: 05/18/2022  END OF SESSION:  PT End of Session - 05/18/22 1529     Visit Number 4    Number of Visits 12    Authorization Type [employer]et[employer]    Progress Note Due on Visit 10    PT Start Time 1530    PT Stop Time 1600    PT Time Calculation (min) 30 min    Activity Tolerance Patient tolerated treatment well    Behavior During Therapy WFL for tasks assessed/performed             Past Medical History:  Diagnosis Date   Diabetes mellitus without complication ([place])    History of TIAs    Palpitations    Polycystic ovarian disease    Tobacco abuse    Past Surgical History:  Procedure Laterality Date   BREAST BIOPSY Left    TUBAL LIGATION     Patient Active Problem List   Diagnosis Date Noted   DDD (degenerative disc disease), lumbar 04/06/2022   Moderate episode of recurrent major depressive disorder 04/06/2022   Mixed hyperlipidemia 03/04/2022   Menopausal hot flushes 02/04/2022   IBS (irritable bowel syndrome) 08/10/2021   Asthma 02/19/2021   Type 2 diabetes mellitus 02/19/2021   Essential hypertension 02/19/2021   Hypothyroidism 10/21/2020   Tibial mass 05/17/2017   Lipoma of lower extremity 01/13/2017   Tobacco abuse    NIGHT SWEATS 04/21/2010    PCP: Lindell Spar, MD  REFERRING PROVIDER: Lindell Spar, MD  REFERRING DIAG: M51.36 (ICD-10-CM) - DDD (degenerative disc disease), lumbar  Rationale for Evaluation and Treatment: Rehabilitation  THERAPY DIAG:  Other low back pain  ONSET DATE: one month  SUBJECTIVE:                                                                                                                                                                                           SUBJECTIVE STATEMENT:  Does not want needling today. Hurt last time. Doing good with HEP for the most part.   Eval:  Patient states low back and leg symptoms. Symptoms began about a month ago after a trip to the beach causing back and leg pain. Acute on chronic symptoms. Has to do a lot of walking at work. Has to carry a book bag which presses on back and it hurts holding bag especially with increased weight. Enjoys gardening and shelling. Legs ache with increased activity.  PERTINENT HISTORY:  HTN, DM, hypothyroidism, Hx TIA, anxiety, hx LBP  PAIN:  Are you  having pain? No and Yes: NPRS scale: 5/10 Pain location: Low back  Pain description: aching, sharp Aggravating factors: walking, sitting for too long  Relieving factors: sitting    PRECAUTIONS: None  WEIGHT BEARING RESTRICTIONS: No  FALLS:  Has patient fallen in last 6 months? 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 No acute abnormalities.  PATIENT SURVEYS:  FOTO 44% function  SCREENING FOR RED FLAGS: Bowel or bladder incontinence: No Spinal tumors: No Cauda equina syndrome: No Compression fracture: No Abdominal aneurysm: No  COGNITION: Overall cognitive status: Within functional limits for tasks assessed     SENSATION: WFL   POSTURE: rounded shoulders, forward head, decreased lumbar lordosis, and decreased thoracic kyphosis  PALPATION: Grossly tender lumbar spine paraspinals, glute med/min, piriformis; hypomobile thoracic and lumbar spine; tender nodule/trigger point in L lower lumbar spine medial to psis on L   LUMBAR ROM:   AROM eval  Flexion 0% limited -  pain with returning to standing  Extension 25% limited *   Right lateral flexion 25% limited *   Left lateral flexion 25% limited *   Right rotation 0% limited  Left rotation 0% limited   (Blank rows = not tested)  LOWER EXTREMITY ROM:    WFL for tasks assessed  Active  Right eval Left eval  Hip flexion    Hip extension    Hip abduction    Hip adduction    Hip internal rotation    Hip external rotation    Knee flexion    Knee extension    Ankle dorsiflexion    Ankle plantarflexion    Ankle inversion    Ankle eversion     (Blank rows = not tested)  LOWER EXTREMITY MMT:    MMT Right eval Left eval  Hip flexion 4+ 4  Hip extension 3+ 3+  Hip abduction 4 4   Hip adduction    Hip internal rotation    Hip external rotation    Knee flexion 5 4+*  Knee extension 5 5  Ankle dorsiflexion 5 5  Ankle plantarflexion    Ankle inversion    Ankle eversion     (Blank rows = not tested)  LUMBAR SPECIAL TESTS:  Femoral nerve tension test: muscles stretching, no change in symptoms Repeated prone press ups, limited ROM but no change in symptoms  GAIT: Distance walked: 400 feet Assistive device utilized: None Level of assistance: Complete Independence Comments: 2MWT, L sided low back pain gradually increasing, slight sway back posture  TODAY'S TREATMENT:                                                                                                                              DATE:  05/18/22 SKTC 10 x 5"  LTR with green ball 10 x 5"  DKTC with green ball 10 x 5" each  Ab brace 10 x 5"  Ab march 2 x 10 SLR with ab brace 2 x10   Deadbug 2 x 10  Bridge 20x 5"    05/13/22 SKTC 10 x 5" LTR 10 x 5"  Ab brace 10 x 5"  Ab march 2 x 10 SLR with ab brace 2 x10   Bridge 10 x 5"   Sidelying hip abduction 2 x 10  Manual STM to LT lateral erectors, multifidus and lumbar paraspinal at L3-4 pre and post dry needling for trigger point identification and surface area preparation    Trigger Point Dry-Needling  Treatment instructions: Expect mild to moderate muscle soreness. S/S of pneumothorax if dry needled over a lung field, and to seek immediate medical attention should they occur. Patient verbalized understanding of  these instructions and education.  Patient Consent Given: Yes Education handout provided: Yes Muscles treated: LT lateral erectors, multifidus and lumbar paraspinal at L3-4 Electrical stimulation performed: No Parameters: N/A Treatment response/outcome: Notes trigger point pain during    05/06/22 SKTC 10 x 5" LTR 10 x 5"  Bridge 5 x 5"    Manual STM to LT lateral erectors, multifidus around L4 pre and post dry needling for trigger point identification and surface area preparation    Trigger Point Dry-Needling  Treatment instructions: Expect mild to moderate muscle soreness. S/S of pneumothorax if dry needled over a lung field, and to seek immediate medical attention should they occur. Patient verbalized understanding of these instructions and education.  Patient Consent Given: Yes Education handout provided: Yes Muscles treated: LT lateral erectors, multifidus around L4 Electrical stimulation performed: No Parameters: N/A Treatment response/outcome: Notes trigger point pain during    04/27/22 Evaluation and education  Repeated press ups - no change in symptoms   PATIENT EDUCATION:  Education details: Patient educated on exam findings, POC, scope of PT,  and posture, dry needling, spinal anatomy. Person educated: Patient Education method: Explanation, Demonstration, and Handouts Education comprehension: verbalized understanding, returned demonstration, verbal cues required, and tactile cues required  HOME EXERCISE PROGRAM: Access Code: [email or web address] URL: [email or web address] 17-Jun-2022 - Dead Bug  - 2 x daily - 7 x weekly - 2 sets - 10 reps  05/13/22 - Supine Transversus Abdominis Bracing - Hands on Stomach  - 2 x daily - 7 x weekly - 1 sets - 10 reps - 5 second hold - Supine March  - 2 x daily - 7 x weekly - 2 sets - 10 reps - Supine Straight Leg Raises  - 2 x daily - 7 x weekly - 2 sets - 10 reps - Sidelying Hip Abduction  - 2 x daily - 7 x weekly - 2 sets - 10  reps  Date: 05/06/2022 Prepared by: Josue Hector  Exercises - Hooklying Single Knee to Chest Stretch  - 2 x daily - 7 x weekly - 1 sets - 10 reps - 5 second hold - Supine Lower Trunk Rotation  - 2 x daily - 7 x weekly - 1 sets - 10 reps - 5 second hold - Supine Bridge  - 2 x daily - 7 x weekly - 1-2 sets - 10 reps - 5 second hold   ASSESSMENT:  CLINICAL IMPRESSION: Held on dry needling per patient subjective. Progressed core strengthening exercise per flow sheet. Patient tolerated well. Educated patient on purpose and function of all added exercise. Patient will continue to benefit  from skilled therapy services to reduce remaining deficits and improve functional ability.    OBJECTIVE IMPAIRMENTS: Abnormal gait, decreased activity tolerance, decreased balance, decreased endurance, decreased mobility, difficulty walking, decreased ROM, decreased strength, increased muscle spasms, impaired flexibility, improper body mechanics, postural dysfunction, and pain.   ACTIVITY LIMITATIONS: carrying, lifting, bending, standing, squatting, stairs, transfers, reach over head, locomotion level, and caring for others  PARTICIPATION LIMITATIONS: meal prep, cleaning, laundry, shopping, community activity, and yard work  PERSONAL FACTORS: 3+ comorbidities: HTN, DM, hypothyroidism, Hx TIA, anxiety, hx LBP  are also affecting patient's functional outcome.   REHAB POTENTIAL: Good  CLINICAL DECISION MAKING: Stable/uncomplicated  EVALUATION COMPLEXITY: Low   GOALS: Goals reviewed with patient? Yes  SHORT TERM GOALS: Target date: 05/18/2022   Patient will be independent with HEP in order to improve functional outcomes. Baseline:  Goal status: INITIAL  2.  Patient will report at least 25% improvement in symptoms for improved quality of life. Baseline:  Goal status: INITIAL    LONG TERM GOALS: Target date: 06/08/2022   Patient will report at least 75% improvement in symptoms for improved quality  of life. Baseline:  Goal status: INITIAL  2.  Patient will improve FOTO score by at least 18 points in order to indicate improved tolerance to activity. Baseline: 44% function Goal status: INITIAL  3.  Patient will demonstrate at least 25% improvement in lumbar ROM in all restricted planes for improved ability to move trunk while completing chores. Baseline: see above Goal status: INITIAL  4.  Patient will be able to ambulate at least 475 feet in 2MWT in order to demonstrate improved tolerance to activity. Baseline: 400 feet Goal status: INITIAL  5.  Patient will demonstrate grade of 5/5 MMT grade in all tested musculature as evidence of improved strength to assist with stair ambulation and gait.   Baseline: see above Goal status: INITIAL     PLAN:  PT FREQUENCY: 1-2x/week  PT DURATION: 6 weeks  PLANNED INTERVENTIONS: Therapeutic exercises, Therapeutic activity, Neuromuscular re-education, Balance training, Gait training, Patient/Family education, Joint manipulation, Joint mobilization, Stair training, Orthotic/Fit training, DME instructions, Aquatic Therapy, Dry Needling, Electrical stimulation, Spinal manipulation, Spinal mobilization, Cryotherapy, Moist heat, Compression bandaging, scar mobilization, Splintting, Taping, Traction, Ultrasound, Ionotophoresis 4mg /ml Dexamethasone, and Manual therapy  PLAN FOR NEXT SESSION: core and glute strength, postural strength   4:49 PM, 05/18/22  Josue Hector PT DPT  Physical Therapist with [facility]  [phone]

## 2022-06-01 ENCOUNTER — Ambulatory Visit (HOSPITAL_COMMUNITY): Payer: 59 | Admitting: Physical Therapy

## 2022-06-01 DIAGNOSIS — M5459 Other low back pain: Secondary | ICD-10-CM

## 2022-06-01 DIAGNOSIS — M6281 Muscle weakness (generalized): Secondary | ICD-10-CM | POA: Diagnosis not present

## 2022-06-01 DIAGNOSIS — R2689 Other abnormalities of gait and mobility: Secondary | ICD-10-CM | POA: Diagnosis not present

## 2022-06-01 DIAGNOSIS — R29898 Other symptoms and signs involving the musculoskeletal system: Secondary | ICD-10-CM | POA: Diagnosis not present

## 2022-06-01 NOTE — Therapy (Signed)
OUTPATIENT PHYSICAL THERAPY THORACOLUMBAR EVALUATION   Patient Name: Gwendolyn [name] MRN: [id number] DOB:[birth year], 57 y.o., female Today's Date: 06/01/2022  END OF SESSION:  PT End of Session - 06/01/22 1605     Visit Number 5    Number of Visits 12    Authorization Type Aet[employer]    Progress Note Due on Visit 10    PT Start Time 1603    PT Stop Time 1643    PT Time Calculation (min) 40 min    Activity Tolerance Patient tolerated treatment well    Behavior During Therapy WFL for tasks assessed/performed             Past Medical History:  Diagnosis Date   Diabetes mellitus without complication ([facility])    History of TIAs    Palpitations    Polycystic ovarian disease    Tobacco abuse    Past Surgical History:  Procedure Laterality Date   BREAST BIOPSY Left    TUBAL LIGATION     Patient Active Problem List   Diagnosis Date Noted   DDD (degenerative disc disease), lumbar 04/06/2022   Moderate episode of recurrent major depressive disorder 04/06/2022   Mixed hyperlipidemia 03/04/2022   Menopausal hot flushes 02/04/2022   IBS (irritable bowel syndrome) 08/10/2021   Asthma 02/19/2021   Type 2 diabetes mellitus 02/19/2021   Essential hypertension 02/19/2021   Hypothyroidism 10/21/2020   Tibial mass 05/17/2017   Lipoma of lower extremity 01/13/2017   Tobacco abuse    NIGHT SWEATS 04/21/2010    PCP: Anabel Halon, MD  REFERRING PROVIDER: Anabel Halon, MD  REFERRING DIAG: M51.36 (ICD-10-CM) - DDD (degenerative disc disease), lumbar  Rationale for Evaluation and Treatment: Rehabilitation  THERAPY DIAG:  Other low back pain  Muscle weakness (generalized)  ONSET DATE: one month  SUBJECTIVE:                                                                                                                                                                                           SUBJECTIVE STATEMENT:  Doing ok today. Back did ok over the week away at the beach.    Eval: Patient states low back and leg symptoms. Symptoms began about a month ago after a trip to the beach causing back and leg pain. Acute on chronic symptoms. Has to do a lot of walking at work. Has to carry a book bag which presses on back and it hurts holding bag especially with increased weight. Enjoys gardening and shelling. Legs ache with increased activity.  PERTINENT HISTORY:  HTN, DM, hypothyroidism, Hx TIA, anxiety, hx LBP  PAIN:  Are  you having pain? No and Yes: NPRS scale: 0/10 Pain location: Low back  Pain description: aching, sharp Aggravating factors: walking, sitting for too long  Relieving factors: sitting    PRECAUTIONS: None  WEIGHT BEARING RESTRICTIONS: No  FALLS:  Has patient fallen in last 6 months? No  OCCUPATION: Nurse, mental health at [employer]  PLOF: Independent  PATIENT GOALS: get back feeling better for vacations  NEXT MD VISIT: Next month  OBJECTIVE:   DIAGNOSTIC FINDINGS:  XR 04/06/22 IMPRESSION: Degenerative disc disease changes lower lumbar spine. No acute abnormalities.  PATIENT SURVEYS:  FOTO 44% function  SCREENING FOR RED FLAGS: Bowel or bladder incontinence: No Spinal tumors: No Cauda equina syndrome: No Compression fracture: No Abdominal aneurysm: No  COGNITION: Overall cognitive status: Within functional limits for tasks assessed     SENSATION: WFL   POSTURE: rounded shoulders, forward head, decreased lumbar lordosis, and decreased thoracic kyphosis  PALPATION: Grossly tender lumbar spine paraspinals, glute med/min, piriformis; hypomobile thoracic and lumbar spine; tender nodule/trigger point in L lower lumbar spine medial to psis on L   LUMBAR ROM:   AROM eval  Flexion 0% limited -  pain with returning to standing  Extension 25% limited *   Right lateral flexion 25% limited *   Left lateral flexion 25% limited *   Right rotation 0% limited  Left rotation 0% limited   (Blank rows = not tested)  LOWER  EXTREMITY ROM:   WFL for tasks assessed  Active  Right eval Left eval  Hip flexion    Hip extension    Hip abduction    Hip adduction    Hip internal rotation    Hip external rotation    Knee flexion    Knee extension    Ankle dorsiflexion    Ankle plantarflexion    Ankle inversion    Ankle eversion     (Blank rows = not tested)  LOWER EXTREMITY MMT:    MMT Right eval Left eval  Hip flexion 4+ 4  Hip extension 3+ 3+  Hip abduction 4 4   Hip adduction    Hip internal rotation    Hip external rotation    Knee flexion 5 4+*  Knee extension 5 5  Ankle dorsiflexion 5 5  Ankle plantarflexion    Ankle inversion    Ankle eversion     (Blank rows = not tested)  LUMBAR SPECIAL TESTS:  Femoral nerve tension test: muscles stretching, no change in symptoms Repeated prone press ups, limited ROM but no change in symptoms  GAIT: Distance walked: 400 feet Assistive device utilized: None Level of assistance: Complete Independence Comments: , L sided low back pain gradually increasing, slight sway back posture  TODAY'S TREATMENT:                                                                                                                              DATE:   06/01/22 Bridge 15  x 5"  Dead bug 2 x 10 SLR with ab brace x15 each    Quadruped hip extension x10 each  Quadruped birddog x10 each   Manual STM to LT lateral erectors, multifidus around L4 pre and post dry needling for trigger point identification and surface area preparation    Trigger Point Dry-Needling  Treatment instructions: Expect mild to moderate muscle soreness. S/S of pneumothorax if dry needled over a lung field, and to seek immediate medical attention should they occur. Patient verbalized understanding of these instructions and education.  Patient Consent Given: Yes Education handout provided: Yes Muscles treated: LT lateral erectors, multifidus around L4 Electrical stimulation performed:  No Parameters: N/A Treatment response/outcome: Notes trigger point pain during    05/24/2022 SKTC 10 x 5" LTR with green ball 10 x 5"  DKTC with green ball 10 x 5" each  Ab brace 10 x 5"  Ab march 2 x 10 SLR with ab brace 2 x10   Deadbug 2 x 10  Bridge 20x 5"    05/13/22 SKTC 10 x 5" LTR 10 x 5"  Ab brace 10 x 5"  Ab march 2 x 10 SLR with ab brace 2 x10   Bridge 10 x 5"   Sidelying hip abduction 2 x 10  Manual STM to LT lateral erectors, multifidus and lumbar paraspinal at L3-4 pre and post dry needling for trigger point identification and surface area preparation    Trigger Point Dry-Needling  Treatment instructions: Expect mild to moderate muscle soreness. S/S of pneumothorax if dry needled over a lung field, and to seek immediate medical attention should they occur. Patient verbalized understanding of these instructions and education.  Patient Consent Given: Yes Education handout provided: Yes Muscles treated: LT lateral erectors, multifidus and lumbar paraspinal at L3-4 Electrical stimulation performed: No Parameters: N/A Treatment response/outcome: Notes trigger point pain during    05/06/22 SKTC 10 x 5" LTR 10 x 5"  Bridge 5 x 5"    Manual STM to LT lateral erectors, multifidus around L4 pre and post dry needling for trigger point identification and surface area preparation    Trigger Point Dry-Needling  Treatment instructions: Expect mild to moderate muscle soreness. S/S of pneumothorax if dry needled over a lung field, and to seek immediate medical attention should they occur. Patient verbalized understanding of these instructions and education.  Patient Consent Given: Yes Education handout provided: Yes Muscles treated: LT lateral erectors, multifidus around L4 Electrical stimulation performed: No Parameters: N/A Treatment response/outcome: Notes trigger point pain during    04/27/22 Evaluation and education  Repeated press ups - no change in  symptoms   PATIENT EDUCATION:  Education details: Patient educated on exam findings, POC, scope of PT,  and posture, dry needling, spinal anatomy. Person educated: Patient Education method: Explanation, Demonstration, and Handouts Education comprehension: verbalized understanding, returned demonstration, verbal cues required, and tactile cues required  HOME EXERCISE PROGRAM: Access Code: [email or web address] URL: [email or web address] 06/01/22 - Beginner Front Arm Support  - 1-2 x daily - 7 x weekly - 2 sets - 10 reps - Bird Dog  - 1-2 x daily - 7 x weekly - 2 sets - 10 reps  May 24, 2022 - Dead Bug  - 2 x daily - 7 x weekly - 2 sets - 10 reps  05/13/22 - Supine Transversus Abdominis Bracing - Hands on Stomach  - 2 x daily - 7 x weekly - 1 sets - 10 reps - 5 second hold - Supine March  -  2 x daily - 7 x weekly - 2 sets - 10 reps - Supine Straight Leg Raises  - 2 x daily - 7 x weekly - 2 sets - 10 reps - Sidelying Hip Abduction  - 2 x daily - 7 x weekly - 2 sets - 10 reps  Date: 05/06/2022 Prepared by: Georges Lynch  Exercises - Hooklying Single Knee to Chest Stretch  - 2 x daily - 7 x weekly - 1 sets - 10 reps - 5 second hold - Supine Lower Trunk Rotation  - 2 x daily - 7 x weekly - 1 sets - 10 reps - 5 second hold - Supine Bridge  - 2 x daily - 7 x weekly - 1-2 sets - 10 reps - 5 second hold   ASSESSMENT:  CLINICAL IMPRESSION: Patient tolerated session well today. Progressed core strength with deadbug progression and quadruped series. Patient educated on purpose and function of all added exercises. Tolerated dry needing well. Ongoing fascial restrictions contributing to back pain. Patient will continue to benefit from skilled therapy services to reduce remaining deficits and improve functional ability.    OBJECTIVE IMPAIRMENTS: Abnormal gait, decreased activity tolerance, decreased balance, decreased endurance, decreased mobility, difficulty walking, decreased ROM, decreased  strength, increased muscle spasms, impaired flexibility, improper body mechanics, postural dysfunction, and pain.   ACTIVITY LIMITATIONS: carrying, lifting, bending, standing, squatting, stairs, transfers, reach over head, locomotion level, and caring for others  PARTICIPATION LIMITATIONS: meal prep, cleaning, laundry, shopping, community activity, and yard work  PERSONAL FACTORS: 3+ comorbidities: HTN, DM, hypothyroidism, Hx TIA, anxiety, hx LBP  are also affecting patient's functional outcome.   REHAB POTENTIAL: Good  CLINICAL DECISION MAKING: Stable/uncomplicated  EVALUATION COMPLEXITY: Low   GOALS: Goals reviewed with patient? Yes  SHORT TERM GOALS: Target date: 05/18/2022   Patient will be independent with HEP in order to improve functional outcomes. Baseline:  Goal status: INITIAL  2.  Patient will report at least 25% improvement in symptoms for improved quality of life. Baseline:  Goal status: INITIAL    LONG TERM GOALS: Target date: 06/08/2022   Patient will report at least 75% improvement in symptoms for improved quality of life. Baseline:  Goal status: INITIAL  2.  Patient will improve FOTO score by at least 18 points in order to indicate improved tolerance to activity. Baseline: 44% function Goal status: INITIAL  3.  Patient will demonstrate at least 25% improvement in lumbar ROM in all restricted planes for improved ability to move trunk while completing chores. Baseline: see above Goal status: INITIAL  4.  Patient will be able to ambulate at least 475 feet in in order to demonstrate improved tolerance to activity. Baseline: 400 feet Goal status: INITIAL  5.  Patient will demonstrate grade of 5/5 MMT grade in all tested musculature as evidence of improved strength to assist with stair ambulation and gait.   Baseline: see above Goal status: INITIAL     PLAN:  PT FREQUENCY: 1-2x/week  PT DURATION: 6 weeks  PLANNED INTERVENTIONS: Therapeutic  exercises, Therapeutic activity, Neuromuscular re-education, Balance training, Gait training, Patient/Family education, Joint manipulation, Joint mobilization, Stair training, Orthotic/Fit training, DME instructions, Aquatic Therapy, Dry Needling, Electrical stimulation, Spinal manipulation, Spinal mobilization, Cryotherapy, Moist heat, Compression bandaging, scar mobilization, Splintting, Taping, Traction, Ultrasound, Ionotophoresis 4mg /ml Dexamethasone, and Manual therapy  PLAN FOR NEXT SESSION: core and glute strength, postural strength   4:48 PM, 06/01/22  Georges Lynch PT DPT  Physical Therapist with [facility]  [facility]  [phone] 

## 2022-06-02 ENCOUNTER — Encounter: Payer: Self-pay | Admitting: Internal Medicine

## 2022-06-02 ENCOUNTER — Other Ambulatory Visit: Payer: Self-pay | Admitting: Internal Medicine

## 2022-06-02 ENCOUNTER — Other Ambulatory Visit (HOSPITAL_COMMUNITY): Payer: Self-pay

## 2022-06-02 DIAGNOSIS — E1169 Type 2 diabetes mellitus with other specified complication: Secondary | ICD-10-CM

## 2022-06-03 ENCOUNTER — Other Ambulatory Visit (HOSPITAL_COMMUNITY): Payer: Self-pay

## 2022-06-03 MED ORDER — MOUNJARO 5 MG/0.5ML ~~LOC~~ SOAJ
5.0000 mg | SUBCUTANEOUS | 1 refills | Status: DC
  Filled 2022-06-03: qty 2, 28d supply, fill #0

## 2022-06-04 ENCOUNTER — Other Ambulatory Visit (HOSPITAL_COMMUNITY): Payer: Self-pay

## 2022-06-07 ENCOUNTER — Ambulatory Visit: Payer: 59 | Admitting: Internal Medicine

## 2022-06-08 ENCOUNTER — Ambulatory Visit: Payer: 59 | Admitting: Internal Medicine

## 2022-06-08 ENCOUNTER — Other Ambulatory Visit: Payer: Self-pay

## 2022-06-08 ENCOUNTER — Encounter: Payer: Self-pay | Admitting: Internal Medicine

## 2022-06-08 ENCOUNTER — Other Ambulatory Visit (HOSPITAL_COMMUNITY): Payer: Self-pay

## 2022-06-08 VITALS — BP 131/82 | HR 75 | Ht 66.0 in | Wt 145.2 lb

## 2022-06-08 DIAGNOSIS — E1169 Type 2 diabetes mellitus with other specified complication: Secondary | ICD-10-CM

## 2022-06-08 DIAGNOSIS — Z720 Tobacco use: Secondary | ICD-10-CM

## 2022-06-08 DIAGNOSIS — E782 Mixed hyperlipidemia: Secondary | ICD-10-CM

## 2022-06-08 DIAGNOSIS — E039 Hypothyroidism, unspecified: Secondary | ICD-10-CM | POA: Diagnosis not present

## 2022-06-08 DIAGNOSIS — J4530 Mild persistent asthma, uncomplicated: Secondary | ICD-10-CM

## 2022-06-08 DIAGNOSIS — F331 Major depressive disorder, recurrent, moderate: Secondary | ICD-10-CM | POA: Diagnosis not present

## 2022-06-08 DIAGNOSIS — I10 Essential (primary) hypertension: Secondary | ICD-10-CM | POA: Diagnosis not present

## 2022-06-08 MED ORDER — ALBUTEROL SULFATE HFA 108 (90 BASE) MCG/ACT IN AERS: 2.0000 | INHALATION_SPRAY | Freq: Four times a day (QID) | RESPIRATORY_TRACT | 6 refills | Status: DC | PRN

## 2022-06-08 MED ORDER — FLUTICASONE-SALMETEROL 100-50 MCG/ACT IN AEPB: 1.0000 | INHALATION_SPRAY | Freq: Two times a day (BID) | RESPIRATORY_TRACT | 4 refills | Status: DC

## 2022-06-08 NOTE — Progress Notes (Signed)
Established Patient Office Visit  Subjective:  Patient ID: Gwendolyn [name], female    DOB: 1965/10/06  Age: 57 y.o. MRN: [id number]  CC:  Chief Complaint  Patient presents with   Diabetes    Three month follow up for diabetes and hypothyroidism     HPI Gwendolyn [name] is a 57 y.o. female with past medical history of HTN, asthma, type II DM, hypothyroidism and tobacco abuse who presents for f/u of her chronic medical conditions.  Type II DM: She currently takes metformin and has started taking Mounjaro, but has trouble getting it now due to short supply.  She denies any nausea or vomiting with Mounjaro now. Her HbA1c had improved to 6.1. She denies any polyuria or polyphagia currently.  Hypothyroidism: She was having jitteriness, tremors and palpitations, which have slightly improved since decreasing dose of levothyroxine to 75 mcg.  Her thyroid function testing shows oversupplementation.  She has started taking levothyroxine 75 mcg daily now, and has started noticing improvement in her tremors.  Denies any recent change in weight or appetite.  Asthma: She uses Advair currently, but it is not on her insurance formulary now.  She uses albuterol as needed for dyspnea or wheezing.   Past Medical History:  Diagnosis Date   Diabetes mellitus without complication    History of TIAs    Palpitations    Polycystic ovarian disease    Tobacco abuse     Past Surgical History:  Procedure Laterality Date   BREAST BIOPSY Left    TUBAL LIGATION      Family History  Problem Relation Age of Onset   Hypertension Mother    Diabetes Father    Breast cancer Maternal Grandmother    Diabetes Brother     Social History   Socioeconomic History   Marital status: Married    Spouse name: Not on file   Number of children: Not on file   Years of education: Not on file   Highest education level: Not on file  Occupational History   Not on file  Tobacco Use   Smoking status: Every Day     Packs/day: 0.25    Years: 20.00    Additional pack years: 0.00    Total pack years: 5.00    Types: Cigarettes   Smokeless tobacco: Never  Vaping Use   Vaping Use: Former  Substance and Sexual Activity   Alcohol use: Yes    Alcohol/week: 14.0 standard drinks of alcohol    Types: 14 Glasses of wine per week    Comment: 2-3 times per week   Drug use: No   Sexual activity: Not Currently    Birth control/protection: Post-menopausal  Other Topics Concern   Not on file  Social History Narrative   Exercises rarely. Smokes tobacco. Works in a [employer]. Lives in [place]. Eats meat fruit and vegetables. Married for 18 years,lives with husband.   Social Determinants of Health   Financial Resource Strain: Not on file  Food Insecurity: Not on file  Transportation Needs: Not on file  Physical Activity: Not on file  Stress: Not on file  Social Connections: Not on file  Intimate Partner Violence: Not on file    Outpatient Medications Prior to Visit  Medication Sig Dispense Refill   ondansetron (ZOFRAN) 4 MG tablet Take 1 tablet (4 mg total) by mouth every 8 (eight) hours as needed for nausea or vomiting. 20 tablet 0   Albuterol Sulfate, sensor, (PROAIR  DIGIHALER) 108 (90 Base) MCG/ACT AEPB Inhale 2 puffs every 4 hours by inhalation route.     diazepam (VALIUM) 5 MG tablet TAKE 1 TABLET BY MOUTH DAILY AS NEEDED FOR SEVERE ANXIETY/ PANIC     EPINEPHrine (EPIPEN 2-PAK) 0.3 mg/0.3 mL IJ SOAJ injection Inject 0.3 mg into the muscle as needed for anaphylaxis. 1 each 1   escitalopram (LEXAPRO) 20 MG tablet Take 20 mg by mouth daily.     levothyroxine (SYNTHROID) 75 MCG tablet Take 1 tablet (75 mcg total) by mouth daily. 30 tablet 3   metFORMIN (GLUCOPHAGE-XR) 500 MG 24 hr tablet Take 1 tablet (500 mg total) by mouth daily with breakfast. 90 tablet 1   nitroGLYCERIN (NITROSTAT) 0.4 MG SL tablet Place 1 tablet (0.4 mg total) under the tongue  every 5 (five) minutes as needed. 25 tablet 3   Plecanatide (TRULANCE) 3 MG TABS Take 1 each by mouth daily. 90 tablet 1   polyethylene glycol-electrolytes (NULYTELY) 420 g solution Take 4,000 mLs by mouth once.     telmisartan (MICARDIS) 20 MG tablet TAKE 1 TABLET BY MOUTH EVERY DAY 90 tablet 1   tirzepatide (MOUNJARO) 5 MG/0.5ML Pen INJECT 5 MG SUBCUTANEOUSLY WEEKLY 6 mL 1   ADVAIR HFA 45-21 MCG/ACT inhaler INHALE 2 PUFFS INTO THE LUNGS TWICE A DAY 24 each 5   albuterol (PROAIR HFA) 108 (90 Base) MCG/ACT inhaler Inhale 2 puffs into the lungs every 6 (six) hours as needed. 18 g 6   predniSONE (DELTASONE) 20 MG tablet Take 1 tablet (20 mg total) by mouth daily with breakfast. 5 tablet 0   tirzepatide (MOUNJARO) 5 MG/0.5ML Pen Inject 5 mg into the skin once a week. 6 mL 1   No facility-administered medications prior to visit.    Allergies  Allergen Reactions   Lidocaine Anaphylaxis, Other (See Comments) and Nausea And Vomiting   Other Hives and Rash    SEA MOSS    ROS Review of Systems  Constitutional:  Positive for fatigue. Negative for chills and fever.  HENT:  Negative for congestion, postnasal drip, sinus pressure, sinus pain and sore throat.   Eyes:  Negative for pain and discharge.  Respiratory:  Negative for cough and shortness of breath.   Cardiovascular:  Negative for chest pain and palpitations.  Gastrointestinal:  Negative for abdominal pain, diarrhea, nausea and vomiting.  Endocrine: Positive for heat intolerance. Negative for polydipsia and polyuria.  Genitourinary:  Negative for dysuria and hematuria.  Musculoskeletal:  Negative for neck pain and neck stiffness.  Skin:  Negative for rash.  Neurological:  Positive for tremors. Negative for dizziness and weakness.  Psychiatric/Behavioral:  Negative for agitation and behavioral problems. The patient is nervous/anxious.       Objective:    Physical Exam Vitals reviewed.  Constitutional:      General: She is not in  acute distress.    Appearance: She is not diaphoretic.  HENT:     Head: Normocephalic and atraumatic.     Nose: No congestion.     Mouth/Throat:     Mouth: Mucous membranes are moist.  Eyes:     General: No scleral icterus.    Extraocular Movements: Extraocular movements intact.  Cardiovascular:     Rate and Rhythm: Normal rate and regular rhythm.     Pulses: Normal pulses.     Heart sounds: Normal heart sounds. No murmur heard. Pulmonary:     Breath sounds: Normal breath sounds. No wheezing or rales.  Musculoskeletal:  Cervical back: Neck supple. No tenderness.     Right lower leg: No edema.     Left lower leg: No edema.  Skin:    General: Skin is warm.     Findings: No rash.  Neurological:     General: No focal deficit present.     Mental Status: She is alert and oriented to person, place, and time.     Sensory: No sensory deficit.     Motor: Tremor (L > R hand) present. No weakness.  Psychiatric:        Mood and Affect: Mood normal.        Behavior: Behavior normal.     BP 131/82 (BP Location: Left Arm, Patient Position: Sitting, Cuff Size: Normal)   Pulse 75   Ht  (1.676 m)   Wt 145 lb 3.2 oz (65.9 kg)   LMP 03/30/2011   SpO2 91%   BMI 23.44 kg/m  Wt Readings from Last 3 Encounters:  06/08/22 145 lb 3.2 oz (65.9 kg)  04/06/22 152 lb 3.2 oz (69 kg)  03/04/22 154 lb 9.6 oz (70.1 kg)    Lab Results  Component Value Date   TSH 0.071 (L) 02/23/2022   Lab Results  Component Value Date   WBC 6.5 02/23/2022   HGB 15.6 02/23/2022   HCT 45.5 02/23/2022   MCV 97 02/23/2022   PLT 263 02/23/2022   Lab Results  Component Value Date   NA 136 02/23/2022   K 5.0 02/23/2022   CO2 21 02/23/2022   GLUCOSE 125 (H) 02/23/2022   BUN 10 02/23/2022   CREATININE 0.68 02/23/2022   BILITOT 0.3 02/23/2022   ALKPHOS 60 02/23/2022   AST 19 02/23/2022   ALT 26 02/23/2022   PROT 7.1 02/23/2022   ALBUMIN 4.7 02/23/2022   CALCIUM 9.9 02/23/2022   EGFR 103  02/23/2022   GFR 105.90 04/22/2010   Lab Results  Component Value Date   CHOL 165 09/03/2019   Lab Results  Component Value Date   HDL 67 09/03/2019   Lab Results  Component Value Date   LDLCALC 79 09/03/2019   Lab Results  Component Value Date   TRIG 107 09/03/2019   Lab Results  Component Value Date   CHOLHDL 2.5 09/03/2019   Lab Results  Component Value Date   HGBA1C 6.1 (H) 02/23/2022      Assessment & Plan:   Problem List Items Addressed This Visit       Cardiovascular and Mediastinum   Essential hypertension    BP Readings from Last 1 Encounters:  06/08/22 131/82  Well-controlled with Telmisartan 20 mg QD Counseled for compliance with the medications Advised DASH diet and moderate exercise/walking, at least 150 mins/week        Respiratory   Asthma    Well-controlled with Advair and PRN Albuterol Switched to [employer] due to insurance formulary      Relevant Medications   fluticasone-salmeterol (WIXELA INHUB) 100-50 MCG/ACT AEPB   albuterol (PROAIR HFA) 108 (90 Base) MCG/ACT inhaler     Endocrine   Hypothyroidism    Lab Results  Component Value Date   TSH 0.071 (L) 02/23/2022  Decreased dose of levothyroxine to 75 mcg daily Symptoms slowly improving Was on NP thyroid 90 mg QD Chart review suggests normal TSH in the past as well Check TSH and free T4      Type 2 diabetes mellitus - Primary    Lab Results  Component Value Date   HGBA1C  6.1 (H) 02/23/2022  Associated with HTN and HLD On Metformin and Mounjaro She had persistent nausea with Trulicity, Rybelsus - switched to [facility], tolerating it better, has had short supply issue, but prefers to keep the same and will keep checking at pharmacy  Advised to take Metformin 500 mg BID until she gets Mounjaro Advised to follow diabetic diet On ARB now F/u CMP and lipid panel later Diabetic eye exam: Advised to follow up with Ophthalmology for diabetic eye exam      Relevant Orders    Hemoglobin A1c     Other   Tobacco abuse    Smokes about 0.20 pack/day  Asked about quitting: confirms that he/she currently smokes cigarettes Advise to quit smoking: Educated about QUITTING to reduce the risk of cancer, cardio and cerebrovascular disease. Assess willingness: Unwilling to quit at this time, but is working on cutting back. Assist with counseling and pharmacotherapy: Counseled for 5 minutes and literature provided. Arrange for follow up: follow up in 3 months and continue to offer help.      Mixed hyperlipidemia    Discussed about statin Check lipid profile      Moderate episode of recurrent major depressive disorder    Uncontrolled Currently has uncontrolled thyroid profile, which can also contribute to anxiety/depression She is on Lexapro and Valium currently, followed by psychiatry      Meds ordered this encounter  Medications   fluticasone-salmeterol (WIXELA INHUB) 100-50 MCG/ACT AEPB    Sig: Inhale 1 puff into the lungs 2 (two) times daily.    Dispense:  60 each    Refill:  4   albuterol (PROAIR HFA) 108 (90 Base) MCG/ACT inhaler    Sig: Inhale 2 puffs into the lungs every 6 (six) hours as needed.    Dispense:  18 g    Refill:  6    Follow-up: Return in about 4 months (around 10/08/2022) for DM and hypothyroidism.    Anabel Halon, MD

## 2022-06-08 NOTE — Assessment & Plan Note (Signed)
Lab Results  Component Value Date   TSH 0.071 (L) 02/23/2022   Decreased dose of levothyroxine to 75 mcg daily Symptoms slowly improving Was on NP thyroid 90 mg QD Chart review suggests normal TSH in the past as well Check TSH and free T4

## 2022-06-08 NOTE — Assessment & Plan Note (Addendum)
Lab Results  Component Value Date   HGBA1C 6.1 (H) 02/23/2022   Associated with HTN and HLD On Metformin and Mounjaro She had persistent nausea with Trulicity, Rybelsus - switched to [facility], tolerating it better, has had short supply issue, but prefers to keep the same and will keep checking at pharmacy  Advised to take Metformin 500 mg BID until she gets Mounjaro Advised to follow diabetic diet On ARB now F/u CMP and lipid panel later Diabetic eye exam: Advised to follow up with Ophthalmology for diabetic eye exam

## 2022-06-08 NOTE — Patient Instructions (Signed)
Please start using Wixela instead of Advair.  Please continue taking medications as prescribed.  Please continue to follow low carb diet and perform moderate exercise/walking at least 150 mins/week.

## 2022-06-08 NOTE — Assessment & Plan Note (Signed)
BP Readings from Last 1 Encounters:  06/08/22 131/82   Well-controlled with Telmisartan 20 mg QD Counseled for compliance with the medications Advised DASH diet and moderate exercise/walking, at least 150 mins/week

## 2022-06-08 NOTE — Assessment & Plan Note (Addendum)
Smokes about 0.20 pack/day  Asked about quitting: confirms that he/she currently smokes cigarettes Advise to quit smoking: Educated about QUITTING to reduce the risk of cancer, cardio and cerebrovascular disease. Assess willingness: Unwilling to quit at this time, but is working on cutting back. Assist with counseling and pharmacotherapy: Counseled for 5 minutes and literature provided. Arrange for follow up: follow up in 3 months and continue to offer help. 

## 2022-06-08 NOTE — Assessment & Plan Note (Signed)
Well-controlled with Advair and PRN Albuterol Switched to [employer] due to insurance formulary

## 2022-06-08 NOTE — Assessment & Plan Note (Signed)
Uncontrolled Currently has uncontrolled thyroid profile, which can also contribute to anxiety/depression She is on Lexapro and Valium currently, followed by psychiatry 

## 2022-06-08 NOTE — Assessment & Plan Note (Signed)
Discussed about statin Check lipid profile 

## 2022-06-09 ENCOUNTER — Ambulatory Visit (HOSPITAL_COMMUNITY): Payer: 59 | Admitting: Physical Therapy

## 2022-06-09 ENCOUNTER — Encounter (HOSPITAL_COMMUNITY): Payer: Self-pay | Admitting: Physical Therapy

## 2022-06-09 ENCOUNTER — Other Ambulatory Visit: Payer: Self-pay | Admitting: Internal Medicine

## 2022-06-09 DIAGNOSIS — E782 Mixed hyperlipidemia: Secondary | ICD-10-CM

## 2022-06-09 DIAGNOSIS — R29898 Other symptoms and signs involving the musculoskeletal system: Secondary | ICD-10-CM

## 2022-06-09 DIAGNOSIS — E039 Hypothyroidism, unspecified: Secondary | ICD-10-CM

## 2022-06-09 DIAGNOSIS — R2689 Other abnormalities of gait and mobility: Secondary | ICD-10-CM

## 2022-06-09 DIAGNOSIS — M6281 Muscle weakness (generalized): Secondary | ICD-10-CM

## 2022-06-09 DIAGNOSIS — E1169 Type 2 diabetes mellitus with other specified complication: Secondary | ICD-10-CM

## 2022-06-09 DIAGNOSIS — M5459 Other low back pain: Secondary | ICD-10-CM | POA: Diagnosis not present

## 2022-06-09 LAB — HEMOGLOBIN A1C
Est. average glucose Bld gHb Est-mCnc: 137 mg/dL
Hgb A1c MFr Bld: 6.4 % — ABNORMAL HIGH (ref 4.8–5.6)

## 2022-06-09 MED ORDER — LEVOTHYROXINE SODIUM 50 MCG PO TABS: 50.0000 ug | ORAL_TABLET | Freq: Every day | ORAL | 3 refills | Status: DC

## 2022-06-09 NOTE — Therapy (Signed)
OUTPATIENT PHYSICAL THERAPY THORACOLUMBAR EVALUATION   Patient Name: Gwendolyn [name] MRN: [id number] DOB:12-26-1965, 57 y.o., female Today's Date: 06/09/2022 PHYSICAL THERAPY DISCHARGE SUMMARY  Visits from Start of Care: 6  Current functional level related to goals / functional outcomes: See below   Remaining deficits: See below   Education / Equipment: See below   Patient agrees to discharge. Patient goals were met. Patient is being discharged due to being pleased with the current functional level.  Progress Note   Reporting Period 04/27/22 to 06/09/22   See note below for Objective Data and Assessment of Progress/Goals   END OF SESSION:  PT End of Session - 06/09/22 1433     Visit Number 6    Number of Visits 12    Date for PT Re-Evaluation 06/08/22    Authorization Type [employer]    Progress Note Due on Visit 10    PT Start Time 1432    PT Stop Time 1500    PT Time Calculation (min) 28 min    Activity Tolerance Patient tolerated treatment well    Behavior During Therapy WFL for tasks assessed/performed             Past Medical History:  Diagnosis Date   Diabetes mellitus without complication    History of TIAs    Palpitations    Polycystic ovarian disease    Tobacco abuse    Past Surgical History:  Procedure Laterality Date   BREAST BIOPSY Left    TUBAL LIGATION     Patient Active Problem List   Diagnosis Date Noted   DDD (degenerative disc disease), lumbar 04/06/2022   Moderate episode of recurrent major depressive disorder 04/06/2022   Mixed hyperlipidemia 03/04/2022   Menopausal hot flushes 02/04/2022   IBS (irritable bowel syndrome) 08/10/2021   Asthma 02/19/2021   Type 2 diabetes mellitus 02/19/2021   Essential hypertension 02/19/2021   Hypothyroidism 10/21/2020   Tibial mass 05/17/2017   Lipoma of lower extremity 01/13/2017   Tobacco abuse    NIGHT SWEATS 04/21/2010    PCP: Anabel Halon, MD  REFERRING PROVIDER: Anabel Halon,  MD  REFERRING DIAG: M51.36 (ICD-10-CM) - DDD (degenerative disc disease), lumbar  Rationale for Evaluation and Treatment: Rehabilitation  THERAPY DIAG:  Other low back pain  Muscle weakness (generalized)  Other abnormalities of gait and mobility  Other symptoms and signs involving the musculoskeletal system  ONSET DATE: one month  SUBJECTIVE:  SUBJECTIVE STATEMENT:  Patient states occasional back symptoms with walking. HEP going well. Patient states 90% improvement in symptoms since beginning PT.   Eval: Patient states low back and leg symptoms. Symptoms began about a month ago after a trip to the beach causing back and leg pain. Acute on chronic symptoms. Has to do a lot of walking at work. Has to carry a book bag which presses on back and it hurts holding bag especially with increased weight. Enjoys gardening and shelling. Legs ache with increased activity.  PERTINENT HISTORY:  HTN, DM, hypothyroidism, Hx TIA, anxiety, hx LBP  PAIN:  Are you having pain? No and Yes: NPRS scale: 0/10 Pain location: Low back  Pain description: aching, sharp Aggravating factors: walking, sitting for too long  Relieving factors: sitting    PRECAUTIONS: None  WEIGHT BEARING RESTRICTIONS: No  FALLS:  Has patient fallen in last 6 months? No  OCCUPATION: Nurse, mental health at [employer]  PLOF: Independent  PATIENT GOALS: get back feeling better for vacations  NEXT MD VISIT: Next month  OBJECTIVE:   DIAGNOSTIC FINDINGS:  XR 04/06/22 IMPRESSION: Degenerative disc disease changes lower lumbar spine. No acute abnormalities.  PATIENT SURVEYS:  FOTO 44% function  06/09/22 63% function  SCREENING FOR RED FLAGS: Bowel or bladder incontinence: No Spinal tumors: No Cauda equina syndrome: No Compression  fracture: No Abdominal aneurysm: No  COGNITION: Overall cognitive status: Within functional limits for tasks assessed     SENSATION: WFL   POSTURE: rounded shoulders, forward head, decreased lumbar lordosis, and decreased thoracic kyphosis  PALPATION: Grossly tender lumbar spine paraspinals, glute med/min, piriformis; hypomobile thoracic and lumbar spine; tender nodule/trigger point in L lower lumbar spine medial to psis on L   LUMBAR ROM:   AROM eval 06/09/22  Flexion 0% limited -  pain with returning to standing 0% limited  Extension 25% limited *  0% limited  Right lateral flexion 25% limited *  0% limited  Left lateral flexion 25% limited *  0% limited  Right rotation 0% limited 0% limited  Left rotation 0% limited 0% limited   (Blank rows = not tested)  LOWER EXTREMITY ROM:   WFL for tasks assessed  Active  Right eval Left eval  Hip flexion    Hip extension    Hip abduction    Hip adduction    Hip internal rotation    Hip external rotation    Knee flexion    Knee extension    Ankle dorsiflexion    Ankle plantarflexion    Ankle inversion    Ankle eversion     (Blank rows = not tested)  LOWER EXTREMITY MMT:    MMT Right eval Left eval Right 06/09/22 Left 06/09/22  Hip flexion 4+ 4 5 5   Hip extension 3+ 3+ 4- 4-  Hip abduction 4 4  4  4+  Hip adduction      Hip internal rotation      Hip external rotation      Knee flexion 5 4+* 5 5  Knee extension 5 5 5 5   Ankle dorsiflexion 5 5 5 5   Ankle plantarflexion      Ankle inversion      Ankle eversion       (Blank rows = not tested)  LUMBAR SPECIAL TESTS:  Femoral nerve tension test: muscles stretching, no change in symptoms Repeated prone press ups, limited ROM but no change in symptoms  GAIT: Distance walked: 400 feet Assistive device utilized: None Level  of assistance: Complete Independence Comments: , L sided low back pain gradually increasing, slight sway back posture  Reassessment 06/09/22  GAIT: Distance walked: 480 feet Assistive device utilized: None Level of assistance: Complete Independence Comments: , slight sway back posture  TODAY'S TREATMENT:                                                                                                                              DATE:  06/09/22 Reassessment Review of HEP   2022-06-28 Bridge 15 x 5"  Dead bug 2 x 10 SLR with ab brace x15 each    Quadruped hip extension x10 each  Quadruped birddog x10 each   Manual STM to LT lateral erectors, multifidus around L4 pre and post dry needling for trigger point identification and surface area preparation    Trigger Point Dry-Needling  Treatment instructions: Expect mild to moderate muscle soreness. S/S of pneumothorax if dry needled over a lung field, and to seek immediate medical attention should they occur. Patient verbalized understanding of these instructions and education.  Patient Consent Given: Yes Education handout provided: Yes Muscles treated: LT lateral erectors, multifidus around L4 Electrical stimulation performed: No Parameters: N/A Treatment response/outcome: Notes trigger point pain during    05/18/22 SKTC 10 x 5" LTR with green ball 10 x 5"  DKTC with green ball 10 x 5" each  Ab brace 10 x 5"  Ab march 2 x 10 SLR with ab brace 2 x10   Deadbug 2 x 10  Bridge 20x 5"    05/13/22 SKTC 10 x 5" LTR 10 x 5"  Ab brace 10 x 5"  Ab march 2 x 10 SLR with ab brace 2 x10   Bridge 10 x 5"   Sidelying hip abduction 2 x 10  Manual STM to LT lateral erectors, multifidus and lumbar paraspinal at L3-4 pre and post dry needling for trigger point identification and surface area preparation    Trigger Point Dry-Needling  Treatment instructions: Expect mild to moderate muscle soreness. S/S of pneumothorax if dry needled over a lung field, and to seek immediate medical attention should they occur. Patient verbalized understanding of these instructions and  education.  Patient Consent Given: Yes Education handout provided: Yes Muscles treated: LT lateral erectors, multifidus and lumbar paraspinal at L3-4 Electrical stimulation performed: No Parameters: N/A Treatment response/outcome: Notes trigger point pain during    05/06/22 SKTC 10 x 5" LTR 10 x 5"  Bridge 5 x 5"    Manual STM to LT lateral erectors, multifidus around L4 pre and post dry needling for trigger point identification and surface area preparation    Trigger Point Dry-Needling  Treatment instructions: Expect mild to moderate muscle soreness. S/S of pneumothorax if dry needled over a lung field, and to seek immediate medical attention should they occur. Patient verbalized understanding of these instructions and education.  Patient Consent Given: Yes Education handout provided: Yes Muscles treated: LT lateral erectors,  multifidus around L4 Electrical stimulation performed: No Parameters: N/A Treatment response/outcome: Notes trigger point pain during    04/27/22 Evaluation and education  Repeated press ups - no change in symptoms   PATIENT EDUCATION:  Education details: Patient educated on exam findings, POC, scope of PT,  and posture, dry needling, spinal anatomy. Person educated: Patient Education method: Explanation, Demonstration, and Handouts Education comprehension: verbalized understanding, returned demonstration, verbal cues required, and tactile cues required  HOME EXERCISE PROGRAM: Access Code: [email or web address] URL: [email or web address] 06/01/22 - Beginner Front Arm Support  - 1-2 x daily - 7 x weekly - 2 sets - 10 reps - Bird Dog  - 1-2 x daily - 7 x weekly - 2 sets - 10 reps  June 03, 2022 - Dead Bug  - 2 x daily - 7 x weekly - 2 sets - 10 reps  05/13/22 - Supine Transversus Abdominis Bracing - Hands on Stomach  - 2 x daily - 7 x weekly - 1 sets - 10 reps - 5 second hold - Supine March  - 2 x daily - 7 x weekly - 2 sets - 10 reps - Supine Straight  Leg Raises  - 2 x daily - 7 x weekly - 2 sets - 10 reps - Sidelying Hip Abduction  - 2 x daily - 7 x weekly - 2 sets - 10 reps  Date: 05/06/2022 Prepared by: Georges Lynch  Exercises - Hooklying Single Knee to Chest Stretch  - 2 x daily - 7 x weekly - 1 sets - 10 reps - 5 second hold - Supine Lower Trunk Rotation  - 2 x daily - 7 x weekly - 1 sets - 10 reps - 5 second hold - Supine Bridge  - 2 x daily - 7 x weekly - 1-2 sets - 10 reps - 5 second hold   ASSESSMENT:  CLINICAL IMPRESSION: Patient has met 2/2 short term goals and 4/5 long term goals with ability to complete HEP and improvement in symptoms, strength, ROM, activity tolerance, gait, and functional mobility. Remaining goals not met due to continued deficits in glute strength although it has improved. Patient has made good progress toward remaining goals. Patient ready to transition to HEP for self management and agreeable to return to PT if needed. Patient discharged from PT at this time.     OBJECTIVE IMPAIRMENTS: Abnormal gait, decreased activity tolerance, decreased balance, decreased endurance, decreased mobility, difficulty walking, decreased ROM, decreased strength, increased muscle spasms, impaired flexibility, improper body mechanics, postural dysfunction, and pain.   ACTIVITY LIMITATIONS: carrying, lifting, bending, standing, squatting, stairs, transfers, reach over head, locomotion level, and caring for others  PARTICIPATION LIMITATIONS: meal prep, cleaning, laundry, shopping, community activity, and yard work  PERSONAL FACTORS: 3+ comorbidities: HTN, DM, hypothyroidism, Hx TIA, anxiety, hx LBP  are also affecting patient's functional outcome.   REHAB POTENTIAL: Good  CLINICAL DECISION MAKING: Stable/uncomplicated  EVALUATION COMPLEXITY: Low   GOALS: Goals reviewed with patient? Yes  SHORT TERM GOALS: Target date: 06/03/22   Patient will be independent with HEP in order to improve functional  outcomes. Baseline:  Goal status: MET  2.  Patient will report at least 25% improvement in symptoms for improved quality of life. Baseline:  Goal status: MET    LONG TERM GOALS: Target date: 06/08/2022   Patient will report at least 75% improvement in symptoms for improved quality of life. Baseline:  Goal status: MET  2.  Patient will improve FOTO score by at least 18 points in order  to indicate improved tolerance to activity. Baseline: 44% function 06/09/22 63% function Goal status: MET  3.  Patient will demonstrate at least 25% improvement in lumbar ROM in all restricted planes for improved ability to move trunk while completing chores. Baseline: see above Goal status: MET  4.  Patient will be able to ambulate at least 475 feet in in order to demonstrate improved tolerance to activity. Baseline: 400 feet 06/09/22 480 feet Goal status: MET  5.  Patient will demonstrate grade of 5/5 MMT grade in all tested musculature as evidence of improved strength to assist with stair ambulation and gait.   Baseline: see above Goal status: NOT MET     PLAN:  PT FREQUENCY: 1-2x/week  PT DURATION: 6 weeks  PLANNED INTERVENTIONS: Therapeutic exercises, Therapeutic activity, Neuromuscular re-education, Balance training, Gait training, Patient/Family education, Joint manipulation, Joint mobilization, Stair training, Orthotic/Fit training, DME instructions, Aquatic Therapy, Dry Needling, Electrical stimulation, Spinal manipulation, Spinal mobilization, Cryotherapy, Moist heat, Compression bandaging, scar mobilization, Splintting, Taping, Traction, Ultrasound, Ionotophoresis 4mg /ml Dexamethasone, and Manual therapy  PLAN FOR NEXT SESSION: n/a  3:08 PM, 06/09/22 Wyman Songster PT, DPT Physical Therapist at [facility]

## 2022-06-15 LAB — HM DIABETES EYE EXAM

## 2022-06-21 ENCOUNTER — Encounter (HOSPITAL_COMMUNITY): Payer: 59 | Admitting: Physical Therapy

## 2022-06-28 ENCOUNTER — Encounter (HOSPITAL_COMMUNITY): Payer: 59 | Admitting: Physical Therapy

## 2022-07-13 ENCOUNTER — Other Ambulatory Visit: Payer: Self-pay | Admitting: Internal Medicine

## 2022-07-13 DIAGNOSIS — E039 Hypothyroidism, unspecified: Secondary | ICD-10-CM

## 2022-08-06 ENCOUNTER — Other Ambulatory Visit: Payer: Self-pay | Admitting: Internal Medicine

## 2022-08-06 DIAGNOSIS — J4530 Mild persistent asthma, uncomplicated: Secondary | ICD-10-CM

## 2022-08-25 DIAGNOSIS — F411 Generalized anxiety disorder: Secondary | ICD-10-CM | POA: Diagnosis not present

## 2022-08-25 DIAGNOSIS — F410 Panic disorder [episodic paroxysmal anxiety] without agoraphobia: Secondary | ICD-10-CM | POA: Diagnosis not present

## 2022-09-18 ENCOUNTER — Other Ambulatory Visit: Payer: Self-pay | Admitting: Internal Medicine

## 2022-09-18 DIAGNOSIS — I10 Essential (primary) hypertension: Secondary | ICD-10-CM

## 2022-10-07 ENCOUNTER — Encounter: Payer: Self-pay | Admitting: Internal Medicine

## 2022-10-07 ENCOUNTER — Ambulatory Visit: Payer: 59 | Admitting: Internal Medicine

## 2022-10-07 VITALS — BP 115/76 | HR 79 | Ht 66.0 in | Wt 136.0 lb

## 2022-10-07 DIAGNOSIS — F331 Major depressive disorder, recurrent, moderate: Secondary | ICD-10-CM

## 2022-10-07 DIAGNOSIS — Z7984 Long term (current) use of oral hypoglycemic drugs: Secondary | ICD-10-CM

## 2022-10-07 DIAGNOSIS — J4530 Mild persistent asthma, uncomplicated: Secondary | ICD-10-CM

## 2022-10-07 DIAGNOSIS — E782 Mixed hyperlipidemia: Secondary | ICD-10-CM

## 2022-10-07 DIAGNOSIS — I10 Essential (primary) hypertension: Secondary | ICD-10-CM | POA: Diagnosis not present

## 2022-10-07 DIAGNOSIS — E039 Hypothyroidism, unspecified: Secondary | ICD-10-CM

## 2022-10-07 DIAGNOSIS — E1169 Type 2 diabetes mellitus with other specified complication: Secondary | ICD-10-CM

## 2022-10-07 MED ORDER — AIRSUPRA 90-80 MCG/ACT IN AERO
2.0000 | INHALATION_SPRAY | RESPIRATORY_TRACT | 2 refills | Status: AC | PRN
Start: 2022-10-07 — End: ?

## 2022-10-07 NOTE — Assessment & Plan Note (Addendum)
Well-controlled with Wixela and PRN Albuterol Switched to Airsupra instead of albuterol for optimal response

## 2022-10-07 NOTE — Assessment & Plan Note (Signed)
Lab Results  Component Value Date   HGBA1C 6.4 (H) 06/08/2022   Associated with HTN and HLD On Metformin and Mounjaro She had persistent nausea with Trulicity, Rybelsus - switched to [facility], tolerating it better  Advised to follow diabetic diet On ARB now F/u CMP and lipid panel later Diabetic eye exam: Advised to follow up with Ophthalmology for diabetic eye exam

## 2022-10-07 NOTE — Assessment & Plan Note (Addendum)
Discussed about statin in the last visit Check lipid profile

## 2022-10-07 NOTE — Assessment & Plan Note (Addendum)
Lab Results  Component Value Date   TSH 0.071 (L) 02/23/2022   Decreased dose of levothyroxine to 50 mcg daily Symptoms  improved now Was on NP thyroid 90 mg QD Chart review suggests normal TSH in the past as well Check TSH and free T4

## 2022-10-07 NOTE — Patient Instructions (Signed)
Please continue to take medications as prescribed. ? ?Please continue to follow low carb diet and perform moderate exercise/walking at least 150 mins/week. ?

## 2022-10-07 NOTE — Assessment & Plan Note (Signed)
BP Readings from Last 1 Encounters:  10/07/22 115/76   Well-controlled with Telmisartan 20 mg QD Counseled for compliance with the medications Advised DASH diet and moderate exercise/walking, at least 150 mins/week

## 2022-10-07 NOTE — Assessment & Plan Note (Addendum)
Well-controlled She is on Lexapro and Valium currently, followed by psychiatry

## 2022-10-07 NOTE — Progress Notes (Signed)
Established Patient Office Visit  Subjective:  Patient ID: Gwendolyn [name], female    DOB: [birth year]  Age: 57 y.o. MRN: [id number]  CC:  Chief Complaint  Patient presents with   Diabetes    Follow up     HPI Gwendolyn [name] is a 57 y.o. female with past medical history of HTN, asthma, type II DM, hypothyroidism and tobacco abuse who presents for f/u of her chronic medical conditions.  Type II DM: She currently takes metformin and has been taking Mounjaro 5 mg QW.  She denies any nausea or vomiting with Mounjaro now. Her HbA1c had improved to 6.1. She denies any polyuria or polyphagia currently.  Hypothyroidism: She was having jitteriness, tremors and palpitations, which have improved since decreasing dose of levothyroxine to 50 mcg.  Her thyroid function testing showed oversupplementation in 01/24, but needs updated labs since decreasing dose of levothyroxine. Denies any recent change in weight or appetite.  Asthma: She uses Wixela currently.  She uses albuterol as needed for dyspnea or wheezing, but has run out of it.   Past Medical History:  Diagnosis Date   Diabetes mellitus without complication (HCC)    History of TIAs    Palpitations    Polycystic ovarian disease    Tobacco abuse     Past Surgical History:  Procedure Laterality Date   BREAST BIOPSY Left    TUBAL LIGATION      Family History  Problem Relation Age of Onset   Hypertension Mother    Diabetes Father    Breast cancer Maternal Grandmother    Diabetes Brother     Social History   Socioeconomic History   Marital status: Married    Spouse name: Not on file   Number of children: Not on file   Years of education: Not on file   Highest education level: Not on file  Occupational History   Not on file  Tobacco Use   Smoking status: Every Day    Current packs/day: 0.25    Average packs/day: 0.3 packs/day for 20.0 years (5.0 ttl pk-yrs)    Types: Cigarettes   Smokeless tobacco: Never  Vaping Use    Vaping status: Former  Substance and Sexual Activity   Alcohol use: Yes    Alcohol/week: 14.0 standard drinks of alcohol    Types: 14 Glasses of wine per week    Comment: 2-3 times per week   Drug use: No   Sexual activity: Not Currently    Birth control/protection: Post-menopausal  Other Topics Concern   Not on file  Social History Narrative   Exercises rarely. Smokes tobacco. Works in a [employer] manager. Lives in [place]. Eats meat fruit and vegetables. Married for 18 years,lives with husband.   Social Determinants of Health   Financial Resource Strain: Not on file  Food Insecurity: Not on file  Transportation Needs: Not on file  Physical Activity: Not on file  Stress: Not on file  Social Connections: Not on file  Intimate Partner Violence: Not on file    Outpatient Medications Prior to Visit  Medication Sig Dispense Refill   ondansetron (ZOFRAN) 4 MG tablet Take 1 tablet (4 mg total) by mouth every 8 (eight) hours as needed for nausea or vomiting. 20 tablet 0   diazepam (VALIUM) 5 MG tablet TAKE 1 TABLET BY MOUTH DAILY AS NEEDED FOR SEVERE ANXIETY/ PANIC     EPINEPHrine (EPIPEN 2-PAK) 0.3 mg/0.3 mL IJ SOAJ injection Inject 0.3 mg  into the muscle as needed for anaphylaxis. 1 each 1   escitalopram (LEXAPRO) 20 MG tablet Take 20 mg by mouth daily.     levothyroxine (SYNTHROID) 50 MCG tablet TAKE 1 TABLET BY MOUTH EVERY DAY 90 tablet 1   metFORMIN (GLUCOPHAGE-XR) 500 MG 24 hr tablet Take 1 tablet (500 mg total) by mouth daily with breakfast. 90 tablet 1   nitroGLYCERIN (NITROSTAT) 0.4 MG SL tablet Place 1 tablet (0.4 mg total) under the tongue every 5 (five) minutes as needed. (Patient not taking: Reported on 10/07/2022) 25 tablet 3   Plecanatide (TRULANCE) 3 MG TABS Take 1 each by mouth daily. 90 tablet 1   polyethylene glycol-electrolytes (NULYTELY) 420 g solution Take 4,000 mLs by mouth once.     telmisartan (MICARDIS) 20 MG  tablet TAKE 1 TABLET BY MOUTH EVERY DAY 90 tablet 1   tirzepatide (MOUNJARO) 5 MG/0.5ML Pen INJECT 5 MG SUBCUTANEOUSLY WEEKLY 6 mL 1   WIXELA INHUB 100-50 MCG/ACT AEPB INHALE 1 PUFF INTO THE LUNGS TWICE A DAY 180 each 2   albuterol (PROAIR HFA) 108 (90 Base) MCG/ACT inhaler Inhale 2 puffs into the lungs every 6 (six) hours as needed. 18 g 6   Albuterol Sulfate, sensor, (PROAIR DIGIHALER) 108 (90 Base) MCG/ACT AEPB Inhale 2 puffs every 4 hours by inhalation route.     No facility-administered medications prior to visit.    Allergies  Allergen Reactions   Lidocaine Anaphylaxis, Other (See Comments) and Nausea And Vomiting   Other Hives and Rash    SEA MOSS    ROS Review of Systems  Constitutional:  Positive for fatigue. Negative for chills and fever.  HENT:  Negative for congestion, postnasal drip, sinus pressure, sinus pain and sore throat.   Eyes:  Negative for pain and discharge.  Respiratory:  Negative for cough and shortness of breath.   Cardiovascular:  Negative for chest pain and palpitations.  Gastrointestinal:  Negative for abdominal pain, diarrhea, nausea and vomiting.  Endocrine: Positive for heat intolerance. Negative for polydipsia and polyuria.  Genitourinary:  Negative for dysuria and hematuria.  Musculoskeletal:  Negative for neck pain and neck stiffness.  Skin:  Negative for rash.  Neurological:  Negative for dizziness and weakness.  Psychiatric/Behavioral:  Negative for agitation and behavioral problems. The patient is nervous/anxious.       Objective:    Physical Exam Vitals reviewed.  Constitutional:      General: She is not in acute distress.    Appearance: She is not diaphoretic.  HENT:     Head: Normocephalic and atraumatic.     Nose: No congestion.     Mouth/Throat:     Mouth: Mucous membranes are moist.  Eyes:     General: No scleral icterus.    Extraocular Movements: Extraocular movements intact.  Cardiovascular:     Rate and Rhythm: Normal  rate and regular rhythm.     Pulses: Normal pulses.     Heart sounds: Normal heart sounds. No murmur heard. Pulmonary:     Breath sounds: Normal breath sounds. No wheezing or rales.  Musculoskeletal:     Cervical back: Neck supple. No tenderness.     Right lower leg: No edema.     Left lower leg: No edema.  Skin:    General: Skin is warm.     Findings: No rash.  Neurological:     General: No focal deficit present.     Mental Status: She is alert and oriented to person, place, and  time.     Sensory: No sensory deficit.     Motor: Tremor (L > R hand) present. No weakness.  Psychiatric:        Mood and Affect: Mood normal.        Behavior: Behavior normal.     BP 115/76 (BP Location: Left Arm, Patient Position: Sitting, Cuff Size: Small)   Pulse 79   Ht 5\' 6"  (1.676 m)   Wt 136 lb (61.7 kg)   LMP 03/30/2011   SpO2 92%   BMI 21.95 kg/m  Wt Readings from Last 3 Encounters:  10/07/22 136 lb (61.7 kg)  06/08/22 145 lb 3.2 oz (65.9 kg)  04/06/22 152 lb 3.2 oz (69 kg)    Lab Results  Component Value Date   TSH 0.071 (L) 02/23/2022   Lab Results  Component Value Date   WBC 6.5 02/23/2022   HGB 15.6 02/23/2022   HCT 45.5 02/23/2022   MCV 97 02/23/2022   PLT 263 02/23/2022   Lab Results  Component Value Date   NA 136 02/23/2022   K 5.0 02/23/2022   CO2 21 02/23/2022   GLUCOSE 125 (H) 02/23/2022   BUN 10 02/23/2022   CREATININE 0.68 02/23/2022   BILITOT 0.3 02/23/2022   ALKPHOS 60 02/23/2022   AST 19 02/23/2022   ALT 26 02/23/2022   PROT 7.1 02/23/2022   ALBUMIN 4.7 02/23/2022   CALCIUM 9.9 02/23/2022   EGFR 103 02/23/2022   GFR 105.90 04/22/2010   Lab Results  Component Value Date   CHOL 165 09/03/2019   Lab Results  Component Value Date   HDL 67 09/03/2019   Lab Results  Component Value Date   LDLCALC 79 09/03/2019   Lab Results  Component Value Date   TRIG 107 09/03/2019   Lab Results  Component Value Date   CHOLHDL 2.5 09/03/2019   Lab  Results  Component Value Date   HGBA1C 6.4 (H) 06/08/2022      Assessment & Plan:   Problem List Items Addressed This Visit       Cardiovascular and Mediastinum   Essential hypertension - Primary    BP Readings from Last 1 Encounters:  10/07/22 115/76   Well-controlled with Telmisartan 20 mg QD Counseled for compliance with the medications Advised DASH diet and moderate exercise/walking, at least 150 mins/week        Respiratory   Asthma    Well-controlled with Wixela and PRN Albuterol Switched to [employer] instead of albuterol for optimal response      Relevant Medications   Albuterol-Budesonide (AIRSUPRA) 90-80 MCG/ACT AERO     Endocrine   Hypothyroidism    Lab Results  Component Value Date   TSH 0.071 (L) 02/23/2022   Decreased dose of levothyroxine to 50 mcg daily Symptoms  improved now Was on NP thyroid 90 mg QD Chart review suggests normal TSH in the past as well Check TSH and free T4      Type 2 diabetes mellitus (HCC)    Lab Results  Component Value Date   HGBA1C 6.4 (H) 06/08/2022   Associated with HTN and HLD On Metformin and Mounjaro She had persistent nausea with Trulicity, Rybelsus - switched to [facility], tolerating it better  Advised to follow diabetic diet On ARB now F/u CMP and lipid panel later Diabetic eye exam: Advised to follow up with Ophthalmology for diabetic eye exam        Other   Mixed hyperlipidemia    Discussed about statin in  the last visit Check lipid profile      Moderate episode of recurrent major depressive disorder (HCC)    Well-controlled She is on Lexapro and Valium currently, followed by psychiatry       Meds ordered this encounter  Medications   Albuterol-Budesonide (AIRSUPRA) 90-80 MCG/ACT AERO    Sig: Inhale 2 puffs into the lungs every other day as needed.    Dispense:  33 g    Refill:  2    Follow-up: Return in about 6 months (around 04/09/2023) for DM.    Anabel Halon, MD

## 2022-10-08 LAB — LIPID PANEL
Chol/HDL Ratio: 2 ratio (ref 0.0–4.4)
LDL Chol Calc (NIH): 76 mg/dL (ref 0–99)

## 2022-10-09 LAB — CMP14+EGFR
Bilirubin Total: 0.3 mg/dL (ref 0.0–1.2)
Creatinine, Ser: 0.62 mg/dL (ref 0.57–1.00)

## 2022-10-09 LAB — LIPID PANEL: VLDL Cholesterol Cal: 13 mg/dL (ref 5–40)

## 2022-10-09 LAB — TSH+FREE T4: TSH: 0.569 u[IU]/mL (ref 0.450–4.500)

## 2022-10-21 DIAGNOSIS — D485 Neoplasm of uncertain behavior of skin: Secondary | ICD-10-CM | POA: Diagnosis not present

## 2022-10-21 DIAGNOSIS — Z1283 Encounter for screening for malignant neoplasm of skin: Secondary | ICD-10-CM | POA: Diagnosis not present

## 2022-10-21 DIAGNOSIS — D225 Melanocytic nevi of trunk: Secondary | ICD-10-CM | POA: Diagnosis not present

## 2022-10-21 DIAGNOSIS — L821 Other seborrheic keratosis: Secondary | ICD-10-CM | POA: Diagnosis not present

## 2022-11-19 ENCOUNTER — Other Ambulatory Visit: Payer: Self-pay | Admitting: Internal Medicine

## 2022-11-19 DIAGNOSIS — Z1212 Encounter for screening for malignant neoplasm of rectum: Secondary | ICD-10-CM

## 2022-11-19 DIAGNOSIS — Z1211 Encounter for screening for malignant neoplasm of colon: Secondary | ICD-10-CM

## 2022-11-24 DIAGNOSIS — F411 Generalized anxiety disorder: Secondary | ICD-10-CM | POA: Diagnosis not present

## 2022-11-24 DIAGNOSIS — F410 Panic disorder [episodic paroxysmal anxiety] without agoraphobia: Secondary | ICD-10-CM | POA: Diagnosis not present

## 2022-11-26 DIAGNOSIS — Z1212 Encounter for screening for malignant neoplasm of rectum: Secondary | ICD-10-CM | POA: Diagnosis not present

## 2022-11-26 DIAGNOSIS — Z1211 Encounter for screening for malignant neoplasm of colon: Secondary | ICD-10-CM | POA: Diagnosis not present

## 2022-12-03 ENCOUNTER — Other Ambulatory Visit: Payer: Self-pay

## 2022-12-03 DIAGNOSIS — R195 Other fecal abnormalities: Secondary | ICD-10-CM

## 2022-12-03 LAB — COLOGUARD: COLOGUARD: POSITIVE — AB

## 2022-12-06 ENCOUNTER — Encounter (INDEPENDENT_AMBULATORY_CARE_PROVIDER_SITE_OTHER): Payer: Self-pay | Admitting: *Deleted

## 2022-12-09 ENCOUNTER — Encounter (INDEPENDENT_AMBULATORY_CARE_PROVIDER_SITE_OTHER): Payer: Self-pay | Admitting: Gastroenterology

## 2022-12-09 ENCOUNTER — Ambulatory Visit (INDEPENDENT_AMBULATORY_CARE_PROVIDER_SITE_OTHER): Payer: 59 | Admitting: Gastroenterology

## 2022-12-09 VITALS — BP 126/79 | HR 84 | Temp 99.1°F | Ht 66.0 in | Wt 135.8 lb

## 2022-12-09 DIAGNOSIS — K582 Mixed irritable bowel syndrome: Secondary | ICD-10-CM | POA: Diagnosis not present

## 2022-12-09 DIAGNOSIS — R195 Other fecal abnormalities: Secondary | ICD-10-CM | POA: Insufficient documentation

## 2022-12-09 NOTE — Patient Instructions (Signed)
Schedule colonoscopy Will attempt trial of Ibsrela 50 mg BID for 10 days, if presenting symptom improvement please call us to send prescription for this.

## 2022-12-09 NOTE — Progress Notes (Signed)
Patient reports that she tried Trulance for a short period of time but stopped as she felt  she had tenesmus but could not defecate. She is not currently on any medication for this.  Patient states she may have constipation for 5-6 days, followed by diarrhea for 3 days (2 Bms per day). States that this cycle perpetuates. Has had RLQ pain, which improves with defecation. Sometimes has some bloating.  The patient denies having any nausea, vomiting, fever, chills, hematochezia, melena, hematemesis,  diarrhea, jaundice, pruritus. Has lost weight due to [facility] and thyroid supplementation.

## 2022-12-09 NOTE — Progress Notes (Signed)
Gwendolyn [name], M.D. Gastroenterology & Hepatology [facility] [address] [place], [place] [address]  Primary Care Physician: Gwendolyn Halon, MD [address] [place] [address]  I will communicate my assessment and recommendations to the referring MD via EMR.  Problems: IBS Positive Cologuard  History of Present Illness: Gwendolyn [name] is a 57 y.o. female with PMH PMH DM, TIA, PCOs and IBS-M, who presents for follow up of IBS and positive Cologuard.  The patient was last seen on 08/10/2021. At that time, the patient had celiac disease panel and CRP checked which were within normal limits.  She was started on Trulance 3 mg every day after receiving a prescription for a bowel prep.  Patient reports that she tried Trulance for a short period of time but stopped as she felt  she had tenesmus but could not defecate. She is not currently on any medication for this.  Patient states she may have constipation for 5-6 days, followed by diarrhea for 3 days (2 Bms per day). States that this cycle perpetuates. Has had RLQ pain, which improves with defecation. Sometimes has some bloating.  The patient denies having any nausea, vomiting, fever, chills, hematochezia, melena, hematemesis,  diarrhea, jaundice, pruritus. Has lost weight due to [facility] and thyroid supplementation.  Patient had a positive Cologuard on 11/26/2022.  Most recent CBC on 02/23/2022 was within normal limits.  CMP on 10/07/2022 was normal as well.  Last EGD: Never Last Colonoscopy: Never  Past Medical History: Past Medical History:  Diagnosis Date   Diabetes mellitus without complication (HCC)    History of TIAs    Palpitations    Polycystic ovarian disease    Tobacco abuse     Past Surgical History: Past Surgical History:  Procedure Laterality Date   BREAST BIOPSY Left    TUBAL LIGATION      Family History: Family History  Problem Relation Age of Onset    Hypertension Mother    Diabetes Father    Breast cancer Maternal Grandmother    Diabetes Brother     Social History: Social History   Tobacco Use  Smoking Status Every Day   Current packs/day: 0.25   Average packs/day: 0.3 packs/day for 20.0 years (5.0 ttl pk-yrs)   Types: Cigarettes  Smokeless Tobacco Never   Social History   Substance and Sexual Activity  Alcohol Use Yes   Alcohol/week: 14.0 standard drinks of alcohol   Types: 14 Glasses of wine per week   Comment: 2-3 times per week   Social History   Substance and Sexual Activity  Drug Use No    Allergies: Allergies  Allergen Reactions   Lidocaine Anaphylaxis, Other (See Comments) and Nausea And Vomiting   Other Hives and Rash    SEA MOSS    Medications: Current Outpatient Medications  Medication Sig Dispense Refill   Albuterol-Budesonide (AIRSUPRA) 90-80 MCG/ACT AERO Inhale 2 puffs into the lungs every other day as needed. 33 g 2   diazepam (VALIUM) 5 MG tablet TAKE 1 TABLET BY MOUTH DAILY AS NEEDED FOR SEVERE ANXIETY/ PANIC     EPINEPHrine (EPIPEN 2-PAK) 0.3 mg/0.3 mL IJ SOAJ injection Inject 0.3 mg into the muscle as needed for anaphylaxis. 1 each 1   escitalopram (LEXAPRO) 20 MG tablet Take 20 mg by mouth daily.     levothyroxine (SYNTHROID) 50 MCG tablet TAKE 1 TABLET BY MOUTH EVERY DAY 90 tablet 1   metFORMIN (GLUCOPHAGE-XR) 500 MG 24 hr tablet  Take 1 tablet (500 mg total) by mouth daily with breakfast. 90 tablet 1   nitroGLYCERIN (NITROSTAT) 0.4 MG SL tablet Place 1 tablet (0.4 mg total) under the tongue every 5 (five) minutes as needed. 25 tablet 3   telmisartan (MICARDIS) 20 MG tablet TAKE 1 TABLET BY MOUTH EVERY DAY 90 tablet 1   tirzepatide (MOUNJARO) 5 MG/0.5ML Pen INJECT 5 MG SUBCUTANEOUSLY WEEKLY 6 mL 1   WIXELA INHUB 100-50 MCG/ACT AEPB INHALE 1 PUFF INTO THE LUNGS TWICE A DAY 180 each 2   No current facility-administered medications for this visit.    Review of Systems: GENERAL: negative for  malaise, night sweats HEENT: No changes in hearing or vision, no nose bleeds or other nasal problems. NECK: Negative for lumps, goiter, pain and significant neck swelling RESPIRATORY: Negative for cough, wheezing CARDIOVASCULAR: Negative for chest pain, leg swelling, palpitations, orthopnea GI: SEE HPI MUSCULOSKELETAL: Negative for joint pain or swelling, back pain, and muscle pain. SKIN: Negative for lesions, rash PSYCH: Negative for sleep disturbance, mood disorder and recent psychosocial stressors. HEMATOLOGY Negative for prolonged bleeding, bruising easily, and swollen nodes. ENDOCRINE: Negative for cold or heat intolerance, polyuria, polydipsia and goiter. NEURO: negative for tremor, gait imbalance, syncope and seizures. The remainder of the review of systems is noncontributory.   Physical Exam: BP 126/79 (BP Location: Left Arm, Patient Position: Sitting, Cuff Size: Normal)   Pulse 84   Temp 99.1 F (37.3 C) (Oral)   Ht 5\' 6"  (1.676 m)   Wt 135 lb 12.8 oz (61.6 kg)   LMP 03/30/2011   BMI 21.92 kg/m  GENERAL: The patient is AO x3, in no acute distress. HEENT: Head is normocephalic and atraumatic. EOMI are intact. Mouth is well hydrated and without lesions. NECK: Supple. No masses LUNGS: Clear to auscultation. No presence of rhonchi/wheezing/rales. Adequate chest expansion HEART: RRR, normal s1 and s2. ABDOMEN: Soft, nontender, no guarding, no peritoneal signs, and nondistended. BS +. No masses. EXTREMITIES: Without any cyanosis, clubbing, rash, lesions or edema. NEUROLOGIC: AOx3, no focal motor deficit. SKIN: no jaundice, no rashes  Imaging/Labs: as above  I personally reviewed and interpreted the available labs, imaging and endoscopic files.  Impression and Plan: Gwendolyn [name] is a 57 y.o. female with PMH PMH DM, TIA, PCOs and IBS-M, who presents for follow up of IBS and positive Cologuard.  Patient has presented persistent symptoms of fluctuation in her bowel  movements between constipation and diarrhea.  She has not tolerated the use of Linzess and did not have significant improvement with the use of Trulance.  We discussed the possibility of trying another agent that has a different mechanism of action such as Ibsrela.  Patient is open to try this, samples were provided today (20 pills).  Patient does not have any high risk factors for colorectal cancer malignancy. Discussed cologuard test results in detail, specifically what it means when the test is positive or negative.  Discussed that there is a possibility that even when the test is positive there may not be a polyp found on colonoscopy. More than 50% of the office visit was dedicated to discussing the procedure, including the day of and risks involved. Patient understands what the procedure involves including the benefits and any risks. Patient understands alternatives to the proposed procedure. Risks including (but not limited to) bleeding, tearing of the lining (perforation), rupture of adjacent organs, problems with heart and lung function, infection, and medication reactions. A small percentage of complications may require surgery, hospitalization, repeat  endoscopic procedure, and/or transfusion. A small percentage of polyps and other tumors may not be seen.  -Schedule colonoscopy -Will attempt trial of Ibsrela 50 mg BID for 10 days (samples provided), if presenting symptom improvement please call us to send prescription for this.  All questions were answered.      Gwendolyn Blazing, MD Gastroenterology and Hepatology [facility]

## 2022-12-09 NOTE — H&P (View-Only) (Signed)
 [name], M.D. Gastroenterology & Hepatology [facility] [address] [place], [place] [address]  Primary Care Physician: Anabel Halon, MD [address] [place] [address]  I will communicate my assessment and recommendations to the referring MD via EMR.  Problems: IBS Positive Cologuard  History of Present Illness: Gwendolyn [name] is a 57 y.o. female with PMH PMH DM, TIA, PCOs and IBS-M, who presents for follow up of IBS and positive Cologuard.  The patient was last seen on 08/10/2021. At that time, the patient had celiac disease panel and CRP checked which were within normal limits.  She was started on Trulance 3 mg every day after receiving a prescription for a bowel prep.  Patient reports that she tried Trulance for a short period of time but stopped as she felt  she had tenesmus but could not defecate. She is not currently on any medication for this.  Patient states she may have constipation for 5-6 days, followed by diarrhea for 3 days (2 Bms per day). States that this cycle perpetuates. Has had RLQ pain, which improves with defecation. Sometimes has some bloating.  The patient denies having any nausea, vomiting, fever, chills, hematochezia, melena, hematemesis,  diarrhea, jaundice, pruritus. Has lost weight due to [facility] and thyroid supplementation.  Patient had a positive Cologuard on 11/26/2022.  Most recent CBC on 02/23/2022 was within normal limits.  CMP on 10/07/2022 was normal as well.  Last EGD: Never Last Colonoscopy: Never  Past Medical History: Past Medical History:  Diagnosis Date   Diabetes mellitus without complication (HCC)    History of TIAs    Palpitations    Polycystic ovarian disease    Tobacco abuse     Past Surgical History: Past Surgical History:  Procedure Laterality Date   BREAST BIOPSY Left    TUBAL LIGATION      Family History: Family History  Problem Relation Age of Onset    Hypertension Mother    Diabetes Father    Breast cancer Maternal Grandmother    Diabetes Brother     Social History: Social History   Tobacco Use  Smoking Status Every Day   Current packs/day: 0.25   Average packs/day: 0.3 packs/day for 20.0 years (5.0 ttl pk-yrs)   Types: Cigarettes  Smokeless Tobacco Never   Social History   Substance and Sexual Activity  Alcohol Use Yes   Alcohol/week: 14.0 standard drinks of alcohol   Types: 14 Glasses of wine per week   Comment: 2-3 times per week   Social History   Substance and Sexual Activity  Drug Use No    Allergies: Allergies  Allergen Reactions   Lidocaine Anaphylaxis, Other (See Comments) and Nausea And Vomiting   Other Hives and Rash    SEA MOSS    Medications: Current Outpatient Medications  Medication Sig Dispense Refill   Albuterol-Budesonide (AIRSUPRA) 90-80 MCG/ACT AERO Inhale 2 puffs into the lungs every other day as needed. 33 g 2   diazepam (VALIUM) 5 MG tablet TAKE 1 TABLET BY MOUTH DAILY AS NEEDED FOR SEVERE ANXIETY/ PANIC     EPINEPHrine (EPIPEN 2-PAK) 0.3 mg/0.3 mL IJ SOAJ injection Inject 0.3 mg into the muscle as needed for anaphylaxis. 1 each 1   escitalopram (LEXAPRO) 20 MG tablet Take 20 mg by mouth daily.     levothyroxine (SYNTHROID) 50 MCG tablet TAKE 1 TABLET BY MOUTH EVERY DAY 90 tablet 1   metFORMIN (GLUCOPHAGE-XR) 500 MG 24 hr tablet  Take 1 tablet (500 mg total) by mouth daily with breakfast. 90 tablet 1   nitroGLYCERIN (NITROSTAT) 0.4 MG SL tablet Place 1 tablet (0.4 mg total) under the tongue every 5 (five) minutes as needed. 25 tablet 3   telmisartan (MICARDIS) 20 MG tablet TAKE 1 TABLET BY MOUTH EVERY DAY 90 tablet 1   tirzepatide (MOUNJARO) 5 MG/0.5ML Pen INJECT 5 MG SUBCUTANEOUSLY WEEKLY 6 mL 1   WIXELA INHUB 100-50 MCG/ACT AEPB INHALE 1 PUFF INTO THE LUNGS TWICE A DAY 180 each 2   No current facility-administered medications for this visit.    Review of Systems: GENERAL: negative for  malaise, night sweats HEENT: No changes in hearing or vision, no nose bleeds or other nasal problems. NECK: Negative for lumps, goiter, pain and significant neck swelling RESPIRATORY: Negative for cough, wheezing CARDIOVASCULAR: Negative for chest pain, leg swelling, palpitations, orthopnea GI: SEE HPI MUSCULOSKELETAL: Negative for joint pain or swelling, back pain, and muscle pain. SKIN: Negative for lesions, rash PSYCH: Negative for sleep disturbance, mood disorder and recent psychosocial stressors. HEMATOLOGY Negative for prolonged bleeding, bruising easily, and swollen nodes. ENDOCRINE: Negative for cold or heat intolerance, polyuria, polydipsia and goiter. NEURO: negative for tremor, gait imbalance, syncope and seizures. The remainder of the review of systems is noncontributory.   Physical Exam: BP 126/79 (BP Location: Left Arm, Patient Position: Sitting, Cuff Size: Normal)   Pulse 84   Temp 99.1 F (37.3 C) (Oral)   Ht 5\' 6"  (1.676 m)   Wt 135 lb 12.8 oz (61.6 kg)   LMP 03/30/2011   BMI 21.92 kg/m  GENERAL: The patient is AO x3, in no acute distress. HEENT: Head is normocephalic and atraumatic. EOMI are intact. Mouth is well hydrated and without lesions. NECK: Supple. No masses LUNGS: Clear to auscultation. No presence of rhonchi/wheezing/rales. Adequate chest expansion HEART: RRR, normal s1 and s2. ABDOMEN: Soft, nontender, no guarding, no peritoneal signs, and nondistended. BS +. No masses. EXTREMITIES: Without any cyanosis, clubbing, rash, lesions or edema. NEUROLOGIC: AOx3, no focal motor deficit. SKIN: no jaundice, no rashes  Imaging/Labs: as above  I personally reviewed and interpreted the available labs, imaging and endoscopic files.  Impression and Plan: Gwendolyn [name] is a 57 y.o. female with PMH PMH DM, TIA, PCOs and IBS-M, who presents for follow up of IBS and positive Cologuard.  Patient has presented persistent symptoms of fluctuation in her bowel  movements between constipation and diarrhea.  She has not tolerated the use of Linzess and did not have significant improvement with the use of Trulance.  We discussed the possibility of trying another agent that has a different mechanism of action such as Ibsrela.  Patient is open to try this, samples were provided today (20 pills).  Patient does not have any high risk factors for colorectal cancer malignancy. Discussed cologuard test results in detail, specifically what it means when the test is positive or negative.  Discussed that there is a possibility that even when the test is positive there may not be a polyp found on colonoscopy. More than 50% of the office visit was dedicated to discussing the procedure, including the day of and risks involved. Patient understands what the procedure involves including the benefits and any risks. Patient understands alternatives to the proposed procedure. Risks including (but not limited to) bleeding, tearing of the lining (perforation), rupture of adjacent organs, problems with heart and lung function, infection, and medication reactions. A small percentage of complications may require surgery, hospitalization, repeat  endoscopic procedure, and/or transfusion. A small percentage of polyps and other tumors may not be seen.  -Schedule colonoscopy -Will attempt trial of Ibsrela 50 mg BID for 10 days (samples provided), if presenting symptom improvement please call us to send prescription for this.  All questions were answered.      [name], MD Gastroenterology and Hepatology [facility]

## 2022-12-10 NOTE — Addendum Note (Signed)
Addended by: Dolores Frame on: 12/10/2022 03:47 PM   Modules accepted: Level of Service

## 2022-12-13 ENCOUNTER — Encounter (INDEPENDENT_AMBULATORY_CARE_PROVIDER_SITE_OTHER): Payer: Self-pay

## 2022-12-13 MED ORDER — PEG 3350-KCL-NA BICARB-NACL 420 G PO SOLR: 4000.0000 mL | Freq: Once | ORAL | 0 refills | Status: AC

## 2022-12-13 NOTE — Addendum Note (Signed)
Addended by: Marlowe Shores on: 12/13/2022 01:39 PM   Modules accepted: Orders

## 2022-12-14 ENCOUNTER — Other Ambulatory Visit: Payer: Self-pay | Admitting: Internal Medicine

## 2022-12-14 DIAGNOSIS — E1169 Type 2 diabetes mellitus with other specified complication: Secondary | ICD-10-CM

## 2022-12-17 ENCOUNTER — Other Ambulatory Visit: Payer: Self-pay | Admitting: Internal Medicine

## 2022-12-17 DIAGNOSIS — E1169 Type 2 diabetes mellitus with other specified complication: Secondary | ICD-10-CM

## 2022-12-29 ENCOUNTER — Encounter (HOSPITAL_COMMUNITY): Payer: Self-pay | Admitting: Gastroenterology

## 2022-12-29 ENCOUNTER — Ambulatory Visit (HOSPITAL_COMMUNITY)
Admission: RE | Admit: 2022-12-29 | Discharge: 2022-12-29 | Disposition: A | Discharge status: Home / Self Care | Payer: 59 | Attending: Gastroenterology | Admitting: Gastroenterology

## 2022-12-29 ENCOUNTER — Encounter (HOSPITAL_COMMUNITY)
Admission: RE | Disposition: A | Discharge status: Home / Self Care | Payer: Self-pay | Source: Home / Self Care | Attending: Gastroenterology

## 2022-12-29 ENCOUNTER — Ambulatory Visit (HOSPITAL_COMMUNITY): Payer: 59 | Admitting: Anesthesiology

## 2022-12-29 ENCOUNTER — Ambulatory Visit (HOSPITAL_BASED_OUTPATIENT_CLINIC_OR_DEPARTMENT_OTHER): Payer: 59 | Admitting: Anesthesiology

## 2022-12-29 ENCOUNTER — Other Ambulatory Visit: Payer: Self-pay

## 2022-12-29 DIAGNOSIS — Z7984 Long term (current) use of oral hypoglycemic drugs: Secondary | ICD-10-CM | POA: Insufficient documentation

## 2022-12-29 DIAGNOSIS — J45909 Unspecified asthma, uncomplicated: Secondary | ICD-10-CM | POA: Diagnosis not present

## 2022-12-29 DIAGNOSIS — D124 Benign neoplasm of descending colon: Secondary | ICD-10-CM | POA: Diagnosis not present

## 2022-12-29 DIAGNOSIS — F17210 Nicotine dependence, cigarettes, uncomplicated: Secondary | ICD-10-CM | POA: Diagnosis not present

## 2022-12-29 DIAGNOSIS — D126 Benign neoplasm of colon, unspecified: Secondary | ICD-10-CM | POA: Diagnosis not present

## 2022-12-29 DIAGNOSIS — E282 Polycystic ovarian syndrome: Secondary | ICD-10-CM | POA: Insufficient documentation

## 2022-12-29 DIAGNOSIS — Z7985 Long-term (current) use of injectable non-insulin antidiabetic drugs: Secondary | ICD-10-CM | POA: Diagnosis not present

## 2022-12-29 DIAGNOSIS — D122 Benign neoplasm of ascending colon: Secondary | ICD-10-CM | POA: Diagnosis not present

## 2022-12-29 DIAGNOSIS — D123 Benign neoplasm of transverse colon: Secondary | ICD-10-CM | POA: Diagnosis not present

## 2022-12-29 DIAGNOSIS — I10 Essential (primary) hypertension: Secondary | ICD-10-CM | POA: Insufficient documentation

## 2022-12-29 DIAGNOSIS — D120 Benign neoplasm of cecum: Secondary | ICD-10-CM | POA: Diagnosis not present

## 2022-12-29 DIAGNOSIS — R195 Other fecal abnormalities: Secondary | ICD-10-CM

## 2022-12-29 DIAGNOSIS — E119 Type 2 diabetes mellitus without complications: Secondary | ICD-10-CM | POA: Insufficient documentation

## 2022-12-29 DIAGNOSIS — F32A Depression, unspecified: Secondary | ICD-10-CM | POA: Diagnosis not present

## 2022-12-29 DIAGNOSIS — Z8673 Personal history of transient ischemic attack (TIA), and cerebral infarction without residual deficits: Secondary | ICD-10-CM | POA: Diagnosis not present

## 2022-12-29 DIAGNOSIS — K648 Other hemorrhoids: Secondary | ICD-10-CM | POA: Diagnosis not present

## 2022-12-29 DIAGNOSIS — Z1211 Encounter for screening for malignant neoplasm of colon: Secondary | ICD-10-CM | POA: Diagnosis not present

## 2022-12-29 DIAGNOSIS — E039 Hypothyroidism, unspecified: Secondary | ICD-10-CM | POA: Diagnosis not present

## 2022-12-29 DIAGNOSIS — D128 Benign neoplasm of rectum: Secondary | ICD-10-CM | POA: Insufficient documentation

## 2022-12-29 DIAGNOSIS — K582 Mixed irritable bowel syndrome: Secondary | ICD-10-CM | POA: Insufficient documentation

## 2022-12-29 HISTORY — PX: POLYPECTOMY: SHX149

## 2022-12-29 HISTORY — DX: Sleep apnea, unspecified: G47.30

## 2022-12-29 HISTORY — PX: ENDOSCOPIC MUCOSAL RESECTION: SHX6839

## 2022-12-29 HISTORY — DX: Unspecified asthma, uncomplicated: J45.909

## 2022-12-29 HISTORY — PX: HEMOSTASIS CLIP PLACEMENT: SHX6857

## 2022-12-29 HISTORY — PX: SUBMUCOSAL LIFTING INJECTION: SHX6855

## 2022-12-29 HISTORY — PX: SCLEROTHERAPY: SHX6841

## 2022-12-29 HISTORY — PX: COLONOSCOPY WITH PROPOFOL: SHX5780

## 2022-12-29 LAB — GLUCOSE, CAPILLARY: Glucose-Capillary: 105 mg/dL — ABNORMAL HIGH (ref 70–99)

## 2022-12-29 LAB — HM COLONOSCOPY

## 2022-12-29 SURGERY — COLONOSCOPY WITH PROPOFOL
Anesthesia: General

## 2022-12-29 MED ORDER — SPOT INK MARKER SYRINGE KIT
PACK | SUBMUCOSAL | Status: AC
  Filled 2022-12-29: qty 5

## 2022-12-29 MED ORDER — STERILE WATER FOR IRRIGATION IR SOLN
Status: DC | PRN
  Administered 2022-12-29: 60 mL

## 2022-12-29 MED ORDER — PROPOFOL 500 MG/50ML IV EMUL
INTRAVENOUS | Status: DC | PRN
  Administered 2022-12-29: 150 ug/kg/min via INTRAVENOUS

## 2022-12-29 MED ORDER — MIDAZOLAM HCL 2 MG/2ML IJ SOLN
INTRAMUSCULAR | Status: AC
  Filled 2022-12-29: qty 2

## 2022-12-29 MED ORDER — PROPOFOL 10 MG/ML IV BOLUS
INTRAVENOUS | Status: DC | PRN
Start: 2022-12-29 — End: 2022-12-29
  Administered 2022-12-29: 50 mg via INTRAVENOUS
  Administered 2022-12-29: 100 mg via INTRAVENOUS
  Administered 2022-12-29 (×6): 50 mg via INTRAVENOUS

## 2022-12-29 MED ORDER — LACTATED RINGERS IV SOLN: INTRAVENOUS | Status: DC | PRN

## 2022-12-29 MED ORDER — MIDAZOLAM HCL 2 MG/2ML IJ SOLN
INTRAMUSCULAR | Status: DC | PRN
  Administered 2022-12-29: 2 mg via INTRAVENOUS

## 2022-12-29 MED ORDER — PROPOFOL 1000 MG/100ML IV EMUL
INTRAVENOUS | Status: AC
  Filled 2022-12-29: qty 100

## 2022-12-29 NOTE — Transfer of Care (Signed)
Immediate Anesthesia Transfer of Care Note  Patient: Gwendolyn [name]  Procedure(s) Performed: COLONOSCOPY WITH PROPOFOL POLYPECTOMY INTESTINAL SCLEROTHERAPY SUBMUCOSAL LIFTING INJECTION HEMOSTASIS CLIP PLACEMENT ENDOSCOPIC MUCOSAL RESECTION  Patient Location: Endoscopy Unit  Anesthesia Type:General  Level of Consciousness: drowsy  Airway & Oxygen Therapy: Patient Spontanous Breathing  Post-op Assessment: Report given to RN and Post -op Vital signs reviewed and stable  Post vital signs: Reviewed and stable  Last Vitals:  Vitals Value Taken Time  BP    Temp    Pulse    Resp    SpO2      Last Pain:  Vitals:   12/29/22 0754  TempSrc: Oral  PainSc: 0-No pain      Patients Stated Pain Goal: 8 (12/29/22 0754)  Complications: No notable events documented.

## 2022-12-29 NOTE — Discharge Instructions (Addendum)
You are being discharged to home.  Eat a soft diet for two days.  We are waiting for your pathology results.  Your physician has recommended a repeat colonoscopy for retreatment. Will discuss with advanced endoscopist for referral and evaluation.

## 2022-12-29 NOTE — Anesthesia Postprocedure Evaluation (Signed)
Anesthesia Post Note  Patient: Gwendolyn [name]  Procedure(s) Performed: COLONOSCOPY WITH PROPOFOL POLYPECTOMY INTESTINAL SCLEROTHERAPY SUBMUCOSAL LIFTING INJECTION HEMOSTASIS CLIP PLACEMENT ENDOSCOPIC MUCOSAL RESECTION  Patient location during evaluation: PACU Anesthesia Type: General Level of consciousness: awake and alert Pain management: pain level controlled Vital Signs Assessment: post-procedure vital signs reviewed and stable Respiratory status: spontaneous breathing, nonlabored ventilation, respiratory function stable and patient connected to nasal cannula oxygen Cardiovascular status: blood pressure returned to baseline and stable Postop Assessment: no apparent nausea or vomiting Anesthetic complications: no   There were no known notable events for this encounter.   Last Vitals:  Vitals:   12/29/22 0754 12/29/22 1053  BP: 123/70 (!) 161/76  Pulse: 89 73  Resp: 18 14  Temp: 37 C (!) 36.4 C  SpO2: 95% 99%    Last Pain:  Vitals:   12/29/22 1100  TempSrc:   PainSc: 0-No pain                 Camira Geidel L Zyon Grout

## 2022-12-29 NOTE — Interval H&P Note (Signed)
History and Physical Interval Note:  12/29/2022 7:34 AM  Gwendolyn [name]  has presented today for surgery, with the diagnosis of POSITIVE COLOGUARD.  The various methods of treatment have been discussed with the patient and family. After consideration of risks, benefits and other options for treatment, the patient has consented to  Procedure(s) with comments: COLONOSCOPY WITH PROPOFOL (N/A) - 9:00AM;ASA 1 as a surgical intervention.  The patient's history has been reviewed, patient examined, no change in status, stable for surgery.  I have reviewed the patient's chart and labs.  Questions were answered to the patient's satisfaction.     [name] Mayorga

## 2022-12-29 NOTE — Anesthesia Preprocedure Evaluation (Addendum)
Anesthesia Evaluation  Patient identified by MRN, date of birth, ID band Patient awake    Reviewed: Allergy & Precautions, H&P , NPO status , Patient's Chart, lab work & pertinent test results, reviewed documented beta blocker date and time   Airway Mallampati: II  TM Distance: >3 FB Neck ROM: full    Dental no notable dental hx. (+) Dental Advisory Given, Teeth Intact   Pulmonary asthma , Current Smoker   Pulmonary exam normal breath sounds clear to auscultation       Cardiovascular Exercise Tolerance: Good hypertension, Normal cardiovascular exam Rhythm:regular Rate:Normal  Palpitations   Neuro/Psych  PSYCHIATRIC DISORDERS  Depression    TIA   GI/Hepatic negative GI ROS, Neg liver ROS,,,  Endo/Other  diabetesHypothyroidism    Renal/GU negative Renal ROS  negative genitourinary   Musculoskeletal   Abdominal   Peds  Hematology negative hematology ROS (+)   Anesthesia Other Findings   Reproductive/Obstetrics negative OB ROS                              Anesthesia Physical Anesthesia Plan  ASA: 3  Anesthesia Plan: General   Post-op Pain Management: Minimal or no pain anticipated   Induction: Intravenous  PONV Risk Score and Plan: Propofol infusion  Airway Management Planned: Nasal Cannula and Natural Airway  Additional Equipment: None  Intra-op Plan:   Post-operative Plan:   Informed Consent: I have reviewed the patients History and Physical, chart, labs and discussed the procedure including the risks, benefits and alternatives for the proposed anesthesia with the patient or authorized representative who has indicated his/her understanding and acceptance.     Dental Advisory Given  Plan Discussed with: CRNA  Anesthesia Plan Comments:          Anesthesia Quick Evaluation

## 2022-12-29 NOTE — Op Note (Addendum)
[facility] Patient Name: Gwendolyn [name] Procedure Date: 12/29/2022 8:57 AM MRN: [id number] Date of Birth: Feb 11, 1966 Attending MD: [name] , , [id number] CSN: [id number] Age: 57 Admit Type: Outpatient Procedure:                Colonoscopy Indications:              Positive Cologuard test Providers:                [name], Crystal Page, Francoise Ceo RN, RN,                            Dyann Ruddle Referring MD:             [name] Medicines:                Monitored Anesthesia Care Complications:            No immediate complications. Estimated Blood Loss:     Estimated blood loss: none. Procedure:                Pre-Anesthesia Assessment:                           - Prior to the procedure, a History and Physical                            was performed, and patient medications, allergies                            and sensitivities were reviewed. The patient's                            tolerance of previous anesthesia was reviewed.                           - The risks and benefits of the procedure and the                            sedation options and risks were discussed with the                            patient. All questions were answered and informed                            consent was obtained.                           - ASA Grade Assessment: II - A patient with mild                            systemic disease.                           After obtaining informed consent, the colonoscope                            was passed under direct vision. Throughout the  procedure, the patient's blood pressure, pulse, and                            oxygen saturations were monitored continuously. The                            [id number]-HQ190L ([id number]) scope was introduced through                            the anus and advanced to the the cecum, identified                            by appendiceal orifice and ileocecal valve. The                             colonoscopy was performed without difficulty. The                            patient tolerated the procedure well. The quality                            of the bowel preparation was excellent.                           22 Modifier was used because the patient underwent                            a total of 7 (seven) EMRs in one single session                            with an estimated procedural time of 2 hours. Scope In: 9:08:56 AM Scope Out: 10:50:04 AM Scope Withdrawal Time: 1 hour 17 minutes 31 seconds  Total Procedure Duration: 1 hour 41 minutes 8 seconds  Findings:      The perianal and digital rectal examinations were normal.      A 25 to 30 mm polyp was found in the cecum. The polyp was flat. Area was       successfully injected with 12 mL Eleview for a lift polypectomy. Imaging       was performed using white light and narrow band imaging to visualize the       mucosa and demarcate the polyp site after injection for EMR purposes.       The polyp was removed with a hot snare in a piecemeal fashion. Resection       and retrieval were complete. To prevent bleeding after the polypectomy,       five hemostatic clips (one of them was a Ultra) were successfully       placed. Clip manufacturer: [employer]. There was no bleeding at       the end of the procedure.      Two flat polyps were found in the ascending colon. The polyps were 20 to       25 mm in size. Polyps were hemicricumferential, not removed today.       Polypectomy was not attempted due to polyp size (too large to be  excised).      A 10 mm polyp was found in the ascending colon. The polyp was sessile.       The polyp was removed with a cold snare. Resection and retrieval were       complete.      A 12 mm polyp was found in the ascending colon. The polyp was       semi-sessile. Area was successfully injected with 1 mL Eleview for a       lift polypectomy. Imaging was performed  using white light and narrow       band imaging to visualize the mucosa and demarcate the polyp site after       injection for EMR purposes. The polyp was removed with a hot snare.       Resection and retrieval were complete. To prevent bleeding after the       polypectomy, one hemostatic clip was successfully placed. Clip       manufacturer: [employer]. There was no bleeding at the end of the       procedure.      A 12 mm polyp was found in the ascending colon. The polyp was flat. Area       was successfully injected with 4 mL Eleview for a lift polypectomy.       Imaging was performed using white light and narrow band imaging to       visualize the mucosa and demarcate the polyp site after injection for       EMR purposes. The polyp was removed with a hot snare. Resection and       retrieval were complete.      An 8 mm polyp was found in the transverse colon. The polyp was       semi-sessile. The polyp was removed with a cold snare. Resection and       retrieval were complete.      A 12 mm polyp was found in the transverse colon. The polyp was       semi-sessile. The polyp was removed with a hot snare. Resection and       retrieval were complete.      A 12 mm polyp was found in the transverse colon. The polyp was flat.       Area was successfully injected with 3 mL Eleview for a lift polypectomy.       Imaging was performed using white light and narrow band imaging to       visualize the mucosa and demarcate the polyp site after injection for       EMR purposes. The polyp was removed with a hot snare. Resection and       retrieval were complete. To prevent bleeding after the polypectomy,       three hemostatic clips were successfully placed. Clip manufacturer:       [employer]. There was no bleeding at the end of the procedure.      A 12 mm polyp was found in the transverse colon. The polyp was flat.       Area was successfully injected with 1 mL Eleview for a lift  polypectomy.       Imaging was performed using white light and narrow band imaging to       visualize the mucosa and demarcate the polyp site after injection for       EMR purposes. The polyp was removed with a hot snare. Resection and  retrieval were complete.      A 14 mm polyp was found in the transverse colon. The polyp was flat.       Area was successfully injected with 1 mL Eleview for a lift polypectomy.       Imaging was performed using white light and narrow band imaging to       visualize the mucosa and demarcate the polyp site after injection for       EMR purposes. The polyp was removed with a hot snare. Resection and       retrieval were complete.      A 10 mm polyp was found in the transverse colon. The polyp was flat.       Area was successfully injected with 1 mL Eleview for a lift polypectomy.       Imaging was performed using white light and narrow band imaging to       visualize the mucosa and demarcate the polyp site after injection for       EMR purposes. The polyp was removed with a hot snare. Resection and       retrieval were complete.      Four flat polyps were found in the descending colon and transverse       colon. The polyps were 8 to 15 mm in size. Polypectomy was not attempted.      Three flat polyps were found in the descending colon. The polyps were 5       to 10 mm in size. These polyps were removed with a cold snare. Resection       and retrieval were complete. To prevent bleeding after the polypectomy,       two hemostatic clips were successfully placed at one of the polypectomy       sites. Clip manufacturer: [employer]. There was no bleeding at       the end of the procedure.      A 5 mm polyp was found in the rectum. The polyp was sessile. The polyp       was removed with a hot snare. Resection and retrieval were complete.      Non-bleeding internal hemorrhoids were found during retroflexion. The       hemorrhoids were medium-sized.       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 Resected and retrieved via EMR in a                            piecemeal fashion. Clips were placed. Clip                            manufacturer: [employer].                           - Two 20 to 25 mm polyps in the ascending colon.                            Resection not attempted.                           -  One 10 mm polyp in the ascending colon, removed                            with a cold snare. Resected and retrieved.                           - One 12 mm polyp in the ascending colon, removed                            with a hot snare. Resected and retrieved via EMR.                            Clip was placed. Clip manufacturer: [employer].                           - One 12 mm polyp in the ascending colon, removed                            with a hot snare. Resected and retrieved via EMR.                           - One 8 mm polyp in the transverse colon, removed                            with a cold snare. Resected and retrieved.                           - One 12 mm polyp in the transverse colon, removed                            with a hot snare. Resected and retrieved.                           - One 12 mm polyp in the transverse colon, removed                            with a hot snare. Resected and retrieved via EMR.                            Clips were placed. Clip manufacturer: [employer].                           - One 12 mm polyp in the transverse colon, removed                            with a hot snare. Resected and retrieved via EMR.                           -  One 14 mm polyp in the transverse colon, removed                            with a hot snare. Resected and retrieved via EMR.                           - One  10 mm polyp in the transverse colon, removed                            with a hot snare. Resected and retrieved via EMR.                           - Four 8 to 15 mm polyps in the descending colon                            and in the transverse colon. Resection not                            attempted.                           - Three 5 to 10 mm polyps in the descending colon,                            removed with a cold snare. Resected and retrieved.                            Clips were placed. Clip manufacturer: [employer].                           - One 5 mm polyp in the rectum, removed with a hot                            snare. Resected and retrieved.                           - Non-bleeding internal hemorrhoids. Moderate Sedation:      Per Anesthesia Care Recommendation:           - Discharge patient to home (ambulatory).                           - Soft diet for 2 days.                           - Start taking one capful of Miralax every day,                            uptitrate up to 3 capfuls per day as needed                           -  Await pathology results.                           - Repeat colonoscopy for retreatment is needed -                            will discuss with advanced endoscopist for referral                            and evaluation. Procedure Code(s):        --- Professional ---                           [phone], 22,59, Colonoscopy, flexible; with removal                            of tumor(s), polyp(s), or other lesion(s) by snare                            technique                           45381, Colonoscopy, flexible; with directed                            submucosal injection(s), any substance Diagnosis Code(s):        --- Professional ---                           D12.0, Benign neoplasm of cecum                           D12.2, Benign neoplasm of ascending colon                           D12.3, Benign  neoplasm of transverse colon (hepatic                            flexure or splenic flexure)                           D12.8, Benign neoplasm of rectum                           D12.4, Benign neoplasm of descending colon                           K64.8, Other hemorrhoids                           R19.5, Other fecal abnormalities CPT copyright 2022 American Medical Association. All rights reserved. The codes documented in this report are preliminary and upon coder review may  be revised to meet current compliance requirements. [name], MD [name],  12/29/2022 11:14:34 AM This report has been signed electronically. Number of Addenda: 0

## 2022-12-29 NOTE — Anesthesia Procedure Notes (Addendum)
Date/Time: 12/29/2022 9:03 AM  Performed by: Julian Reil, CRNAPre-anesthesia Checklist: Patient identified, Emergency Drugs available, Suction available and Patient being monitored Patient Re-evaluated:Patient Re-evaluated prior to induction Oxygen Delivery Method: Nasal cannula Induction Type: IV induction Placement Confirmation: positive ETCO2

## 2022-12-30 ENCOUNTER — Encounter (INDEPENDENT_AMBULATORY_CARE_PROVIDER_SITE_OTHER): Payer: Self-pay | Admitting: *Deleted

## 2022-12-30 ENCOUNTER — Telehealth (INDEPENDENT_AMBULATORY_CARE_PROVIDER_SITE_OTHER): Payer: Self-pay | Admitting: *Deleted

## 2022-12-30 LAB — SURGICAL PATHOLOGY

## 2022-12-30 NOTE — Telephone Encounter (Signed)
-----   Message from [name] Mayorga sent at 12/29/2022  6:12 PM EST ----- Regarding: FW:  Hi Zarian Colpitts, Hope you're good. Can we please refer this patient to [facility] advanced endoscopy? Dx: complex polypectomy, sessile serrated polyposis syndrome. Thanks ----- Message ----- From: Lemar Lofty., MD Sent: 12/29/2022   4:45 PM EST To: Dolores Frame, MD  DCM, At least Cologuard works. Agree SPS syndrome so you'll be scoping her every 1-2 years. I am already scheduling into January and February procedures and am actually going to have to truncate my schedule in the beginning of the year. This patient if they need EMR, needs to be sent out unfortunately for now. Sorry. GM ----- Message ----- From: Dolores Frame, MD Sent: 12/29/2022   4:24 PM EST To: Lemar Lofty., MD  [facility] you are doing great. I got while interesting patient.  I had her scheduled for a colonoscopy for possible Cologuard.  Funches a crazy amount of polyps, very flat and had to 7 endoscopic mucosal resections in 1 session (procedure to close to 2 hours).  I suspect she has sessile serrated polyposis syndrome based on the appearance of the polyps, although I do not have the pathology back yet.  There were at least 2 large (2-2.5 cm) polyps that I did not attempt to remove in the ascending colon as they were hemicircumferential.  Wanted to get your thoughts about helping me remove these 2.  There are other several serrated polyps I did not remove in the transverse and descending colon (I will say at least 6 more) but I ran out of time.  Please let me know your thoughts.  Thanks,  Reuel Boom

## 2022-12-30 NOTE — Telephone Encounter (Signed)
Referral sent, they will contact patient with apt

## 2023-01-03 ENCOUNTER — Encounter (INDEPENDENT_AMBULATORY_CARE_PROVIDER_SITE_OTHER): Payer: Self-pay | Admitting: Gastroenterology

## 2023-01-04 ENCOUNTER — Encounter (HOSPITAL_COMMUNITY): Payer: Self-pay | Admitting: Gastroenterology

## 2023-01-10 DIAGNOSIS — Z6823 Body mass index (BMI) 23.0-23.9, adult: Secondary | ICD-10-CM | POA: Diagnosis not present

## 2023-01-10 DIAGNOSIS — Z01419 Encounter for gynecological examination (general) (routine) without abnormal findings: Secondary | ICD-10-CM | POA: Diagnosis not present

## 2023-01-10 DIAGNOSIS — Z1231 Encounter for screening mammogram for malignant neoplasm of breast: Secondary | ICD-10-CM | POA: Diagnosis not present

## 2023-01-10 DIAGNOSIS — Z124 Encounter for screening for malignant neoplasm of cervix: Secondary | ICD-10-CM | POA: Diagnosis not present

## 2023-01-13 ENCOUNTER — Other Ambulatory Visit: Payer: Self-pay | Admitting: Internal Medicine

## 2023-01-13 DIAGNOSIS — E039 Hypothyroidism, unspecified: Secondary | ICD-10-CM

## 2023-02-01 ENCOUNTER — Other Ambulatory Visit: Payer: Self-pay | Admitting: Genetic Counselor

## 2023-02-01 ENCOUNTER — Inpatient Hospital Stay: Payer: 59 | Attending: Nurse Practitioner | Admitting: Genetic Counselor

## 2023-02-01 ENCOUNTER — Inpatient Hospital Stay: Payer: 59

## 2023-02-01 DIAGNOSIS — Z1379 Encounter for other screening for genetic and chromosomal anomalies: Secondary | ICD-10-CM

## 2023-02-01 DIAGNOSIS — Z800 Family history of malignant neoplasm of digestive organs: Secondary | ICD-10-CM

## 2023-02-01 DIAGNOSIS — Z860109 Personal history of other colon polyps: Secondary | ICD-10-CM | POA: Diagnosis not present

## 2023-02-01 DIAGNOSIS — F17210 Nicotine dependence, cigarettes, uncomplicated: Secondary | ICD-10-CM | POA: Diagnosis not present

## 2023-02-01 DIAGNOSIS — Z803 Family history of malignant neoplasm of breast: Secondary | ICD-10-CM | POA: Diagnosis not present

## 2023-02-01 LAB — GENETIC SCREENING ORDER

## 2023-02-02 ENCOUNTER — Encounter: Payer: Self-pay | Admitting: Genetic Counselor

## 2023-02-02 NOTE — Progress Notes (Signed)
REFERRING PROVIDER: Dolores Frame, MD [phone] [place],  [place] [address]  PRIMARY PROVIDER:  Anabel Halon, MD  PRIMARY REASON FOR VISIT:  1. Personal history of other colon polyps   2. Family history of breast cancer   3. Family history of pancreatic cancer    HISTORY OF PRESENT ILLNESS:   Gwendolyn [name], a 57 y.o. female, was seen for a [place] cancer genetics consultation at the request of Dr. Marguerita Merles due to a personal history of colon polyps.  Gwendolyn [name] presents to clinic today to discuss the possibility of a hereditary predisposition to cancer, to discuss genetic testing, and to further clarify her future cancer risks, as well as potential cancer risks for family members.   Gwendolyn [name] had a colonoscopy in November 2024 and 20 polyps were identified. Pathology showed the resected polyps were all sessile serrated.  CANCER HISTORY:  Oncology History   No history exists.   RISK FACTORS:  Menarche was at age 35.  First live birth at age 64.  OCP use for approximately 5-6 years.  Ovaries intact: yes.  Uterus intact: yes.  Menopausal status: postmenopausal, menopause at age 48 HRT use: 5 years of combined therapy, stopped ~1 year ago Mammogram within the last year: yes. Number of breast biopsies: 1 benign biopsy ~10 years ago Any excessive radiation exposure in the past: no  Past Medical History:  Diagnosis Date   Asthma    Diabetes mellitus without complication (HCC)    History of TIAs    Palpitations    Polycystic ovarian disease    Sleep apnea    Tobacco abuse     Past Surgical History:  Procedure Laterality Date   BREAST BIOPSY Left    COLONOSCOPY WITH PROPOFOL N/A 12/29/2022   Procedure: COLONOSCOPY WITH PROPOFOL;  Surgeon: Dolores Frame, MD;  [facility];  Service: Gastroenterology;  Laterality: N/A;  9:00AM;ASA 1   ENDOSCOPIC MUCOSAL RESECTION  12/29/2022   Procedure: ENDOSCOPIC MUCOSAL  RESECTION;  Surgeon: Dolores Frame, MD;  [facility];  Service: Gastroenterology;;   HEMOSTASIS CLIP PLACEMENT  12/29/2022   Procedure: HEMOSTASIS CLIP PLACEMENT;  Surgeon: Dolores Frame, MD;  [facility];  Service: Gastroenterology;;   POLYPECTOMY  12/29/2022   Procedure: POLYPECTOMY INTESTINAL;  Surgeon: Dolores Frame, MD;  [facility];  Service: Gastroenterology;;   Susa Day  12/29/2022   Procedure: Susa Day;  Surgeon: Marguerita Merles, Reuel Boom, MD;  [facility];  Service: Gastroenterology;;   Sunnie Nielsen LIFTING INJECTION  12/29/2022   Procedure: SUBMUCOSAL LIFTING INJECTION;  Surgeon: Dolores Frame, MD;  [facility];  Service: Gastroenterology;;   TUBAL LIGATION     TUMOR REMOVAL Left    leg    Social History   Socioeconomic History   Marital status: Married    Spouse name: Not on file   Number of children: Not on file   Years of education: Not on file   Highest education level: Not on file  Occupational History   Not on file  Tobacco Use   Smoking status: Every Day    Current packs/day: 0.25    Average packs/day: 0.3 packs/day for 20.0 years (5.0 ttl pk-yrs)    Types: Cigarettes   Smokeless tobacco: Never  Vaping Use   Vaping status: Former  Substance and Sexual Activity   Alcohol use: Yes    Alcohol/week: 14.0 standard drinks of alcohol  Types: 14 Glasses of wine per week    Comment: couple glasses per day   Drug use: No   Sexual activity: Not Currently    Birth control/protection: Post-menopausal  Other Topics Concern   Not on file  Social History Narrative   Exercises rarely. Smokes tobacco. Works in a [employer] manager. Lives in [place]. Eats meat fruit and vegetables. Married for 18 years,lives with husband.   Social Drivers of Corporate investment banker Strain: Not on file  Food  Insecurity: Not on file  Transportation Needs: Not on file  Physical Activity: Not on file  Stress: Not on file  Social Connections: Not on file     FAMILY HISTORY:  We obtained a detailed, 4-generation family history.  Significant diagnoses are listed below: Family History  Problem Relation Age of Onset   Hypertension Mother    Diabetes Father    Colon polyps Father    Melanoma Father 22   Diabetes Brother    Breast cancer Maternal Grandmother 43   Colon polyps Paternal Grandmother    Breast cancer Other 17 - 55       paternal grandfather's sister   Pancreatic cancer Paternal Great-grandmother        paternal grandmother's mother     Gwendolyn [name] is unaware of previous family history of genetic testing for hereditary cancer risks. There is no reported Ashkenazi Jewish ancestry.  GENETIC COUNSELING ASSESSMENT: Gwendolyn [name] is a 57 y.o. female with a personal history of colon polyps which is somewhat suggestive of a hereditary predisposition to polyposis/cancer. We, therefore, discussed and recommended the following at today's visit.   DISCUSSION: We discussed that 5 - 10% of cancer/polyposis is hereditary. There are numerous genes associated with both cancer and polyposis. We discussed that testing is beneficial for several reasons including knowing how to follow individuals and understanding if other family members could be at risk for cancer and allowing them to undergo genetic testing.   We reviewed the characteristics, features and inheritance patterns of hereditary cancer syndromes. We also discussed genetic testing, including the appropriate family members to test, the process of testing, insurance coverage and turn-around-time for results. We discussed the implications of a negative, positive, carrier and/or variant of uncertain significant result. We recommended Gwendolyn [name] pursue genetic testing for a panel that includes genes associated with cancer and polyposis.   [name] was offered a common hereditary cancer panel (39 genes+RNF43) and an expanded pan-cancer panel (76 genes+RNF43). Gwendolyn [name] was informed of the benefits and limitations of each panel, including that expanded pan-cancer panels contain genes that do not have clear management guidelines at this point in time.  We also discussed that as the number of genes included on a panel increases, the chances of variants of uncertain significance increases. After considering the benefits and limitations of each gene panel, Gwendolyn [name] elected to have [facility] CustomNext Panel ([facility] CancerNext-Expanded Panel+ RNF43).  The CustomNext gene panel offered by [facility] and includes sequencing, rearrangement, and RNA analysis for the following 77 genes: AIP, ALK, APC, ATM, AXIN2, BAP1, BARD1, BMPR1A, BRCA1, BRCA2, BRIP1, CDC73, CDH1, CDK4, CDKN1B, CDKN2A, CEBPA, CHEK2, CTNNA1, DDX41, DICER1, ETV6, FH, FLCN, GATA2, LZTR1, MAX, MBD4, MEN1, MET, MLH1, MSH2, MSH3, MSH6, MUTYH, NF1, NF2, NTHL1, PALB2, PHOX2B, PMS2, POT1, PRKAR1A, PTCH1, PTEN, RAD51C, RAD51D, RB1, RET, RNF43, RUNX1, SDHA, SDHAF2, SDHB, SDHC, SDHD, SMAD4, SMARCA4, SMARCB1, SMARCE1, STK11, SUFU, TMEM127, TP53, TSC1, TSC2, VHL, and WT1 (sequencing and deletion/duplication); EGFR,  HOXB13, KIT, MITF, PDGFRA, POLD1, and POLE (sequencing only); EPCAM and GREM1 (deletion/duplication only).    Based on Gwendolyn [name] personal history of cancer, she meets medical criteria for genetic testing. Despite that she meets criteria, she may still have an out of pocket cost. We discussed that if her out of pocket cost for testing is over $100, the laboratory should contact them to discuss self-pay prices, patient pay assistance programs, if applicable, and other billing options.  PLAN: After considering the risks, benefits, and limitations, Gwendolyn [name] provided informed consent to pursue genetic testing and the blood sample was sent to [facility] for analysis of the  CustomNext Panel. Results should be available within approximately 2-3 weeks' time, at which point they will be disclosed by telephone to Gwendolyn [name], as will any additional recommendations warranted by these results. Gwendolyn [name] will receive a summary of her genetic counseling visit and a copy of her results once available. This information will also be available in Epic.   Gwendolyn [name] questions were answered to her satisfaction today. Our contact information was provided should additional questions or concerns arise. Thank you for the referral and allowing [place] to share in the care of your patient.   Lalla Brothers, MS, [facility] Genetic Counselor [email or web address] (P) [phone]  The patient was seen for a total of 30 minutes in face-to-face genetic counseling. The patient was seen alone.  Drs. Pamelia Hoit and/or Mosetta Putt were available to discuss this case as needed.   _______________________________________________________________________ For Office Staff:  Number of people involved in session: 1 Was an Intern/ student involved with case: no

## 2023-02-20 ENCOUNTER — Other Ambulatory Visit: Payer: Self-pay | Admitting: Internal Medicine

## 2023-02-20 DIAGNOSIS — E1169 Type 2 diabetes mellitus with other specified complication: Secondary | ICD-10-CM

## 2023-02-21 ENCOUNTER — Other Ambulatory Visit: Payer: Self-pay

## 2023-02-21 ENCOUNTER — Telehealth: Payer: Self-pay | Admitting: Internal Medicine

## 2023-02-21 ENCOUNTER — Encounter: Payer: Self-pay | Admitting: Internal Medicine

## 2023-02-21 NOTE — Telephone Encounter (Signed)
 I came across this pt that has 2 metformin prescriptions on her med list and looks like both of them are being refilled. One is metformin 500 and the other is metformin XR 500. Which should she be taking and I will cancel the other one. Thanks

## 2023-02-21 NOTE — Telephone Encounter (Signed)
 Called pharmacy and lvm to d/c metformin 500mg  and to only dispense the metformin 500 XR

## 2023-02-22 ENCOUNTER — Encounter: Payer: Self-pay | Admitting: Genetic Counselor

## 2023-02-22 ENCOUNTER — Ambulatory Visit: Payer: Self-pay | Admitting: Genetic Counselor

## 2023-02-22 DIAGNOSIS — Z1379 Encounter for other screening for genetic and chromosomal anomalies: Secondary | ICD-10-CM | POA: Insufficient documentation

## 2023-02-22 NOTE — Progress Notes (Signed)
 HPI:   Gwendolyn [name] was previously seen in the [facility] due to a personal history of colon polyps and concerns regarding a hereditary predisposition to cancer. Please refer to our prior cancer genetics clinic note for more information regarding our discussion, assessment and recommendations, at the time. Gwendolyn [name] recent genetic test results were disclosed to her, as were recommendations warranted by these results. These results and recommendations are discussed in more detail below.  CANCER HISTORY:  Oncology History   No history exists.    [name] HISTORY:  We obtained a detailed, 4-generation [name] history.  Significant diagnoses are listed below:      [name] History  Problem Relation Age of Onset   Hypertension Mother     Diabetes Father     Colon polyps Father     Melanoma Father 4   Diabetes Brother     Breast cancer Maternal Grandmother 5   Colon polyps Paternal Grandmother     Breast cancer Other 33 - 56        paternal grandfather's sister   Pancreatic cancer Paternal Great-grandmother          paternal grandmother's mother            Gwendolyn [name] is unaware of previous [name] history of genetic testing for hereditary cancer risks. There is no reported Ashkenazi Jewish ancestry.   GENETIC TEST RESULTS:  The [facility] CustomNext Panel found no pathogenic mutations.  The CustomNext panel offered by [facility] and includes sequencing, rearrangement, and RNA analysis for the following 77 genes (CancerNext-Expanded Panel+ RNF43): AIP, ALK, APC, ATM, AXIN2, BAP1, BARD1, BMPR1A, BRCA1, BRCA2, BRIP1, CDC73, CDH1, CDK4, CDKN1B, CDKN2A, CEBPA, CHEK2, CTNNA1, DDX41, DICER1, ETV6, FH, FLCN, GATA2, LZTR1, MAX, MBD4, MEN1, MET, MLH1, MSH2, MSH3, MSH6, MUTYH, NF1, NF2, NTHL1, PALB2, PHOX2B, PMS2, POT1, PRKAR1A, PTCH1, PTEN, RAD51C, RAD51D, RB1, RET, RNF43, RUNX1, SDHA, SDHAF2, SDHB, SDHC, SDHD, SMAD4, SMARCA4, SMARCB1, SMARCE1, STK11, SUFU, TMEM127, TP53,  TSC1, TSC2, VHL, and WT1 (sequencing and deletion/duplication); EGFR, HOXB13, KIT, MITF, PDGFRA, POLD1, and POLE (sequencing only); EPCAM and GREM1 (deletion/duplication only).   The test report has been scanned into EPIC and is located under the Molecular Pathology section of the Results Review tab.  A portion of the [name] report is included below for reference. Genetic testing reported out on 02/22/2023.       Even though a pathogenic variant was not identified, possible explanations for her personal history of polyps and/or the cancer in the [name] may include: There may be no hereditary risk for polyps/cancer in the [name]. Her polyps and [name] history of cancer may be due to other genetic or environmental factors. There may be a gene mutation in one of these genes that current testing methods cannot detect, but that chance is small. There could be another gene that has not yet been discovered, or that we have not yet tested, that is responsible for her polyps and [name] history of cancer.  It is also possible there is a hereditary cause for the cancer in the [name] that Gwendolyn [name] did not inherit.  ADDITIONAL GENETIC TESTING:  We discussed with Gwendolyn [name] that her genetic testing was fairly extensive.  If there are genes identified to increase cancer risk that can be analyzed in the future, we would be happy to discuss and coordinate this testing at that time.    CANCER SCREENING RECOMMENDATIONS:  Gwendolyn [name] is considered negative (normal). This means that we have not  identified a hereditary cause for her personal history of polyps or [name] history of cancer at this time.   An individual's cancer risk and medical management are not determined by genetic test results alone. Overall cancer risk assessment incorporates additional factors, including personal medical history, [name] history, and any available genetic information that may [name] in a personalized plan for cancer  prevention and surveillance. Therefore, it is recommended she continue to follow the cancer management and screening guidelines provided by her primary healthcare provider.  Based on the reported personal and [name] history, specific cancer screenings for Gwendolyn [name] include:  Breast Cancer Screening:  The [name] model is one of multiple prediction models developed to estimate an individual's lifetime risk of developing breast cancer. The [name] model is endorsed by the [employer] (NCCN). This model includes many risk factors such as [name] history, endogenous estrogen exposure, and benign breast disease. The calculation is highly-dependent on the accuracy of clinical data provided by the patient and can change over time. The [name] model may be repeated to reflect new information in her personal or [name] history in the future.   Gwendolyn [name] risk score is 8.4%. She is encouraged to continue to be mindful of her [name] history and be diligent with general population breast screening, including annual mammograms. She is encouraged to contact us  regarding any changes to her personal or [name] history, as her recommendations for screening would be altered significantly if her lifetime risk is determined to be greater than 20% based on updated information.       RECOMMENDATIONS FOR [name] MEMBERS:   Since she did not inherit a mutation in a cancer predisposition gene included on this panel, her children could not have inherited a mutation from her in one of these genes. We recommend women in this [name] have a yearly mammogram beginning at age 49, or 52 years younger than the earliest onset of cancer, an annual clinical breast exam, and perform monthly breast self-exams.  FOLLOW-UP:  Cancer genetics is a rapidly advancing field and it is possible that new genetic tests will be appropriate for her and/or her [name]  members in the future. We encouraged her to remain in contact with cancer genetics on an annual basis so we can update her personal and [name] histories and let her know of advances in cancer genetics that may benefit this [name].   Our contact number was provided. Gwendolyn [name] questions were answered to her satisfaction, and she knows she is welcome to call us  at anytime with additional questions or concerns.   Gwendolyn Vanpelt, MS, [facility] Genetic Counselor [email or web address] (P) [phone]

## 2023-02-24 ENCOUNTER — Encounter (INDEPENDENT_AMBULATORY_CARE_PROVIDER_SITE_OTHER): Payer: Self-pay | Admitting: Gastroenterology

## 2023-04-08 DIAGNOSIS — S0191XA Laceration without foreign body of unspecified part of head, initial encounter: Secondary | ICD-10-CM | POA: Diagnosis not present

## 2023-04-08 DIAGNOSIS — S098XXA Other specified injuries of head, initial encounter: Secondary | ICD-10-CM | POA: Diagnosis not present

## 2023-04-08 DIAGNOSIS — S0181XA Laceration without foreign body of other part of head, initial encounter: Secondary | ICD-10-CM | POA: Diagnosis not present

## 2023-04-11 ENCOUNTER — Ambulatory Visit: Payer: Self-pay | Admitting: Internal Medicine

## 2023-04-11 NOTE — Telephone Encounter (Signed)
  Chief Complaint: requesting appt for removal of stitches from fall last week  Symptoms: fell hitting head. Required stitches to forehead Frequency: last week approx. 04/09/23 Pertinent Negatives: Patient denies headaches, no N/V/no severe pain no dizziness Disposition: [] ED /[] Urgent Care (no appt availability in office) / [] Appointment(In office/virtual)/ []  [facility]/ [] Home Care/ [] Refused Recommended Disposition /[] [facility]/ [x]  Follow-up with PCP Additional Notes:   Patient requesting OV for removal of stitches within 10 day of fall which will be 04/18/23 per patient. No available appt to scheduled patient. NT  Called [facility] and was told  to advise patient will need to call office on 04/18/23 to request appt. Unabl to schedule for acute visit . Please advise if appt can be scheduled to remove stitches for 04/18/23 . Please call patient back to schedule appt.      Copied from CRM [id number]. Topic: Clinical - Red Word Triage >> Apr 11, 2023  8:46 AM Clayton Bibles wrote: Red Word that prompted transfer to Nurse Triage: Larey Seat last week and had stitches in her forehead and needs them removed Reason for Disposition  [1] Fall AND [2] went to emergency department for evaluation or treatment  Answer Assessment - Initial Assessment Questions 1. MECHANISM: "How did the fall happen?"     Fell last week required stitches.  2. DOMESTIC VIOLENCE AND ELDER ABUSE SCREENING: "Did you fall because someone pushed you or tried to hurt you?" If Yes, ask: "Are you safe now?"     na 3. ONSET: "When did the fall happen?" (e.g., minutes, hours, or days ago)     Larey Seat last week out of town. Approx 04/09/23 4. LOCATION: "What part of the body hit the ground?" (e.g., back, buttocks, head, hips, knees, hands, head, stomach)     head 5. INJURY: "Did you hurt (injure) yourself when you fell?" If Yes, ask: "What did you injure? Tell me more about this?" (e.g., body area; type of injury; pain severity)"      na 6. PAIN: "Is there any pain?" If Yes, ask: "How bad is the pain?" (e.g., Scale 1-10; or mild,  moderate, severe)   - NONE (0): No pain   - MILD (1-3): Doesn't interfere with normal activities    - MODERATE (4-7): Interferes with normal activities or awakens from sleep    - SEVERE (8-10): Excruciating pain, unable to do any normal activities      na 7. SIZE: For cuts, bruises, or swelling, ask: "How large is it?" (e.g., inches or centimeters)      Stitches to forehead 8. PREGNANCY: "Is there any chance you are pregnant?" "When was your last menstrual period?"     na 9. OTHER SYMPTOMS: "Do you have any other symptoms?" (e.g., dizziness, fever, weakness; new onset or worsening).     Eyes black  10. CAUSE: "What do you think caused the fall (or falling)?" (e.g., tripped, dizzy spell)       na  Protocols used: Falls and [facility]

## 2023-04-18 ENCOUNTER — Ambulatory Visit: Payer: 59 | Admitting: Internal Medicine

## 2023-05-18 ENCOUNTER — Other Ambulatory Visit: Payer: Self-pay | Admitting: Internal Medicine

## 2023-05-18 DIAGNOSIS — J4530 Mild persistent asthma, uncomplicated: Secondary | ICD-10-CM

## 2023-05-18 DIAGNOSIS — I10 Essential (primary) hypertension: Secondary | ICD-10-CM

## 2023-06-06 ENCOUNTER — Ambulatory Visit (INDEPENDENT_AMBULATORY_CARE_PROVIDER_SITE_OTHER): Payer: 59 | Admitting: Gastroenterology

## 2023-06-26 ENCOUNTER — Encounter: Payer: Self-pay | Admitting: Internal Medicine

## 2023-06-26 ENCOUNTER — Other Ambulatory Visit: Payer: Self-pay | Admitting: Internal Medicine

## 2023-06-26 DIAGNOSIS — E1169 Type 2 diabetes mellitus with other specified complication: Secondary | ICD-10-CM

## 2023-06-27 ENCOUNTER — Other Ambulatory Visit: Payer: Self-pay | Admitting: Internal Medicine

## 2023-06-27 NOTE — Telephone Encounter (Signed)
 Called patient will keep 06.16 appointment

## 2023-07-26 ENCOUNTER — Telehealth: Payer: Self-pay | Admitting: Internal Medicine

## 2023-07-26 NOTE — Telephone Encounter (Signed)
 Patient already had eye exam done.  [facility]

## 2023-07-27 NOTE — Telephone Encounter (Signed)
Faxed for records

## 2023-08-01 ENCOUNTER — Encounter: Payer: Self-pay | Admitting: Internal Medicine

## 2023-08-01 ENCOUNTER — Ambulatory Visit: Admitting: Internal Medicine

## 2023-08-01 VITALS — BP 119/77 | HR 79 | Ht 66.0 in | Wt 130.8 lb

## 2023-08-01 DIAGNOSIS — E039 Hypothyroidism, unspecified: Secondary | ICD-10-CM

## 2023-08-01 DIAGNOSIS — Z7985 Long-term (current) use of injectable non-insulin antidiabetic drugs: Secondary | ICD-10-CM | POA: Diagnosis not present

## 2023-08-01 DIAGNOSIS — J4530 Mild persistent asthma, uncomplicated: Secondary | ICD-10-CM | POA: Diagnosis not present

## 2023-08-01 DIAGNOSIS — F3341 Major depressive disorder, recurrent, in partial remission: Secondary | ICD-10-CM | POA: Diagnosis not present

## 2023-08-01 DIAGNOSIS — E782 Mixed hyperlipidemia: Secondary | ICD-10-CM

## 2023-08-01 DIAGNOSIS — K582 Mixed irritable bowel syndrome: Secondary | ICD-10-CM | POA: Diagnosis not present

## 2023-08-01 DIAGNOSIS — I10 Essential (primary) hypertension: Secondary | ICD-10-CM | POA: Diagnosis not present

## 2023-08-01 DIAGNOSIS — E1169 Type 2 diabetes mellitus with other specified complication: Secondary | ICD-10-CM | POA: Diagnosis not present

## 2023-08-01 DIAGNOSIS — Z23 Encounter for immunization: Secondary | ICD-10-CM

## 2023-08-01 MED ORDER — MOUNJARO 5 MG/0.5ML ~~LOC~~ SOAJ: 5.0000 mg | SUBCUTANEOUS | 5 refills | Status: DC

## 2023-08-01 NOTE — Assessment & Plan Note (Signed)
 Well-controlled with Wixela and PRN Airsupra 

## 2023-08-01 NOTE — Assessment & Plan Note (Deleted)
Smokes about 0.20 pack/day  Asked about quitting: confirms that he/she currently smokes cigarettes Advise to quit smoking: Educated about QUITTING to reduce the risk of cancer, cardio and cerebrovascular disease. Assess willingness: Unwilling to quit at this time, but is working on cutting back. Assist with counseling and pharmacotherapy: Counseled for 5 minutes and literature provided. Arrange for follow up: follow up in 3 months and continue to offer help. 

## 2023-08-01 NOTE — Patient Instructions (Signed)
 Please continue to take medications as prescribed.  Please continue to follow low carb diet and perform moderate exercise/walking at least 150 mins/week.

## 2023-08-01 NOTE — Progress Notes (Addendum)
 Established Patient Office Visit  Subjective:  Patient ID: Gwendolyn [name], female    DOB: [birth year]  Age: 58 y.o. MRN: [id number]  CC:  Chief Complaint  Patient presents with   Medical Management of Chronic Issues    Follow up visit.     HPI Gwendolyn [name] is a 58 y.o. female with past medical history of HTN, asthma, type II DM, hypothyroidism and tobacco abuse who presents for f/u of her chronic medical conditions.  Type II DM: She currently takes metformin  and has been taking Mounjaro  5 mg QW.  She denies any nausea or vomiting with Mounjaro  now. Her HbA1c had improved to 6.1 in 08/24. She denies any polyuria or polyphagia currently.  Hypothyroidism: She was having jitteriness, tremors and palpitations, which have improved since decreasing dose of levothyroxine  to 50 mcg.  Her thyroid  function testing showed oversupplementation in 01/24, but improved later after decreasing dose of levothyroxine . Denies any recent change in weight or appetite.  Asthma: She uses Wixela currently.  She uses Airsupra  as needed for dyspnea or wheezing.  Denies any recent asthma flareup.   Past Medical History:  Diagnosis Date   Asthma    Diabetes mellitus without complication (HCC)    History of TIAs    Palpitations    Polycystic ovarian disease    Sleep apnea    Tobacco abuse     Past Surgical History:  Procedure Laterality Date   BREAST BIOPSY Left    COLONOSCOPY WITH PROPOFOL  N/A 12/29/2022   Procedure: COLONOSCOPY WITH PROPOFOL ;  Surgeon: Urban Garden, MD;  [facility];  Service: Gastroenterology;  Laterality: N/A;  9:00AM;ASA 1   ENDOSCOPIC MUCOSAL RESECTION  12/29/2022   Procedure: ENDOSCOPIC MUCOSAL RESECTION;  Surgeon: Umberto Ganong, Bearl Limes, MD;  Location: [facility];  Service: Gastroenterology;;   HEMOSTASIS CLIP PLACEMENT  12/29/2022   Procedure: HEMOSTASIS CLIP PLACEMENT;  Surgeon: Urban Garden, MD;  [facility];  Service:  Gastroenterology;;   POLYPECTOMY  12/29/2022   Procedure: POLYPECTOMY INTESTINAL;  Surgeon: Urban Garden, MD;  [facility];  Service: Gastroenterology;;   Daryle Eon  12/29/2022   Procedure: Daryle Eon;  Surgeon: Umberto Ganong, Bearl Limes, MD;  [facility];  Service: Gastroenterology;;   [place] LIFTING INJECTION  12/29/2022   Procedure: SUBMUCOSAL LIFTING INJECTION;  Surgeon: Urban Garden, MD;  [facility];  Service: Gastroenterology;;   TUBAL LIGATION     TUMOR REMOVAL Left    leg    Family History  Problem Relation Age of Onset   Hypertension Mother    Diabetes Father    Colon polyps Father    Melanoma Father 61   Diabetes Brother    Breast cancer Maternal Grandmother 2   Colon polyps Paternal Grandmother    Breast cancer Other 70 - 69       paternal grandfather's sister   Pancreatic cancer Paternal Great-grandmother        paternal grandmother's mother    Social History   Socioeconomic History   Marital status: Married    Spouse name: Not on file   Number of children: Not on file   Years of education: Not on file   Highest education level: Not on file  Occupational History   Not on file  Tobacco Use   Smoking status: Every Day    Current packs/day: 0.25    Average packs/day: 0.3 packs/day for 20.0 years (5.0 ttl pk-yrs)    Types:  Cigarettes   Smokeless tobacco: Never  Vaping Use   Vaping status: Former  Substance and Sexual Activity   Alcohol use: Yes    Alcohol/week: 14.0 standard drinks of alcohol    Types: 14 Glasses of wine per week    Comment: couple glasses per day   Drug use: No   Sexual activity: Not Currently    Birth control/protection: Post-menopausal  Other Topics Concern   Not on file  Social History Narrative   Exercises rarely. Smokes tobacco. Works in a [employer] manager. Lives in [place] . Eats meat fruit and vegetables.  Married for 18 years,lives with husband.   Social Drivers of Corporate investment banker Strain: Not on file  Food Insecurity: Not on file  Transportation Needs: Not on file  Physical Activity: Not on file  Stress: Not on file  Social Connections: Not on file  Intimate Partner Violence: Not on file    Outpatient Medications Prior to Visit  Medication Sig Dispense Refill   Albuterol -Budesonide (AIRSUPRA ) 90-80 MCG/ACT AERO Inhale 2 puffs into the lungs every other day as needed. 33 g 2   diazepam (VALIUM) 5 MG tablet 10 mg.     EPINEPHrine  (EPIPEN  2-PAK) 0.3 mg/0.3 mL IJ SOAJ injection Inject 0.3 mg into the muscle as needed for anaphylaxis. 1 each 1   escitalopram  (LEXAPRO ) 20 MG tablet Take 20 mg by mouth daily.     levothyroxine  (SYNTHROID ) 50 MCG tablet TAKE 1 TABLET BY MOUTH EVERY DAY 90 tablet 1   metFORMIN  (GLUCOPHAGE -XR) 500 MG 24 hr tablet TAKE 1 TABLET BY MOUTH EVERY DAY WITH BREAKFAST 90 tablet 1   nitroGLYCERIN  (NITROSTAT ) 0.4 MG SL tablet Place 1 tablet (0.4 mg total) under the tongue every 5 (five) minutes as needed. 25 tablet 3   telmisartan  (MICARDIS ) 20 MG tablet TAKE 1 TABLET BY MOUTH EVERY DAY 90 tablet 1   WIXELA INHUB 100-50 MCG/ACT AEPB INHALE 1 PUFF INTO THE LUNGS TWICE A DAY 180 each 2   tirzepatide  (MOUNJARO ) 5 MG/0.5ML Pen INJECT 5 MG SUBCUTANEOUSLY WEEKLY 2 mL 1   No facility-administered medications prior to visit.    Allergies  Allergen Reactions   Lidocaine Anaphylaxis, Other (See Comments) and Nausea And Vomiting   Other Hives and Rash    SEA MOSS    ROS Review of Systems  Constitutional:  Positive for fatigue. Negative for chills and fever.  HENT:  Negative for congestion, postnasal drip, sinus pressure, sinus pain and sore throat.   Eyes:  Negative for pain and discharge.  Respiratory:  Negative for cough and shortness of breath.   Cardiovascular:  Negative for chest pain and palpitations.  Gastrointestinal:  Negative for abdominal pain,  diarrhea, nausea and vomiting.  Endocrine: Positive for heat intolerance. Negative for polydipsia and polyuria.  Genitourinary:  Negative for dysuria and hematuria.  Musculoskeletal:  Negative for neck pain and neck stiffness.  Skin:  Negative for rash.  Neurological:  Negative for dizziness and weakness.  Psychiatric/Behavioral:  Negative for agitation and behavioral problems. The patient is nervous/anxious.       Objective:    Physical Exam Vitals reviewed.  Constitutional:      General: She is not in acute distress.    Appearance: She is not diaphoretic.  HENT:     Head: Normocephalic and atraumatic.     Nose: No congestion.     Mouth/Throat:     Mouth: Mucous membranes are moist.   Eyes:  General: No scleral icterus.    Extraocular Movements: Extraocular movements intact.    Cardiovascular:     Rate and Rhythm: Normal rate and regular rhythm.     Pulses: Normal pulses.     Heart sounds: Normal heart sounds. No murmur heard. Pulmonary:     Breath sounds: Normal breath sounds. No wheezing or rales.   Musculoskeletal:     Cervical back: Neck supple. No tenderness.     Right lower leg: No edema.     Left lower leg: No edema.   Skin:    General: Skin is warm.     Findings: No rash.   Neurological:     General: No focal deficit present.     Mental Status: She is alert and oriented to person, place, and time.     Sensory: No sensory deficit.     Motor: Tremor (L > R hand) present. No weakness.   Psychiatric:        Mood and Affect: Mood normal.        Behavior: Behavior normal.     BP 119/77   Pulse 79   Ht 5' 6 (1.676 m)   Wt 130 lb 12.8 oz (59.3 kg)   LMP 03/30/2011   SpO2 95%   BMI 21.11 kg/m  Wt Readings from Last 3 Encounters:  08/01/23 130 lb 12.8 oz (59.3 kg)  12/29/22 134 lb (60.8 kg)  12/09/22 135 lb 12.8 oz (61.6 kg)    Lab Results  Component Value Date   TSH 0.569 10/07/2022   Lab Results  Component Value Date   WBC 6.5  02/23/2022   HGB 15.6 02/23/2022   HCT 45.5 02/23/2022   MCV 97 02/23/2022   PLT 263 02/23/2022   Lab Results  Component Value Date   NA 141 10/07/2022   K 4.4 10/07/2022   CO2 24 10/07/2022   GLUCOSE 102 (H) 10/07/2022   BUN 12 10/07/2022   CREATININE 0.62 10/07/2022   BILITOT 0.3 10/07/2022   ALKPHOS 62 10/07/2022   AST 20 10/07/2022   ALT 26 10/07/2022   PROT 6.8 10/07/2022   ALBUMIN 4.7 10/07/2022   CALCIUM 10.1 10/07/2022   EGFR 105 10/07/2022   GFR 105.90 04/22/2010   Lab Results  Component Value Date   CHOL 178 10/07/2022   Lab Results  Component Value Date   HDL 89 10/07/2022   Lab Results  Component Value Date   LDLCALC 76 10/07/2022   Lab Results  Component Value Date   TRIG 69 10/07/2022   Lab Results  Component Value Date   CHOLHDL 2.0 10/07/2022   Lab Results  Component Value Date   HGBA1C 6.1 (H) 10/07/2022      Assessment & Plan:   Problem List Items Addressed This Visit       Cardiovascular and Mediastinum   Essential hypertension   BP Readings from Last 1 Encounters:  08/01/23 119/77   Well-controlled with Telmisartan  20 mg QD Counseled for compliance with the medications Advised DASH diet and moderate exercise/walking, at least 150 mins/week        Respiratory   Asthma   Well-controlled with Wixela and PRN Airsupra         Digestive   IBS (irritable bowel syndrome)   She has alternating diarrhea and constipation On Lexapro  for MDD, has Valium for anxiety Colace PRN for constipation Plan to DC Metformin         Endocrine   Hypothyroidism   Lab Results  Component  Value Date   TSH 0.569 10/07/2022   Decreased dose of levothyroxine  to 50 mcg daily Symptoms had improved initially, but still has weight loss, although she takes Mounjaro , she has concern for oversupplementation of thyroid  Was on NP thyroid  90 mg QD Chart review suggests normal TSH in the past as well Check TSH and free T4      Relevant Orders   TSH  + free T4   Type 2 diabetes mellitus with other specified complication (HCC) - Primary   Lab Results  Component Value Date   HGBA1C 6.1 (H) 10/07/2022   Associated with HTN and HLD Well-controlled On Metformin  and Mounjaro  Plan to DC Metformin  based on HbA1c (due to worsening of IBS symptoms) Advised to follow diabetic diet On ARB now F/u CMP and lipid panel later Diabetic eye exam: Advised to follow up with Ophthalmology for diabetic eye exam      Relevant Medications   tirzepatide  (MOUNJARO ) 5 MG/0.5ML Pen   Other Relevant Orders   Microalbumin / creatinine urine ratio   CMP14+EGFR   Hemoglobin A1c     Other   Mixed hyperlipidemia   Discussed about statin in the last visit Checked lipid profile - LDL near goal without statin      Recurrent major depressive disorder (HCC)   Well-controlled She is on Lexapro  and Valium currently, followed [facility]      Other Visit Diagnoses       Long-term current use of injectable noninsulin antidiabetic medication         Encounter for immunization       Relevant Orders   Pneumococcal conjugate vaccine 20-valent (Completed)       Meds ordered this encounter  Medications   tirzepatide  (MOUNJARO ) 5 MG/0.5ML Pen    Sig: Inject 5 mg into the skin once a week.    Dispense:  2 mL    Refill:  5    Follow-up: Return in about 6 months (around 01/31/2024) for Annual physical.    Meldon Sport, MD

## 2023-08-01 NOTE — Assessment & Plan Note (Addendum)
 Lab Results  Component Value Date   HGBA1C 6.1 (H) 10/07/2022   Associated with HTN and HLD Well-controlled On Metformin  and Mounjaro  Plan to DC Metformin  based on HbA1c (due to worsening of IBS symptoms) Advised to follow diabetic diet On ARB now F/u CMP and lipid panel later Diabetic eye exam: Advised to follow up with Ophthalmology for diabetic eye exam

## 2023-08-01 NOTE — Assessment & Plan Note (Signed)
 BP Readings from Last 1 Encounters:  08/01/23 119/77   Well-controlled with Telmisartan  20 mg QD Counseled for compliance with the medications Advised DASH diet and moderate exercise/walking, at least 150 mins/week

## 2023-08-01 NOTE — Assessment & Plan Note (Addendum)
 Lab Results  Component Value Date   TSH 0.569 10/07/2022   Decreased dose of levothyroxine  to 50 mcg daily Symptoms had improved initially, but still has weight loss, although she takes Mounjaro , she has concern for oversupplementation of thyroid  Was on NP thyroid  90 mg QD Chart review suggests normal TSH in the past as well Check TSH and free T4

## 2023-08-01 NOTE — Assessment & Plan Note (Addendum)
 Discussed about statin in the last visit Checked lipid profile - LDL near goal without statin

## 2023-08-01 NOTE — Assessment & Plan Note (Signed)
 She has alternating diarrhea and constipation On Lexapro  for MDD, has Valium for anxiety Colace PRN for constipation Plan to DC Metformin 

## 2023-08-01 NOTE — Assessment & Plan Note (Addendum)
 Well-controlled She is on Lexapro  and Valium currently, followed by [facility]ry [facility]

## 2023-08-02 ENCOUNTER — Ambulatory Visit: Payer: Self-pay | Admitting: Internal Medicine

## 2023-08-02 ENCOUNTER — Other Ambulatory Visit: Payer: Self-pay | Admitting: Internal Medicine

## 2023-08-02 DIAGNOSIS — E559 Vitamin D deficiency, unspecified: Secondary | ICD-10-CM

## 2023-08-02 DIAGNOSIS — E782 Mixed hyperlipidemia: Secondary | ICD-10-CM

## 2023-08-02 DIAGNOSIS — I10 Essential (primary) hypertension: Secondary | ICD-10-CM

## 2023-08-02 DIAGNOSIS — E039 Hypothyroidism, unspecified: Secondary | ICD-10-CM

## 2023-08-02 DIAGNOSIS — E1169 Type 2 diabetes mellitus with other specified complication: Secondary | ICD-10-CM

## 2023-08-02 LAB — CMP14+EGFR
ALT: 21 IU/L (ref 0–32)
AST: 19 IU/L (ref 0–40)
Albumin: 5 g/dL — ABNORMAL HIGH (ref 3.8–4.9)
Alkaline Phosphatase: 80 IU/L (ref 44–121)
BUN/Creatinine Ratio: 16 (ref 9–23)
BUN: 10 mg/dL (ref 6–24)
Bilirubin Total: 0.3 mg/dL (ref 0.0–1.2)
CO2: 22 mmol/L (ref 20–29)
Calcium: 9.8 mg/dL (ref 8.7–10.2)
Chloride: 97 mmol/L (ref 96–106)
Creatinine, Ser: 0.61 mg/dL (ref 0.57–1.00)
Globulin, Total: 2.5 g/dL (ref 1.5–4.5)
Glucose: 85 mg/dL (ref 70–99)
Potassium: 4.4 mmol/L (ref 3.5–5.2)
Sodium: 137 mmol/L (ref 134–144)
Total Protein: 7.5 g/dL (ref 6.0–8.5)
eGFR: 106 mL/min/{1.73_m2} (ref >59)

## 2023-08-02 LAB — HEMOGLOBIN A1C
Est. average glucose Bld gHb Est-mCnc: 114 mg/dL
Hgb A1c MFr Bld: 5.6 % (ref 4.8–5.6)

## 2023-08-02 LAB — TSH+FREE T4
Free T4: 1.26 ng/dL (ref 0.82–1.77)
TSH: 0.527 u[IU]/mL (ref 0.450–4.500)

## 2023-08-02 MED ORDER — LEVOTHYROXINE SODIUM 25 MCG PO TABS: 25.0000 ug | ORAL_TABLET | Freq: Every day | ORAL | 1 refills | Status: DC

## 2023-08-02 NOTE — Addendum Note (Signed)
 Addended byCleola Dach on: 08/02/2023 08:01 AM   Modules accepted: Orders

## 2023-08-03 LAB — MICROALBUMIN / CREATININE URINE RATIO
Creatinine, Urine: 40.4 mg/dL
Microalb/Creat Ratio: 19 mg/g{creat} (ref 0–29)
Microalbumin, Urine: 7.6 ug/mL

## 2023-11-21 ENCOUNTER — Encounter: Payer: Self-pay | Admitting: Internal Medicine

## 2023-11-21 ENCOUNTER — Telehealth: Admitting: Internal Medicine

## 2023-11-21 DIAGNOSIS — J4531 Mild persistent asthma with (acute) exacerbation: Secondary | ICD-10-CM | POA: Diagnosis not present

## 2023-11-21 MED ORDER — METHYLPREDNISOLONE 4 MG PO TBPK: ORAL_TABLET | ORAL | 0 refills | Status: DC

## 2023-11-21 NOTE — Progress Notes (Signed)
 Virtual Visit via Video Note   Because of Gwendolyn [name]'s co-morbid illnesses, she is at least at moderate risk for complications without adequate follow up.  This format is felt to be most appropriate for this patient at this time.  All issues noted in this document were discussed and addressed.  A limited physical exam was performed with this format.      Evaluation Performed:  Follow-up visit  Date:  11/21/2023   ID:  Gwendolyn [name], DOB Jun 11, 1965, MRN [id number]  Patient Location: Home Provider Location: Office/Clinic  Participants: Patient Location of Patient: Home Location of Provider: Telehealth Consent was obtain for visit to be over via telehealth. I verified that I am speaking with the correct person using two identifiers.  PCP:  Tobie Suzzane POUR, MD   Chief Complaint: Cough and dyspnea  History of Present Illness:    Gwendolyn [name] is a 58 y.o. female who has a video visit for complaint of cough, chest congestion, dyspnea and wheezing for the last 10 days.  She had sinus pressure related headache initially.  Denies any fever or chills currently.  She has been using Wixela regularly and as needed Airsupra  for her asthma.  She has required Airsupra  more frequently in the last 1 week.  The patient does not have symptoms concerning for COVID-19 infection (fever, chills, cough, or new shortness of breath).   Past Medical, Surgical, Social History, Allergies, and Medications have been Reviewed.  Past Medical History:  Diagnosis Date   Asthma    Diabetes mellitus without complication (HCC)    History of TIAs    Palpitations    Polycystic ovarian disease    Sleep apnea    Tobacco abuse    Past Surgical History:  Procedure Laterality Date   BREAST BIOPSY Left    COLONOSCOPY WITH PROPOFOL  N/A 12/29/2022   Procedure: COLONOSCOPY WITH PROPOFOL ;  Surgeon: [name], MD;  [facility];  Service: Gastroenterology;  Laterality: N/A;   9:00AM;ASA 1   ENDOSCOPIC MUCOSAL RESECTION  12/29/2022   Procedure: ENDOSCOPIC MUCOSAL RESECTION;  Surgeon: [name], MD;  [facility]: [facility];  Service: Gastroenterology;;   HEMOSTASIS CLIP PLACEMENT  12/29/2022   Procedure: HEMOSTASIS CLIP PLACEMENT;  Surgeon: [name], MD;  [facility];  Service: Gastroenterology;;   POLYPECTOMY  12/29/2022   Procedure: POLYPECTOMY INTESTINAL;  Surgeon: [name], MD;  [facility];  Service: Gastroenterology;;   MATIAS  12/29/2022   Procedure: MATIAS;  Surgeon: [name], Sieving, MD;  [facility];  Service: Gastroenterology;;   ROBLEY LIFTING INJECTION  12/29/2022   Procedure: SUBMUCOSAL LIFTING INJECTION;  Surgeon: [name], Sieving, MD;  [facility];  Service: Gastroenterology;;   TUBAL LIGATION     TUMOR REMOVAL Left    leg     Current Meds  Medication Sig   Albuterol -Budesonide (AIRSUPRA ) 90-80 MCG/ACT AERO Inhale 2 puffs into the lungs every other day as needed.   diazepam (VALIUM) 5 MG tablet 10 mg.   EPINEPHrine  (EPIPEN  2-PAK) 0.3 mg/0.3 mL IJ SOAJ injection Inject 0.3 mg into the muscle as needed for anaphylaxis.   escitalopram  (LEXAPRO ) 20 MG tablet Take 20 mg by mouth daily.   levothyroxine  (SYNTHROID ) 25 MCG tablet Take 1 tablet (25 mcg total) by mouth daily.   metFORMIN  (GLUCOPHAGE -XR) 500 MG 24 hr tablet TAKE 1 TABLET BY MOUTH EVERY DAY WITH BREAKFAST   nitroGLYCERIN  (NITROSTAT ) 0.4  MG SL tablet Place 1 tablet (0.4 mg total) under the tongue every 5 (five) minutes as needed.   telmisartan  (MICARDIS ) 20 MG tablet TAKE 1 TABLET BY MOUTH EVERY DAY   tirzepatide  (MOUNJARO ) 5 MG/0.5ML Pen Inject 5 mg into the skin once a week.   WIXELA INHUB 100-50 MCG/ACT AEPB INHALE 1 PUFF INTO THE LUNGS TWICE A DAY     Allergies:   Lidocaine and Other   ROS:   Please see the history of present illness. All other  systems reviewed and are negative.   Labs/Other Tests and Data Reviewed:    Recent Labs: 08/01/2023: ALT 21; BUN 10; Creatinine, Ser 0.61; Potassium 4.4; Sodium 137; TSH 0.527   Recent Lipid Panel Lab Results  Component Value Date/Time   CHOL 178 10/07/2022 03:57 PM   TRIG 69 10/07/2022 03:57 PM   HDL 89 10/07/2022 03:57 PM   CHOLHDL 2.0 10/07/2022 03:57 PM   CHOLHDL 2.5 09/03/2019 02:47 PM   LDLCALC 76 10/07/2022 03:57 PM   LDLCALC 79 09/03/2019 02:47 PM    Wt Readings from Last 3 Encounters:  08/01/23 130 lb 12.8 oz (59.3 kg)  12/29/22 134 lb (60.8 kg)  12/09/22 135 lb 12.8 oz (61.6 kg)     Objective:    Vital Signs:  LMP 03/30/2011    VITAL SIGNS:  reviewed GEN:  no acute distress EYES:  sclerae anicteric, EOMI - Extraocular Movements Intact RESPIRATORY:  normal respiratory effort, symmetric expansion NEURO:  alert and oriented x 3, no obvious focal deficit PSYCH:  normal affect  ASSESSMENT & PLAN:    Mild persistent asthma with acute exacerbation Her symptoms are likely related to asthma exacerbation Started Medrol  Dosepak Continue Wixela regularly and as needed Airsupra  Advised to take Mucinex or Robitussin as needed for cough If persistent symptoms, may consider antibiotic  I discussed the assessment and treatment plan with the patient. The patient was provided an opportunity to ask questions, and all were answered. The patient agreed with the plan and demonstrated an understanding of the instructions.   The patient was advised to call back or seek an in-person evaluation if the symptoms worsen or if the condition fails to improve as anticipated.  The above assessment and management plan was discussed with the patient. The patient verbalized understanding of and has agreed to the management plan.   Medication Adjustments/Labs and Tests Ordered: Current medicines are reviewed at length with the patient today.  Concerns regarding medicines are outlined above.    Tests Ordered: No orders of the defined types were placed in this encounter.   Medication Changes: No orders of the defined types were placed in this encounter.    Note: This dictation was prepared with Dragon dictation along with smaller phrase technology. Similar sounding words can be transcribed inadequately or may not be corrected upon review. Any transcriptional errors that result from this process are unintentional.      Disposition:  Follow up  Signed, Suzzane MARLA Blanch, MD  11/21/2023 11:48 AM     [facility]

## 2023-11-21 NOTE — Telephone Encounter (Signed)
 Pt added as video visit at 11:40am

## 2023-11-21 NOTE — Patient Instructions (Signed)
 Please start taking prednisone  as prescribed.  Please continue using Wixela regularly and Airsupra  as needed for shortness of breath or wheezing.  Please take Mucinex or Robitussin as needed for cough.

## 2023-11-24 ENCOUNTER — Other Ambulatory Visit: Payer: Self-pay | Admitting: Internal Medicine

## 2023-11-24 DIAGNOSIS — J209 Acute bronchitis, unspecified: Secondary | ICD-10-CM

## 2023-11-24 MED ORDER — AZITHROMYCIN 250 MG PO TABS: ORAL_TABLET | ORAL | 0 refills | Status: AC

## 2023-11-27 ENCOUNTER — Other Ambulatory Visit: Payer: Self-pay | Admitting: Internal Medicine

## 2023-11-27 DIAGNOSIS — I10 Essential (primary) hypertension: Secondary | ICD-10-CM

## 2023-11-30 ENCOUNTER — Encounter (INDEPENDENT_AMBULATORY_CARE_PROVIDER_SITE_OTHER): Payer: Self-pay | Admitting: Gastroenterology

## 2024-01-17 ENCOUNTER — Other Ambulatory Visit: Payer: Self-pay | Admitting: Internal Medicine

## 2024-01-17 DIAGNOSIS — E039 Hypothyroidism, unspecified: Secondary | ICD-10-CM

## 2024-01-20 DIAGNOSIS — F411 Generalized anxiety disorder: Secondary | ICD-10-CM | POA: Diagnosis not present

## 2024-01-20 DIAGNOSIS — F410 Panic disorder [episodic paroxysmal anxiety] without agoraphobia: Secondary | ICD-10-CM | POA: Diagnosis not present

## 2024-01-31 ENCOUNTER — Ambulatory Visit (INDEPENDENT_AMBULATORY_CARE_PROVIDER_SITE_OTHER): Admitting: Internal Medicine

## 2024-01-31 ENCOUNTER — Encounter: Payer: Self-pay | Admitting: Internal Medicine

## 2024-01-31 VITALS — BP 126/80 | HR 78 | Ht 66.0 in | Wt 130.6 lb

## 2024-01-31 DIAGNOSIS — I10 Essential (primary) hypertension: Secondary | ICD-10-CM | POA: Diagnosis not present

## 2024-01-31 DIAGNOSIS — E559 Vitamin D deficiency, unspecified: Secondary | ICD-10-CM | POA: Diagnosis not present

## 2024-01-31 DIAGNOSIS — E039 Hypothyroidism, unspecified: Secondary | ICD-10-CM

## 2024-01-31 DIAGNOSIS — E1169 Type 2 diabetes mellitus with other specified complication: Secondary | ICD-10-CM

## 2024-01-31 DIAGNOSIS — Z7985 Long-term (current) use of injectable non-insulin antidiabetic drugs: Secondary | ICD-10-CM

## 2024-01-31 DIAGNOSIS — Z0001 Encounter for general adult medical examination with abnormal findings: Secondary | ICD-10-CM

## 2024-01-31 DIAGNOSIS — E782 Mixed hyperlipidemia: Secondary | ICD-10-CM

## 2024-01-31 DIAGNOSIS — M79632 Pain in left forearm: Secondary | ICD-10-CM | POA: Diagnosis not present

## 2024-01-31 NOTE — Assessment & Plan Note (Signed)
 Discussed about statin in the last visit Checked lipid profile - LDL near goal without statin

## 2024-01-31 NOTE — Assessment & Plan Note (Addendum)
 Lab Results  Component Value Date   HGBA1C 5.6 08/01/2023   Associated with HTN and HLD Well-controlled On Mounjaro  5 mg qw Had worsening of IBS symptoms with metformin  Advised to follow diabetic diet On ARB now F/u CMP and lipid panel later Diabetic eye exam: Advised to follow up with Ophthalmology for diabetic eye exam

## 2024-01-31 NOTE — Patient Instructions (Signed)
 Please continue to take medications as prescribed.  Please continue to follow low carb diet and perform moderate exercise/walking at least 150 mins/week.

## 2024-01-31 NOTE — Progress Notes (Signed)
 Established Patient Office Visit  Subjective:  Patient ID: Gwendolyn [name], female    DOB: 1965-03-31  Age: 58 y.o. MRN: [id number]  CC:  Chief Complaint  Patient presents with   Annual Exam    CPE and 6 month f/u    Arm Pain    Left arm pain thinks she pulled a muscle ongoing for 3 months.    HPI Gwendolyn [name] is a 58 y.o. female with past medical history of HTN, asthma, type II DM, hypothyroidism and tobacco abuse who presents for annual physical.  Type II DM: She currently takes Mounjaro  5 mg QW.  She denies any nausea or vomiting with Mounjaro  now. Her HbA1c had improved to 6.1 in 08/24. She denies any polyuria or polyphagia currently.  Hypothyroidism: She was having jitteriness, tremors and palpitations, which have improved since decreasing dose of levothyroxine  to 25 mcg.  She still has mild tremors of bilateral hands.  She prefers to discontinue levothyroxine .  Her thyroid  function testing showed oversupplementation in 01/24, but improved later after decreasing dose of levothyroxine . Denies any recent change in weight or appetite.   Asthma: She uses Wixela currently.  She uses Airsupra  as needed for dyspnea or wheezing.  Denies any recent asthma flareup.  She has left forearm pain near the elbow, which is intermittent for the last 3 months.  Her pain is worse with lifting with left hand and elbow movement.  Denies any recent injury, but attributes it to heavy lifting with the left hand prior to the onset of her pain.  Denies any numbness or tingling of the UE.   Past Medical History:  Diagnosis Date   Asthma    Diabetes mellitus without complication (HCC)    History of TIAs    Palpitations    Polycystic ovarian disease    Sleep apnea    Tobacco abuse     Past Surgical History:  Procedure Laterality Date   BREAST BIOPSY Left    COLONOSCOPY WITH PROPOFOL  N/A 12/29/2022   Procedure: COLONOSCOPY WITH PROPOFOL ;  Surgeon: [name], MD;  [facility];  Service: Gastroenterology;  Laterality: N/A;  9:00AM;ASA 1   ENDOSCOPIC MUCOSAL RESECTION  12/29/2022   Procedure: ENDOSCOPIC MUCOSAL RESECTION;  Surgeon: [name], MD;  [facility];  Service: Gastroenterology;;   HEMOSTASIS CLIP PLACEMENT  12/29/2022   Procedure: HEMOSTASIS CLIP PLACEMENT;  Surgeon: [name], MD;  [facility];  Service: Gastroenterology;;   POLYPECTOMY  12/29/2022   Procedure: POLYPECTOMY INTESTINAL;  Surgeon: [name], MD;  [facility];  Service: Gastroenterology;;   MATIAS  12/29/2022   Procedure: MATIAS;  Surgeon: [name], Sieving, MD;  [facility];  Service: Gastroenterology;;   ROBLEY LIFTING INJECTION  12/29/2022   Procedure: SUBMUCOSAL LIFTING INJECTION;  Surgeon: [name], MD;  [facility];  Service: Gastroenterology;;   TUBAL LIGATION     TUMOR REMOVAL Left    leg    Family History  Problem Relation Age of Onset   Hypertension Mother    Diabetes Father    Colon polyps Father    Melanoma Father 21   Diabetes Brother    Breast cancer Maternal Grandmother 12   Colon polyps Paternal Grandmother    Breast cancer Other 37 - 29       paternal grandfather's sister   Pancreatic cancer Paternal Great-grandmother        paternal grandmother's mother  Social History   Socioeconomic History   Marital status: Married    Spouse name: Not on file   Number of children: Not on file   Years of education: Not on file   Highest education level: Not on file  Occupational History   Not on file  Tobacco Use   Smoking status: Every Day    Current packs/day: 0.25    Average packs/day: 0.3 packs/day for 20.0 years (5.0 ttl pk-yrs)    Types: Cigarettes   Smokeless tobacco: Never  Vaping Use   Vaping status: Former  Substance and Sexual Activity   Alcohol use: Yes    Alcohol/week: 14.0 standard drinks  of alcohol    Types: 14 Glasses of wine per week    Comment: couple glasses per day   Drug use: No   Sexual activity: Not Currently    Birth control/protection: Post-menopausal  Other Topics Concern   Not on file  Social History Narrative   Exercises rarely. Smokes tobacco. Works in a [employer]. Lives in [place] . Eats meat fruit and vegetables. Married for 18 years,lives with husband.   Social Drivers of Health   Tobacco Use: High Risk (01/31/2024)   Patient History    Smoking Tobacco Use: Every Day    Smokeless Tobacco Use: Never    Passive Exposure: Not on file  Financial Resource Strain: Not on file  Food Insecurity: Not on file  Transportation Needs: Not on file  Physical Activity: Not on file  Stress: Not on file  Social Connections: Not on file  Intimate Partner Violence: Not on file  Depression (PHQ2-9): Low Risk (01/31/2024)   Depression (PHQ2-9)    PHQ-2 Score: 0  Alcohol Screen: Not on file  Housing: Not on file  Utilities: Not on file  Health Literacy: Not on file    Outpatient Medications Prior to Visit  Medication Sig Dispense Refill   Albuterol -Budesonide (AIRSUPRA ) 90-80 MCG/ACT AERO Inhale 2 puffs into the lungs every other day as needed. 33 g 2   diazepam (VALIUM) 5 MG tablet 10 mg.     EPINEPHrine  (EPIPEN  2-PAK) 0.3 mg/0.3 mL IJ SOAJ injection Inject 0.3 mg into the muscle as needed for anaphylaxis. 1 each 1   escitalopram  (LEXAPRO ) 20 MG tablet Take 20 mg by mouth daily.     levothyroxine  (SYNTHROID ) 25 MCG tablet TAKE 1 TABLET BY MOUTH EVERY DAY 90 tablet 1   nitroGLYCERIN  (NITROSTAT ) 0.4 MG SL tablet Place 1 tablet (0.4 mg total) under the tongue every 5 (five) minutes as needed. 25 tablet 3   telmisartan  (MICARDIS ) 20 MG tablet TAKE 1 TABLET BY MOUTH EVERY DAY 90 tablet 1   tirzepatide  (MOUNJARO ) 5 MG/0.5ML Pen Inject 5 mg into the skin once a week. 2 mL 5   WIXELA INHUB 100-50 MCG/ACT AEPB  INHALE 1 PUFF INTO THE LUNGS TWICE A DAY 180 each 2   metFORMIN  (GLUCOPHAGE -XR) 500 MG 24 hr tablet TAKE 1 TABLET BY MOUTH EVERY DAY WITH BREAKFAST 90 tablet 1   methylPREDNISolone  (MEDROL  DOSEPAK) 4 MG TBPK tablet Take as package instructions. 1 each 0   No facility-administered medications prior to visit.    Allergies  Allergen Reactions   Lidocaine Anaphylaxis, Other (See Comments) and Nausea And Vomiting   Other Hives and Rash    SEA MOSS    ROS Review of Systems  Constitutional:  Positive for fatigue. Negative for chills and fever.  HENT:  Negative for congestion, postnasal drip, sinus pressure,  sinus pain and sore throat.   Eyes:  Negative for pain and discharge.  Respiratory:  Negative for cough and shortness of breath.   Cardiovascular:  Negative for chest pain and palpitations.  Gastrointestinal:  Negative for abdominal pain, diarrhea, nausea and vomiting.  Endocrine: Positive for heat intolerance. Negative for polydipsia and polyuria.  Genitourinary:  Negative for dysuria and hematuria.  Musculoskeletal:  Negative for neck pain and neck stiffness.  Skin:  Negative for rash.  Neurological:  Positive for tremors. Negative for dizziness and weakness.  Psychiatric/Behavioral:  Negative for agitation and behavioral problems.       Objective:    Physical Exam Vitals reviewed.  Constitutional:      General: She is not in acute distress.    Appearance: She is not diaphoretic.  HENT:     Head: Normocephalic and atraumatic.     Nose: No congestion.     Mouth/Throat:     Mouth: Mucous membranes are moist.  Eyes:     General: No scleral icterus.    Extraocular Movements: Extraocular movements intact.  Cardiovascular:     Rate and Rhythm: Normal rate and regular rhythm.     Pulses: Normal pulses.     Heart sounds: Normal heart sounds. No murmur heard. Pulmonary:     Breath sounds: Normal breath sounds. No wheezing or rales.  Abdominal:     Palpations: Abdomen is  soft.     Tenderness: There is no abdominal tenderness.  Musculoskeletal:     Cervical back: Neck supple. No tenderness.     Right lower leg: No edema.     Left lower leg: No edema.  Skin:    General: Skin is warm.     Findings: No rash.  Neurological:     General: No focal deficit present.     Mental Status: She is alert and oriented to person, place, and time.     Cranial Nerves: No cranial nerve deficit.     Sensory: No sensory deficit.     Motor: Tremor (L > R hand) present. No weakness.  Psychiatric:        Mood and Affect: Mood normal.        Behavior: Behavior normal.     BP 126/80 (BP Location: Right Arm)   Pulse 78   Ht 5' 6 (1.676 m)   Wt 130 lb 9.6 oz (59.2 kg)   LMP 03/30/2011   SpO2 94%   BMI 21.08 kg/m  Wt Readings from Last 3 Encounters:  01/31/24 130 lb 9.6 oz (59.2 kg)  08/01/23 130 lb 12.8 oz (59.3 kg)  12/29/22 134 lb (60.8 kg)    Lab Results  Component Value Date   TSH 0.527 08/01/2023   Lab Results  Component Value Date   WBC 6.5 02/23/2022   HGB 15.6 02/23/2022   HCT 45.5 02/23/2022   MCV 97 02/23/2022   PLT 263 02/23/2022   Lab Results  Component Value Date   NA 137 08/01/2023   K 4.4 08/01/2023   CO2 22 08/01/2023   GLUCOSE 85 08/01/2023   BUN 10 08/01/2023   CREATININE 0.61 08/01/2023   BILITOT 0.3 08/01/2023   ALKPHOS 80 08/01/2023   AST 19 08/01/2023   ALT 21 08/01/2023   PROT 7.5 08/01/2023   ALBUMIN 5.0 (H) 08/01/2023   CALCIUM 9.8 08/01/2023   EGFR 106 08/01/2023   GFR 105.90 04/22/2010   Lab Results  Component Value Date   CHOL 178 10/07/2022   Lab  Results  Component Value Date   HDL 89 10/07/2022   Lab Results  Component Value Date   LDLCALC 76 10/07/2022   Lab Results  Component Value Date   TRIG 69 10/07/2022   Lab Results  Component Value Date   CHOLHDL 2.0 10/07/2022   Lab Results  Component Value Date   HGBA1C 5.6 08/01/2023      Assessment & Plan:   Problem List Items Addressed This  Visit       Cardiovascular and Mediastinum   Essential hypertension   BP Readings from Last 1 Encounters:  01/31/24 126/80   Well-controlled with Telmisartan  20 mg QD Counseled for compliance with the medications Advised DASH diet and moderate exercise/walking, at least 150 mins/week      Relevant Orders   CMP14+EGFR   CBC with Differential/Platelet     Endocrine   Hypothyroidism   Lab Results  Component Value Date   TSH 0.527 08/01/2023   Decreased dose of levothyroxine  to 25 mcg daily in the last visit Symptoms had improved initially, she has concern for oversupplementation of thyroid  Was on NP thyroid  90 mg QD Chart review suggests normal TSH in the past prior to starting NP thyroid  by previous PCP Check TSH and free T4      Relevant Orders   TSH + free T4   Type 2 diabetes mellitus with other specified complication (HCC)   Lab Results  Component Value Date   HGBA1C 5.6 08/01/2023   Associated with HTN and HLD Well-controlled On Mounjaro  5 mg qw Had worsening of IBS symptoms with metformin  Advised to follow diabetic diet On ARB now F/u CMP and lipid panel later Diabetic eye exam: Advised to follow up with Ophthalmology for diabetic eye exam      Relevant Orders   Hemoglobin A1c   CMP14+EGFR     Other   Mixed hyperlipidemia   Discussed about statin in the last visit Checked lipid profile - LDL near goal without statin      Relevant Orders   Lipid panel   Left forearm pain   Likely due to pronator tendinitis Advised to apply warm compresses and IcyHot/Bengay for local relief If persistent, will refer to orthopedic surgery      Encounter for general adult medical examination with abnormal findings - Primary   Physical exam as documented. Fasting blood tests today. Mammography and Pap smear with OB/GYN - Dr. Mat. Prefers to wait for Shingrix  #2 and Tdap vaccines for now.      Other Visit Diagnoses       Vitamin D  deficiency       Relevant  Orders   VITAMIN D  25 Hydroxy (Vit-D Deficiency, Fractures)       No orders of the defined types were placed in this encounter.   Follow-up: Return in about 6 months (around 07/31/2024) for DM and hypothyroidism.    Suzzane MARLA Blanch, MD

## 2024-01-31 NOTE — Assessment & Plan Note (Signed)
 BP Readings from Last 1 Encounters:  01/31/24 126/80   Well-controlled with Telmisartan  20 mg QD Counseled for compliance with the medications Advised DASH diet and moderate exercise/walking, at least 150 mins/week

## 2024-01-31 NOTE — Assessment & Plan Note (Signed)
 Likely due to pronator tendinitis Advised to apply warm compresses and IcyHot/Bengay for local relief If persistent, will refer to orthopedic surgery

## 2024-01-31 NOTE — Assessment & Plan Note (Addendum)
 Lab Results  Component Value Date   TSH 0.527 08/01/2023   Decreased dose of levothyroxine  to 25 mcg daily in the last visit Symptoms had improved initially, she has concern for oversupplementation of thyroid  Was on NP thyroid  90 mg QD Chart review suggests normal TSH in the past prior to starting NP thyroid  by previous PCP Check TSH and free T4

## 2024-01-31 NOTE — Assessment & Plan Note (Signed)
 Physical exam as documented. Fasting blood tests today. Mammography and Pap smear with OB/GYN - Dr. Mat. Prefers to wait for Shingrix  #2 and Tdap vaccines for now.

## 2024-02-01 ENCOUNTER — Ambulatory Visit: Payer: Self-pay | Admitting: Internal Medicine

## 2024-02-01 LAB — CBC WITH DIFFERENTIAL/PLATELET
Basophils Absolute: 0.1 x10E3/uL (ref 0.0–0.2)
Basos: 1 %
EOS (ABSOLUTE): 0.1 x10E3/uL (ref 0.0–0.4)
Eos: 2 %
Hematocrit: 43 % (ref 34.0–46.6)
Hemoglobin: 14.3 g/dL (ref 11.1–15.9)
Immature Grans (Abs): 0 x10E3/uL (ref 0.0–0.1)
Immature Granulocytes: 0 %
Lymphocytes Absolute: 2.6 x10E3/uL (ref 0.7–3.1)
Lymphs: 45 %
MCH: 32.9 pg (ref 26.6–33.0)
MCHC: 33.3 g/dL (ref 31.5–35.7)
MCV: 99 fL — ABNORMAL HIGH (ref 79–97)
Monocytes Absolute: 0.5 x10E3/uL (ref 0.1–0.9)
Monocytes: 8 %
Neutrophils Absolute: 2.5 x10E3/uL (ref 1.4–7.0)
Neutrophils: 44 %
Platelets: 250 x10E3/uL (ref 150–450)
RBC: 4.34 x10E6/uL (ref 3.77–5.28)
RDW: 13.2 % (ref 11.7–15.4)
WBC: 5.7 x10E3/uL (ref 3.4–10.8)

## 2024-02-01 LAB — LIPID PANEL
Chol/HDL Ratio: 2 ratio (ref 0.0–4.4)
Cholesterol, Total: 187 mg/dL (ref 100–199)
HDL: 95 mg/dL (ref >39)
LDL Chol Calc (NIH): 74 mg/dL (ref 0–99)
Triglycerides: 105 mg/dL (ref 0–149)
VLDL Cholesterol Cal: 18 mg/dL (ref 5–40)

## 2024-02-01 LAB — CMP14+EGFR
ALT: 24 IU/L (ref 0–32)
AST: 25 IU/L (ref 0–40)
Albumin: 4.8 g/dL (ref 3.8–4.9)
Alkaline Phosphatase: 80 IU/L (ref 49–135)
BUN/Creatinine Ratio: 21 (ref 9–23)
BUN: 12 mg/dL (ref 6–24)
Bilirubin Total: 0.2 mg/dL (ref 0.0–1.2)
CO2: 24 mmol/L (ref 20–29)
Calcium: 9.8 mg/dL (ref 8.7–10.2)
Chloride: 102 mmol/L (ref 96–106)
Creatinine, Ser: 0.58 mg/dL (ref 0.57–1.00)
Globulin, Total: 2.3 g/dL (ref 1.5–4.5)
Glucose: 97 mg/dL (ref 70–99)
Potassium: 4.8 mmol/L (ref 3.5–5.2)
Sodium: 142 mmol/L (ref 134–144)
Total Protein: 7.1 g/dL (ref 6.0–8.5)
eGFR: 106 mL/min/1.73 (ref >59)

## 2024-02-01 LAB — VITAMIN D 25 HYDROXY (VIT D DEFICIENCY, FRACTURES): Vit D, 25-Hydroxy: 52.7 ng/mL (ref 30.0–100.0)

## 2024-02-01 LAB — HEMOGLOBIN A1C
Est. average glucose Bld gHb Est-mCnc: 117 mg/dL
Hgb A1c MFr Bld: 5.7 % — ABNORMAL HIGH (ref 4.8–5.6)

## 2024-02-01 LAB — TSH+FREE T4
Free T4: 1.13 ng/dL (ref 0.82–1.77)
TSH: 0.579 u[IU]/mL (ref 0.450–4.500)

## 2024-02-08 ENCOUNTER — Other Ambulatory Visit: Payer: Self-pay | Admitting: Internal Medicine

## 2024-02-08 DIAGNOSIS — E1169 Type 2 diabetes mellitus with other specified complication: Secondary | ICD-10-CM

## 2024-03-06 ENCOUNTER — Other Ambulatory Visit: Payer: Self-pay | Admitting: Internal Medicine

## 2024-03-06 DIAGNOSIS — J4530 Mild persistent asthma, uncomplicated: Secondary | ICD-10-CM

## 2024-07-31 ENCOUNTER — Ambulatory Visit: Payer: Self-pay | Admitting: Internal Medicine
